# Patient Record
Sex: Female | Born: 1940 | Race: White | Hispanic: No | Smoking: Former smoker
Health system: Southern US, Community
[De-identification: ages and names within clinical notes are randomized; demographics above are authoritative.]

## PROBLEM LIST (undated history)

## (undated) DIAGNOSIS — C801 Malignant (primary) neoplasm, unspecified: Secondary | ICD-10-CM

## (undated) DIAGNOSIS — E119 Type 2 diabetes mellitus without complications: Secondary | ICD-10-CM

## (undated) DIAGNOSIS — Z950 Presence of cardiac pacemaker: Secondary | ICD-10-CM

## (undated) DIAGNOSIS — I495 Sick sinus syndrome: Secondary | ICD-10-CM

## (undated) DIAGNOSIS — F419 Anxiety disorder, unspecified: Secondary | ICD-10-CM

## (undated) DIAGNOSIS — R42 Dizziness and giddiness: Secondary | ICD-10-CM

## (undated) DIAGNOSIS — E079 Disorder of thyroid, unspecified: Secondary | ICD-10-CM

## (undated) DIAGNOSIS — B019 Varicella without complication: Secondary | ICD-10-CM

## (undated) DIAGNOSIS — I480 Paroxysmal atrial fibrillation: Secondary | ICD-10-CM

## (undated) DIAGNOSIS — E039 Hypothyroidism, unspecified: Secondary | ICD-10-CM

## (undated) DIAGNOSIS — E785 Hyperlipidemia, unspecified: Secondary | ICD-10-CM

## (undated) DIAGNOSIS — I10 Essential (primary) hypertension: Secondary | ICD-10-CM

## (undated) DIAGNOSIS — E878 Other disorders of electrolyte and fluid balance, not elsewhere classified: Secondary | ICD-10-CM

## (undated) HISTORY — DX: Essential (primary) hypertension: I10

## (undated) HISTORY — DX: Disorder of thyroid, unspecified: E07.9

## (undated) HISTORY — PX: JOINT REPLACEMENT: SHX530

## (undated) HISTORY — PX: TONSILLECTOMY: SUR1361

## (undated) HISTORY — DX: Paroxysmal atrial fibrillation: I48.0

## (undated) HISTORY — DX: Sick sinus syndrome: I49.5

## (undated) HISTORY — DX: Presence of cardiac pacemaker: Z95.0

---
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
## 2000-12-15 HISTORY — PX: DILATION AND CURETTAGE OF UTERUS: SHX78

## 2006-08-28 DIAGNOSIS — N3946 Mixed incontinence: Secondary | ICD-10-CM | POA: Insufficient documentation

## 2006-08-28 DIAGNOSIS — E1169 Type 2 diabetes mellitus with other specified complication: Secondary | ICD-10-CM | POA: Insufficient documentation

## 2006-08-28 DIAGNOSIS — E7800 Pure hypercholesterolemia, unspecified: Secondary | ICD-10-CM | POA: Insufficient documentation

## 2006-08-28 DIAGNOSIS — I10 Essential (primary) hypertension: Secondary | ICD-10-CM | POA: Insufficient documentation

## 2007-09-15 DIAGNOSIS — L989 Disorder of the skin and subcutaneous tissue, unspecified: Secondary | ICD-10-CM | POA: Insufficient documentation

## 2007-12-10 DIAGNOSIS — G4700 Insomnia, unspecified: Secondary | ICD-10-CM | POA: Insufficient documentation

## 2008-09-18 DIAGNOSIS — C4492 Squamous cell carcinoma of skin, unspecified: Secondary | ICD-10-CM

## 2008-09-18 HISTORY — DX: Squamous cell carcinoma of skin, unspecified: C44.92

## 2008-12-15 HISTORY — PX: THYROIDECTOMY: SHX17

## 2008-12-28 DIAGNOSIS — E041 Nontoxic single thyroid nodule: Secondary | ICD-10-CM | POA: Insufficient documentation

## 2009-01-03 ENCOUNTER — Ambulatory Visit: Payer: Self-pay | Admitting: Unknown Physician Specialty

## 2009-01-18 ENCOUNTER — Ambulatory Visit: Payer: Self-pay | Admitting: Unknown Physician Specialty

## 2009-03-05 ENCOUNTER — Ambulatory Visit: Payer: Self-pay | Admitting: Unknown Physician Specialty

## 2009-03-13 ENCOUNTER — Ambulatory Visit: Payer: Self-pay | Admitting: Unknown Physician Specialty

## 2010-02-20 LAB — HM PAP SMEAR: HM PAP: NORMAL

## 2010-04-10 DIAGNOSIS — R202 Paresthesia of skin: Secondary | ICD-10-CM | POA: Insufficient documentation

## 2010-11-21 ENCOUNTER — Ambulatory Visit: Payer: Self-pay

## 2010-11-27 ENCOUNTER — Ambulatory Visit: Payer: Self-pay | Admitting: Family Medicine

## 2011-10-14 ENCOUNTER — Ambulatory Visit: Payer: Self-pay | Admitting: Unknown Physician Specialty

## 2011-10-14 LAB — HM COLONOSCOPY

## 2011-10-16 LAB — PATHOLOGY REPORT

## 2012-02-03 DIAGNOSIS — D239 Other benign neoplasm of skin, unspecified: Secondary | ICD-10-CM | POA: Diagnosis not present

## 2012-02-03 DIAGNOSIS — L570 Actinic keratosis: Secondary | ICD-10-CM | POA: Diagnosis not present

## 2012-02-03 DIAGNOSIS — Z85828 Personal history of other malignant neoplasm of skin: Secondary | ICD-10-CM | POA: Diagnosis not present

## 2012-02-03 DIAGNOSIS — L821 Other seborrheic keratosis: Secondary | ICD-10-CM | POA: Diagnosis not present

## 2012-03-01 DIAGNOSIS — R209 Unspecified disturbances of skin sensation: Secondary | ICD-10-CM | POA: Diagnosis not present

## 2012-03-01 DIAGNOSIS — E7800 Pure hypercholesterolemia, unspecified: Secondary | ICD-10-CM | POA: Diagnosis not present

## 2012-03-01 DIAGNOSIS — I10 Essential (primary) hypertension: Secondary | ICD-10-CM | POA: Diagnosis not present

## 2012-03-01 DIAGNOSIS — Z0000 Encounter for general adult medical examination without abnormal findings: Secondary | ICD-10-CM | POA: Diagnosis not present

## 2012-03-03 DIAGNOSIS — D126 Benign neoplasm of colon, unspecified: Secondary | ICD-10-CM | POA: Diagnosis not present

## 2012-03-03 DIAGNOSIS — E7800 Pure hypercholesterolemia, unspecified: Secondary | ICD-10-CM | POA: Diagnosis not present

## 2012-03-03 DIAGNOSIS — E041 Nontoxic single thyroid nodule: Secondary | ICD-10-CM | POA: Diagnosis not present

## 2012-03-03 DIAGNOSIS — R209 Unspecified disturbances of skin sensation: Secondary | ICD-10-CM | POA: Diagnosis not present

## 2012-04-08 DIAGNOSIS — Z1231 Encounter for screening mammogram for malignant neoplasm of breast: Secondary | ICD-10-CM | POA: Diagnosis not present

## 2012-04-08 DIAGNOSIS — E2839 Other primary ovarian failure: Secondary | ICD-10-CM | POA: Diagnosis not present

## 2012-04-08 DIAGNOSIS — Z79899 Other long term (current) drug therapy: Secondary | ICD-10-CM | POA: Diagnosis not present

## 2012-04-08 DIAGNOSIS — N951 Menopausal and female climacteric states: Secondary | ICD-10-CM | POA: Diagnosis not present

## 2012-04-08 LAB — HM MAMMOGRAPHY: HM MAMMO: NORMAL

## 2012-04-23 DIAGNOSIS — E041 Nontoxic single thyroid nodule: Secondary | ICD-10-CM | POA: Diagnosis not present

## 2012-05-20 DIAGNOSIS — L089 Local infection of the skin and subcutaneous tissue, unspecified: Secondary | ICD-10-CM | POA: Diagnosis not present

## 2012-05-20 DIAGNOSIS — R21 Rash and other nonspecific skin eruption: Secondary | ICD-10-CM | POA: Diagnosis not present

## 2012-05-20 DIAGNOSIS — L820 Inflamed seborrheic keratosis: Secondary | ICD-10-CM | POA: Diagnosis not present

## 2012-06-03 DIAGNOSIS — H2510 Age-related nuclear cataract, unspecified eye: Secondary | ICD-10-CM | POA: Diagnosis not present

## 2012-08-09 DIAGNOSIS — R209 Unspecified disturbances of skin sensation: Secondary | ICD-10-CM | POA: Diagnosis not present

## 2012-08-09 DIAGNOSIS — Z0000 Encounter for general adult medical examination without abnormal findings: Secondary | ICD-10-CM | POA: Diagnosis not present

## 2012-08-09 DIAGNOSIS — M25569 Pain in unspecified knee: Secondary | ICD-10-CM | POA: Diagnosis not present

## 2012-08-09 DIAGNOSIS — R7309 Other abnormal glucose: Secondary | ICD-10-CM | POA: Diagnosis not present

## 2012-08-18 DIAGNOSIS — M25569 Pain in unspecified knee: Secondary | ICD-10-CM | POA: Diagnosis not present

## 2012-08-18 DIAGNOSIS — IMO0002 Reserved for concepts with insufficient information to code with codable children: Secondary | ICD-10-CM | POA: Diagnosis not present

## 2012-08-18 DIAGNOSIS — M1710 Unilateral primary osteoarthritis, unspecified knee: Secondary | ICD-10-CM | POA: Diagnosis not present

## 2012-08-18 DIAGNOSIS — I10 Essential (primary) hypertension: Secondary | ICD-10-CM | POA: Diagnosis not present

## 2012-08-18 DIAGNOSIS — M238X9 Other internal derangements of unspecified knee: Secondary | ICD-10-CM | POA: Diagnosis not present

## 2012-08-30 DIAGNOSIS — L738 Other specified follicular disorders: Secondary | ICD-10-CM | POA: Diagnosis not present

## 2012-09-01 DIAGNOSIS — R209 Unspecified disturbances of skin sensation: Secondary | ICD-10-CM | POA: Diagnosis not present

## 2012-09-01 DIAGNOSIS — Z23 Encounter for immunization: Secondary | ICD-10-CM | POA: Diagnosis not present

## 2012-09-01 DIAGNOSIS — Z0000 Encounter for general adult medical examination without abnormal findings: Secondary | ICD-10-CM | POA: Diagnosis not present

## 2012-09-14 DIAGNOSIS — I10 Essential (primary) hypertension: Secondary | ICD-10-CM | POA: Diagnosis not present

## 2012-09-14 DIAGNOSIS — M6580 Other synovitis and tenosynovitis, unspecified site: Secondary | ICD-10-CM | POA: Diagnosis not present

## 2012-09-14 DIAGNOSIS — M238X9 Other internal derangements of unspecified knee: Secondary | ICD-10-CM | POA: Diagnosis not present

## 2012-09-14 DIAGNOSIS — M25569 Pain in unspecified knee: Secondary | ICD-10-CM | POA: Diagnosis not present

## 2012-11-01 DIAGNOSIS — M25569 Pain in unspecified knee: Secondary | ICD-10-CM | POA: Diagnosis not present

## 2012-11-01 DIAGNOSIS — IMO0002 Reserved for concepts with insufficient information to code with codable children: Secondary | ICD-10-CM | POA: Diagnosis not present

## 2012-11-01 DIAGNOSIS — I10 Essential (primary) hypertension: Secondary | ICD-10-CM | POA: Diagnosis not present

## 2012-11-01 DIAGNOSIS — M1710 Unilateral primary osteoarthritis, unspecified knee: Secondary | ICD-10-CM | POA: Diagnosis not present

## 2012-11-01 DIAGNOSIS — M238X9 Other internal derangements of unspecified knee: Secondary | ICD-10-CM | POA: Diagnosis not present

## 2012-11-29 DIAGNOSIS — IMO0002 Reserved for concepts with insufficient information to code with codable children: Secondary | ICD-10-CM | POA: Diagnosis not present

## 2012-11-29 DIAGNOSIS — M1710 Unilateral primary osteoarthritis, unspecified knee: Secondary | ICD-10-CM | POA: Diagnosis not present

## 2012-11-29 DIAGNOSIS — I10 Essential (primary) hypertension: Secondary | ICD-10-CM | POA: Diagnosis not present

## 2012-11-29 DIAGNOSIS — M25569 Pain in unspecified knee: Secondary | ICD-10-CM | POA: Diagnosis not present

## 2012-11-29 DIAGNOSIS — M238X9 Other internal derangements of unspecified knee: Secondary | ICD-10-CM | POA: Diagnosis not present

## 2013-02-03 DIAGNOSIS — L821 Other seborrheic keratosis: Secondary | ICD-10-CM | POA: Diagnosis not present

## 2013-02-03 DIAGNOSIS — B009 Herpesviral infection, unspecified: Secondary | ICD-10-CM | POA: Diagnosis not present

## 2013-02-03 DIAGNOSIS — L259 Unspecified contact dermatitis, unspecified cause: Secondary | ICD-10-CM | POA: Diagnosis not present

## 2013-02-03 DIAGNOSIS — L578 Other skin changes due to chronic exposure to nonionizing radiation: Secondary | ICD-10-CM | POA: Diagnosis not present

## 2013-02-03 DIAGNOSIS — L738 Other specified follicular disorders: Secondary | ICD-10-CM | POA: Diagnosis not present

## 2013-02-03 DIAGNOSIS — D485 Neoplasm of uncertain behavior of skin: Secondary | ICD-10-CM | POA: Diagnosis not present

## 2013-02-03 DIAGNOSIS — D239 Other benign neoplasm of skin, unspecified: Secondary | ICD-10-CM | POA: Diagnosis not present

## 2013-02-03 DIAGNOSIS — L570 Actinic keratosis: Secondary | ICD-10-CM | POA: Diagnosis not present

## 2013-02-24 DIAGNOSIS — L570 Actinic keratosis: Secondary | ICD-10-CM | POA: Diagnosis not present

## 2013-02-24 DIAGNOSIS — L578 Other skin changes due to chronic exposure to nonionizing radiation: Secondary | ICD-10-CM | POA: Diagnosis not present

## 2013-03-07 DIAGNOSIS — I10 Essential (primary) hypertension: Secondary | ICD-10-CM | POA: Diagnosis not present

## 2013-03-07 DIAGNOSIS — R7309 Other abnormal glucose: Secondary | ICD-10-CM | POA: Diagnosis not present

## 2013-03-07 DIAGNOSIS — E7800 Pure hypercholesterolemia, unspecified: Secondary | ICD-10-CM | POA: Diagnosis not present

## 2013-03-07 DIAGNOSIS — Z0000 Encounter for general adult medical examination without abnormal findings: Secondary | ICD-10-CM | POA: Diagnosis not present

## 2013-03-15 DIAGNOSIS — E041 Nontoxic single thyroid nodule: Secondary | ICD-10-CM | POA: Diagnosis not present

## 2013-03-15 DIAGNOSIS — R7309 Other abnormal glucose: Secondary | ICD-10-CM | POA: Diagnosis not present

## 2013-03-15 DIAGNOSIS — E7800 Pure hypercholesterolemia, unspecified: Secondary | ICD-10-CM | POA: Diagnosis not present

## 2013-03-15 DIAGNOSIS — I10 Essential (primary) hypertension: Secondary | ICD-10-CM | POA: Diagnosis not present

## 2013-03-31 DIAGNOSIS — M1710 Unilateral primary osteoarthritis, unspecified knee: Secondary | ICD-10-CM | POA: Diagnosis not present

## 2013-04-11 DIAGNOSIS — N6489 Other specified disorders of breast: Secondary | ICD-10-CM | POA: Diagnosis not present

## 2013-04-11 DIAGNOSIS — Z1231 Encounter for screening mammogram for malignant neoplasm of breast: Secondary | ICD-10-CM | POA: Diagnosis not present

## 2013-04-11 DIAGNOSIS — R922 Inconclusive mammogram: Secondary | ICD-10-CM | POA: Diagnosis not present

## 2013-04-18 DIAGNOSIS — R928 Other abnormal and inconclusive findings on diagnostic imaging of breast: Secondary | ICD-10-CM | POA: Diagnosis not present

## 2013-05-31 DIAGNOSIS — L820 Inflamed seborrheic keratosis: Secondary | ICD-10-CM | POA: Diagnosis not present

## 2013-05-31 DIAGNOSIS — L708 Other acne: Secondary | ICD-10-CM | POA: Diagnosis not present

## 2013-05-31 DIAGNOSIS — D239 Other benign neoplasm of skin, unspecified: Secondary | ICD-10-CM | POA: Diagnosis not present

## 2013-05-31 DIAGNOSIS — B009 Herpesviral infection, unspecified: Secondary | ICD-10-CM | POA: Diagnosis not present

## 2013-06-20 DIAGNOSIS — H25019 Cortical age-related cataract, unspecified eye: Secondary | ICD-10-CM | POA: Diagnosis not present

## 2013-07-01 DIAGNOSIS — M1710 Unilateral primary osteoarthritis, unspecified knee: Secondary | ICD-10-CM | POA: Diagnosis not present

## 2013-07-12 ENCOUNTER — Ambulatory Visit: Payer: Self-pay | Admitting: Orthopedic Surgery

## 2013-07-12 DIAGNOSIS — M25469 Effusion, unspecified knee: Secondary | ICD-10-CM | POA: Diagnosis not present

## 2013-07-12 DIAGNOSIS — S83289A Other tear of lateral meniscus, current injury, unspecified knee, initial encounter: Secondary | ICD-10-CM | POA: Diagnosis not present

## 2013-08-19 DIAGNOSIS — M1710 Unilateral primary osteoarthritis, unspecified knee: Secondary | ICD-10-CM | POA: Diagnosis not present

## 2013-08-25 DIAGNOSIS — M2390 Unspecified internal derangement of unspecified knee: Secondary | ICD-10-CM | POA: Diagnosis not present

## 2013-08-29 ENCOUNTER — Ambulatory Visit: Payer: Self-pay | Admitting: Anesthesiology

## 2013-08-29 DIAGNOSIS — I10 Essential (primary) hypertension: Secondary | ICD-10-CM | POA: Diagnosis not present

## 2013-08-29 DIAGNOSIS — Z01810 Encounter for preprocedural cardiovascular examination: Secondary | ICD-10-CM | POA: Diagnosis not present

## 2013-08-29 DIAGNOSIS — S83106A Unspecified dislocation of unspecified knee, initial encounter: Secondary | ICD-10-CM | POA: Diagnosis not present

## 2013-08-29 DIAGNOSIS — Z01812 Encounter for preprocedural laboratory examination: Secondary | ICD-10-CM | POA: Diagnosis not present

## 2013-08-29 LAB — POTASSIUM: Potassium: 3.7 mmol/L (ref 3.5–5.1)

## 2013-08-31 ENCOUNTER — Ambulatory Visit: Payer: Self-pay | Admitting: General Practice

## 2013-08-31 DIAGNOSIS — E669 Obesity, unspecified: Secondary | ICD-10-CM | POA: Diagnosis not present

## 2013-08-31 DIAGNOSIS — M23319 Other meniscus derangements, anterior horn of medial meniscus, unspecified knee: Secondary | ICD-10-CM | POA: Diagnosis not present

## 2013-08-31 DIAGNOSIS — Z79899 Other long term (current) drug therapy: Secondary | ICD-10-CM | POA: Diagnosis not present

## 2013-08-31 DIAGNOSIS — M23349 Other meniscus derangements, anterior horn of lateral meniscus, unspecified knee: Secondary | ICD-10-CM | POA: Diagnosis not present

## 2013-08-31 DIAGNOSIS — M2240 Chondromalacia patellae, unspecified knee: Secondary | ICD-10-CM | POA: Diagnosis not present

## 2013-08-31 DIAGNOSIS — Z87891 Personal history of nicotine dependence: Secondary | ICD-10-CM | POA: Diagnosis not present

## 2013-08-31 DIAGNOSIS — I10 Essential (primary) hypertension: Secondary | ICD-10-CM | POA: Diagnosis not present

## 2013-08-31 DIAGNOSIS — S83289A Other tear of lateral meniscus, current injury, unspecified knee, initial encounter: Secondary | ICD-10-CM | POA: Diagnosis not present

## 2013-08-31 DIAGNOSIS — M9420 Chondromalacia, unspecified site: Secondary | ICD-10-CM | POA: Diagnosis not present

## 2013-08-31 DIAGNOSIS — M23359 Other meniscus derangements, posterior horn of lateral meniscus, unspecified knee: Secondary | ICD-10-CM | POA: Diagnosis not present

## 2013-08-31 DIAGNOSIS — M129 Arthropathy, unspecified: Secondary | ICD-10-CM | POA: Diagnosis not present

## 2013-08-31 DIAGNOSIS — E785 Hyperlipidemia, unspecified: Secondary | ICD-10-CM | POA: Diagnosis not present

## 2013-08-31 DIAGNOSIS — Z6837 Body mass index (BMI) 37.0-37.9, adult: Secondary | ICD-10-CM | POA: Diagnosis not present

## 2013-08-31 DIAGNOSIS — E039 Hypothyroidism, unspecified: Secondary | ICD-10-CM | POA: Diagnosis not present

## 2013-09-24 DIAGNOSIS — Z23 Encounter for immunization: Secondary | ICD-10-CM | POA: Diagnosis not present

## 2013-09-24 DIAGNOSIS — E7800 Pure hypercholesterolemia, unspecified: Secondary | ICD-10-CM | POA: Diagnosis not present

## 2013-09-24 DIAGNOSIS — R7309 Other abnormal glucose: Secondary | ICD-10-CM | POA: Diagnosis not present

## 2013-09-24 DIAGNOSIS — I10 Essential (primary) hypertension: Secondary | ICD-10-CM | POA: Diagnosis not present

## 2013-09-24 DIAGNOSIS — Z0000 Encounter for general adult medical examination without abnormal findings: Secondary | ICD-10-CM | POA: Diagnosis not present

## 2013-10-17 DIAGNOSIS — M6281 Muscle weakness (generalized): Secondary | ICD-10-CM | POA: Diagnosis not present

## 2013-10-17 DIAGNOSIS — M25569 Pain in unspecified knee: Secondary | ICD-10-CM | POA: Diagnosis not present

## 2013-10-17 DIAGNOSIS — M25669 Stiffness of unspecified knee, not elsewhere classified: Secondary | ICD-10-CM | POA: Diagnosis not present

## 2013-10-20 DIAGNOSIS — M1710 Unilateral primary osteoarthritis, unspecified knee: Secondary | ICD-10-CM | POA: Diagnosis not present

## 2013-10-24 DIAGNOSIS — M6281 Muscle weakness (generalized): Secondary | ICD-10-CM | POA: Diagnosis not present

## 2013-10-24 DIAGNOSIS — M25569 Pain in unspecified knee: Secondary | ICD-10-CM | POA: Diagnosis not present

## 2013-10-24 DIAGNOSIS — M25669 Stiffness of unspecified knee, not elsewhere classified: Secondary | ICD-10-CM | POA: Diagnosis not present

## 2013-10-26 DIAGNOSIS — M6281 Muscle weakness (generalized): Secondary | ICD-10-CM | POA: Diagnosis not present

## 2013-10-26 DIAGNOSIS — M25669 Stiffness of unspecified knee, not elsewhere classified: Secondary | ICD-10-CM | POA: Diagnosis not present

## 2013-10-26 DIAGNOSIS — M25569 Pain in unspecified knee: Secondary | ICD-10-CM | POA: Diagnosis not present

## 2013-10-27 DIAGNOSIS — M1710 Unilateral primary osteoarthritis, unspecified knee: Secondary | ICD-10-CM | POA: Diagnosis not present

## 2013-10-31 DIAGNOSIS — M25569 Pain in unspecified knee: Secondary | ICD-10-CM | POA: Diagnosis not present

## 2013-10-31 DIAGNOSIS — M25669 Stiffness of unspecified knee, not elsewhere classified: Secondary | ICD-10-CM | POA: Diagnosis not present

## 2013-10-31 DIAGNOSIS — M6281 Muscle weakness (generalized): Secondary | ICD-10-CM | POA: Diagnosis not present

## 2013-11-01 DIAGNOSIS — L578 Other skin changes due to chronic exposure to nonionizing radiation: Secondary | ICD-10-CM | POA: Diagnosis not present

## 2013-11-01 DIAGNOSIS — L821 Other seborrheic keratosis: Secondary | ICD-10-CM | POA: Diagnosis not present

## 2013-11-01 DIAGNOSIS — D485 Neoplasm of uncertain behavior of skin: Secondary | ICD-10-CM | POA: Diagnosis not present

## 2013-11-01 DIAGNOSIS — B079 Viral wart, unspecified: Secondary | ICD-10-CM | POA: Diagnosis not present

## 2013-11-01 DIAGNOSIS — L820 Inflamed seborrheic keratosis: Secondary | ICD-10-CM | POA: Diagnosis not present

## 2013-11-01 DIAGNOSIS — Z85828 Personal history of other malignant neoplasm of skin: Secondary | ICD-10-CM | POA: Diagnosis not present

## 2013-11-02 DIAGNOSIS — M6281 Muscle weakness (generalized): Secondary | ICD-10-CM | POA: Diagnosis not present

## 2013-11-02 DIAGNOSIS — M25669 Stiffness of unspecified knee, not elsewhere classified: Secondary | ICD-10-CM | POA: Diagnosis not present

## 2013-11-02 DIAGNOSIS — M25569 Pain in unspecified knee: Secondary | ICD-10-CM | POA: Diagnosis not present

## 2013-11-04 DIAGNOSIS — M1710 Unilateral primary osteoarthritis, unspecified knee: Secondary | ICD-10-CM | POA: Diagnosis not present

## 2013-11-08 DIAGNOSIS — M25669 Stiffness of unspecified knee, not elsewhere classified: Secondary | ICD-10-CM | POA: Diagnosis not present

## 2013-11-08 DIAGNOSIS — M6281 Muscle weakness (generalized): Secondary | ICD-10-CM | POA: Diagnosis not present

## 2013-11-08 DIAGNOSIS — M25569 Pain in unspecified knee: Secondary | ICD-10-CM | POA: Diagnosis not present

## 2013-11-15 DIAGNOSIS — M25669 Stiffness of unspecified knee, not elsewhere classified: Secondary | ICD-10-CM | POA: Diagnosis not present

## 2013-11-15 DIAGNOSIS — M25569 Pain in unspecified knee: Secondary | ICD-10-CM | POA: Diagnosis not present

## 2013-11-15 DIAGNOSIS — M6281 Muscle weakness (generalized): Secondary | ICD-10-CM | POA: Diagnosis not present

## 2013-11-18 DIAGNOSIS — M25569 Pain in unspecified knee: Secondary | ICD-10-CM | POA: Diagnosis not present

## 2013-11-18 DIAGNOSIS — M25669 Stiffness of unspecified knee, not elsewhere classified: Secondary | ICD-10-CM | POA: Diagnosis not present

## 2013-11-18 DIAGNOSIS — M6281 Muscle weakness (generalized): Secondary | ICD-10-CM | POA: Diagnosis not present

## 2013-11-22 DIAGNOSIS — M25669 Stiffness of unspecified knee, not elsewhere classified: Secondary | ICD-10-CM | POA: Diagnosis not present

## 2013-11-22 DIAGNOSIS — M25569 Pain in unspecified knee: Secondary | ICD-10-CM | POA: Diagnosis not present

## 2013-11-25 DIAGNOSIS — M6281 Muscle weakness (generalized): Secondary | ICD-10-CM | POA: Diagnosis not present

## 2013-11-25 DIAGNOSIS — M25669 Stiffness of unspecified knee, not elsewhere classified: Secondary | ICD-10-CM | POA: Diagnosis not present

## 2013-11-25 DIAGNOSIS — M25569 Pain in unspecified knee: Secondary | ICD-10-CM | POA: Diagnosis not present

## 2013-12-15 HISTORY — PX: TOTAL KNEE ARTHROPLASTY: SHX125

## 2013-12-23 DIAGNOSIS — IMO0002 Reserved for concepts with insufficient information to code with codable children: Secondary | ICD-10-CM | POA: Diagnosis not present

## 2013-12-23 DIAGNOSIS — M1710 Unilateral primary osteoarthritis, unspecified knee: Secondary | ICD-10-CM | POA: Diagnosis not present

## 2014-01-05 DIAGNOSIS — IMO0002 Reserved for concepts with insufficient information to code with codable children: Secondary | ICD-10-CM | POA: Diagnosis not present

## 2014-01-05 DIAGNOSIS — M1710 Unilateral primary osteoarthritis, unspecified knee: Secondary | ICD-10-CM | POA: Diagnosis not present

## 2014-02-06 DIAGNOSIS — L578 Other skin changes due to chronic exposure to nonionizing radiation: Secondary | ICD-10-CM | POA: Diagnosis not present

## 2014-02-06 DIAGNOSIS — L608 Other nail disorders: Secondary | ICD-10-CM | POA: Diagnosis not present

## 2014-02-06 DIAGNOSIS — L820 Inflamed seborrheic keratosis: Secondary | ICD-10-CM | POA: Diagnosis not present

## 2014-02-06 DIAGNOSIS — D239 Other benign neoplasm of skin, unspecified: Secondary | ICD-10-CM | POA: Diagnosis not present

## 2014-02-06 DIAGNOSIS — B359 Dermatophytosis, unspecified: Secondary | ICD-10-CM | POA: Diagnosis not present

## 2014-02-06 DIAGNOSIS — L821 Other seborrheic keratosis: Secondary | ICD-10-CM | POA: Diagnosis not present

## 2014-02-06 DIAGNOSIS — Z85828 Personal history of other malignant neoplasm of skin: Secondary | ICD-10-CM | POA: Diagnosis not present

## 2014-02-08 ENCOUNTER — Ambulatory Visit: Payer: Self-pay | Admitting: General Practice

## 2014-02-08 DIAGNOSIS — D62 Acute posthemorrhagic anemia: Secondary | ICD-10-CM | POA: Diagnosis not present

## 2014-02-08 DIAGNOSIS — IMO0002 Reserved for concepts with insufficient information to code with codable children: Secondary | ICD-10-CM | POA: Diagnosis not present

## 2014-02-08 DIAGNOSIS — Z79899 Other long term (current) drug therapy: Secondary | ICD-10-CM | POA: Diagnosis not present

## 2014-02-08 DIAGNOSIS — Z01812 Encounter for preprocedural laboratory examination: Secondary | ICD-10-CM | POA: Diagnosis not present

## 2014-02-08 DIAGNOSIS — E785 Hyperlipidemia, unspecified: Secondary | ICD-10-CM | POA: Diagnosis not present

## 2014-02-08 DIAGNOSIS — E039 Hypothyroidism, unspecified: Secondary | ICD-10-CM | POA: Diagnosis not present

## 2014-02-08 DIAGNOSIS — I10 Essential (primary) hypertension: Secondary | ICD-10-CM | POA: Diagnosis not present

## 2014-02-08 LAB — BASIC METABOLIC PANEL
Anion Gap: 1 — ABNORMAL LOW (ref 7–16)
BUN: 14 mg/dL (ref 7–18)
CHLORIDE: 105 mmol/L (ref 98–107)
Calcium, Total: 9 mg/dL (ref 8.5–10.1)
Co2: 32 mmol/L (ref 21–32)
Creatinine: 0.85 mg/dL (ref 0.60–1.30)
EGFR (African American): >60
GLUCOSE: 97 mg/dL (ref 65–99)
Osmolality: 276 (ref 275–301)
Potassium: 3.8 mmol/L (ref 3.5–5.1)
SODIUM: 138 mmol/L (ref 136–145)

## 2014-02-08 LAB — URINALYSIS, COMPLETE
BILIRUBIN, UR: NEGATIVE
Blood: NEGATIVE
GLUCOSE, UR: NEGATIVE mg/dL (ref 0–75)
Ketone: NEGATIVE
Leukocyte Esterase: NEGATIVE
Nitrite: NEGATIVE
PH: 7 (ref 4.5–8.0)
Protein: NEGATIVE
RBC,UR: NONE SEEN /HPF (ref 0–5)
Specific Gravity: 1.005 (ref 1.003–1.030)

## 2014-02-08 LAB — CBC
HCT: 38 % (ref 35.0–47.0)
HGB: 13.1 g/dL (ref 12.0–16.0)
MCH: 30.2 pg (ref 26.0–34.0)
MCHC: 34.5 g/dL (ref 32.0–36.0)
MCV: 88 fL (ref 80–100)
PLATELETS: 215 10*3/uL (ref 150–440)
RBC: 4.33 10*6/uL (ref 3.80–5.20)
RDW: 14.1 % (ref 11.5–14.5)
WBC: 8.7 10*3/uL (ref 3.6–11.0)

## 2014-02-08 LAB — PROTIME-INR
INR: 1
Prothrombin Time: 13.1 secs (ref 11.5–14.7)

## 2014-02-08 LAB — APTT: Activated PTT: 27.5 secs (ref 23.6–35.9)

## 2014-02-08 LAB — SEDIMENTATION RATE: ERYTHROCYTE SED RATE: 41 mm/h — AB (ref 0–30)

## 2014-02-08 LAB — MRSA PCR SCREENING

## 2014-02-09 LAB — URINE CULTURE

## 2014-02-20 ENCOUNTER — Inpatient Hospital Stay: Payer: Self-pay | Admitting: General Practice

## 2014-02-20 DIAGNOSIS — I10 Essential (primary) hypertension: Secondary | ICD-10-CM | POA: Diagnosis present

## 2014-02-20 DIAGNOSIS — Z96659 Presence of unspecified artificial knee joint: Secondary | ICD-10-CM | POA: Diagnosis not present

## 2014-02-20 DIAGNOSIS — M1710 Unilateral primary osteoarthritis, unspecified knee: Secondary | ICD-10-CM | POA: Diagnosis not present

## 2014-02-20 DIAGNOSIS — E039 Hypothyroidism, unspecified: Secondary | ICD-10-CM | POA: Diagnosis present

## 2014-02-20 DIAGNOSIS — Z471 Aftercare following joint replacement surgery: Secondary | ICD-10-CM | POA: Diagnosis not present

## 2014-02-20 DIAGNOSIS — E785 Hyperlipidemia, unspecified: Secondary | ICD-10-CM | POA: Diagnosis present

## 2014-02-20 DIAGNOSIS — D62 Acute posthemorrhagic anemia: Secondary | ICD-10-CM | POA: Diagnosis not present

## 2014-02-20 DIAGNOSIS — IMO0002 Reserved for concepts with insufficient information to code with codable children: Secondary | ICD-10-CM | POA: Diagnosis not present

## 2014-02-20 HISTORY — PX: REPLACEMENT TOTAL KNEE: SUR1224

## 2014-02-21 LAB — PLATELET COUNT: PLATELETS: 205 10*3/uL (ref 150–440)

## 2014-02-21 LAB — BASIC METABOLIC PANEL
Anion Gap: 6 — ABNORMAL LOW (ref 7–16)
BUN: 14 mg/dL (ref 7–18)
Calcium, Total: 7.8 mg/dL — ABNORMAL LOW (ref 8.5–10.1)
Chloride: 105 mmol/L (ref 98–107)
Co2: 27 mmol/L (ref 21–32)
Creatinine: 0.89 mg/dL (ref 0.60–1.30)
EGFR (African American): >60
GLUCOSE: 107 mg/dL — AB (ref 65–99)
Osmolality: 277 (ref 275–301)
POTASSIUM: 4 mmol/L (ref 3.5–5.1)
Sodium: 138 mmol/L (ref 136–145)

## 2014-02-21 LAB — HEMOGLOBIN: HGB: 11.3 g/dL — ABNORMAL LOW (ref 12.0–16.0)

## 2014-02-22 LAB — BASIC METABOLIC PANEL
Anion Gap: 6 — ABNORMAL LOW (ref 7–16)
BUN: 9 mg/dL (ref 7–18)
CO2: 25 mmol/L (ref 21–32)
Calcium, Total: 8.2 mg/dL — ABNORMAL LOW (ref 8.5–10.1)
Chloride: 103 mmol/L (ref 98–107)
Creatinine: 0.58 mg/dL — ABNORMAL LOW (ref 0.60–1.30)
EGFR (Non-African Amer.): >60
GLUCOSE: 103 mg/dL — AB (ref 65–99)
OSMOLALITY: 267 (ref 275–301)
Potassium: 3.9 mmol/L (ref 3.5–5.1)
SODIUM: 134 mmol/L — AB (ref 136–145)

## 2014-02-22 LAB — HEMOGLOBIN: HGB: 10.7 g/dL — AB (ref 12.0–16.0)

## 2014-02-22 LAB — PLATELET COUNT: Platelet: 195 10*3/uL (ref 150–440)

## 2014-02-24 DIAGNOSIS — IMO0001 Reserved for inherently not codable concepts without codable children: Secondary | ICD-10-CM | POA: Diagnosis not present

## 2014-02-24 DIAGNOSIS — Z96659 Presence of unspecified artificial knee joint: Secondary | ICD-10-CM | POA: Diagnosis not present

## 2014-02-24 DIAGNOSIS — Z471 Aftercare following joint replacement surgery: Secondary | ICD-10-CM | POA: Diagnosis not present

## 2014-02-27 DIAGNOSIS — Z471 Aftercare following joint replacement surgery: Secondary | ICD-10-CM | POA: Diagnosis not present

## 2014-02-27 DIAGNOSIS — IMO0001 Reserved for inherently not codable concepts without codable children: Secondary | ICD-10-CM | POA: Diagnosis not present

## 2014-02-27 DIAGNOSIS — Z96659 Presence of unspecified artificial knee joint: Secondary | ICD-10-CM | POA: Diagnosis not present

## 2014-02-28 DIAGNOSIS — Z96659 Presence of unspecified artificial knee joint: Secondary | ICD-10-CM | POA: Diagnosis not present

## 2014-02-28 DIAGNOSIS — IMO0001 Reserved for inherently not codable concepts without codable children: Secondary | ICD-10-CM | POA: Diagnosis not present

## 2014-02-28 DIAGNOSIS — Z471 Aftercare following joint replacement surgery: Secondary | ICD-10-CM | POA: Diagnosis not present

## 2014-03-01 DIAGNOSIS — Z471 Aftercare following joint replacement surgery: Secondary | ICD-10-CM | POA: Diagnosis not present

## 2014-03-01 DIAGNOSIS — Z96659 Presence of unspecified artificial knee joint: Secondary | ICD-10-CM | POA: Diagnosis not present

## 2014-03-01 DIAGNOSIS — IMO0001 Reserved for inherently not codable concepts without codable children: Secondary | ICD-10-CM | POA: Diagnosis not present

## 2014-03-02 DIAGNOSIS — Z96659 Presence of unspecified artificial knee joint: Secondary | ICD-10-CM | POA: Diagnosis not present

## 2014-03-02 DIAGNOSIS — IMO0001 Reserved for inherently not codable concepts without codable children: Secondary | ICD-10-CM | POA: Diagnosis not present

## 2014-03-02 DIAGNOSIS — Z471 Aftercare following joint replacement surgery: Secondary | ICD-10-CM | POA: Diagnosis not present

## 2014-03-03 DIAGNOSIS — IMO0001 Reserved for inherently not codable concepts without codable children: Secondary | ICD-10-CM | POA: Diagnosis not present

## 2014-03-03 DIAGNOSIS — Z471 Aftercare following joint replacement surgery: Secondary | ICD-10-CM | POA: Diagnosis not present

## 2014-03-03 DIAGNOSIS — Z96659 Presence of unspecified artificial knee joint: Secondary | ICD-10-CM | POA: Diagnosis not present

## 2014-03-06 DIAGNOSIS — Z471 Aftercare following joint replacement surgery: Secondary | ICD-10-CM | POA: Diagnosis not present

## 2014-03-06 DIAGNOSIS — Z96659 Presence of unspecified artificial knee joint: Secondary | ICD-10-CM | POA: Diagnosis not present

## 2014-03-06 DIAGNOSIS — IMO0001 Reserved for inherently not codable concepts without codable children: Secondary | ICD-10-CM | POA: Diagnosis not present

## 2014-03-07 DIAGNOSIS — R262 Difficulty in walking, not elsewhere classified: Secondary | ICD-10-CM | POA: Diagnosis not present

## 2014-03-07 DIAGNOSIS — M6281 Muscle weakness (generalized): Secondary | ICD-10-CM | POA: Diagnosis not present

## 2014-03-07 DIAGNOSIS — M25669 Stiffness of unspecified knee, not elsewhere classified: Secondary | ICD-10-CM | POA: Diagnosis not present

## 2014-03-07 DIAGNOSIS — M25569 Pain in unspecified knee: Secondary | ICD-10-CM | POA: Diagnosis not present

## 2014-03-10 DIAGNOSIS — M6281 Muscle weakness (generalized): Secondary | ICD-10-CM | POA: Diagnosis not present

## 2014-03-10 DIAGNOSIS — M25569 Pain in unspecified knee: Secondary | ICD-10-CM | POA: Diagnosis not present

## 2014-03-10 DIAGNOSIS — M25669 Stiffness of unspecified knee, not elsewhere classified: Secondary | ICD-10-CM | POA: Diagnosis not present

## 2014-03-10 DIAGNOSIS — R262 Difficulty in walking, not elsewhere classified: Secondary | ICD-10-CM | POA: Diagnosis not present

## 2014-03-14 DIAGNOSIS — R262 Difficulty in walking, not elsewhere classified: Secondary | ICD-10-CM | POA: Diagnosis not present

## 2014-03-14 DIAGNOSIS — M25669 Stiffness of unspecified knee, not elsewhere classified: Secondary | ICD-10-CM | POA: Diagnosis not present

## 2014-03-14 DIAGNOSIS — M25569 Pain in unspecified knee: Secondary | ICD-10-CM | POA: Diagnosis not present

## 2014-03-14 DIAGNOSIS — M6281 Muscle weakness (generalized): Secondary | ICD-10-CM | POA: Diagnosis not present

## 2014-03-16 DIAGNOSIS — R262 Difficulty in walking, not elsewhere classified: Secondary | ICD-10-CM | POA: Diagnosis not present

## 2014-03-16 DIAGNOSIS — M6281 Muscle weakness (generalized): Secondary | ICD-10-CM | POA: Diagnosis not present

## 2014-03-16 DIAGNOSIS — M25669 Stiffness of unspecified knee, not elsewhere classified: Secondary | ICD-10-CM | POA: Diagnosis not present

## 2014-03-16 DIAGNOSIS — M25569 Pain in unspecified knee: Secondary | ICD-10-CM | POA: Diagnosis not present

## 2014-03-20 DIAGNOSIS — M25569 Pain in unspecified knee: Secondary | ICD-10-CM | POA: Diagnosis not present

## 2014-03-20 DIAGNOSIS — M6281 Muscle weakness (generalized): Secondary | ICD-10-CM | POA: Diagnosis not present

## 2014-03-20 DIAGNOSIS — M25669 Stiffness of unspecified knee, not elsewhere classified: Secondary | ICD-10-CM | POA: Diagnosis not present

## 2014-03-20 DIAGNOSIS — R262 Difficulty in walking, not elsewhere classified: Secondary | ICD-10-CM | POA: Diagnosis not present

## 2014-03-23 DIAGNOSIS — R262 Difficulty in walking, not elsewhere classified: Secondary | ICD-10-CM | POA: Diagnosis not present

## 2014-03-23 DIAGNOSIS — M25569 Pain in unspecified knee: Secondary | ICD-10-CM | POA: Diagnosis not present

## 2014-03-23 DIAGNOSIS — M6281 Muscle weakness (generalized): Secondary | ICD-10-CM | POA: Diagnosis not present

## 2014-03-23 DIAGNOSIS — M25669 Stiffness of unspecified knee, not elsewhere classified: Secondary | ICD-10-CM | POA: Diagnosis not present

## 2014-03-27 DIAGNOSIS — M6281 Muscle weakness (generalized): Secondary | ICD-10-CM | POA: Diagnosis not present

## 2014-03-27 DIAGNOSIS — M25569 Pain in unspecified knee: Secondary | ICD-10-CM | POA: Diagnosis not present

## 2014-03-27 DIAGNOSIS — M25669 Stiffness of unspecified knee, not elsewhere classified: Secondary | ICD-10-CM | POA: Diagnosis not present

## 2014-03-27 DIAGNOSIS — R262 Difficulty in walking, not elsewhere classified: Secondary | ICD-10-CM | POA: Diagnosis not present

## 2014-03-30 DIAGNOSIS — R262 Difficulty in walking, not elsewhere classified: Secondary | ICD-10-CM | POA: Diagnosis not present

## 2014-03-30 DIAGNOSIS — M6281 Muscle weakness (generalized): Secondary | ICD-10-CM | POA: Diagnosis not present

## 2014-03-30 DIAGNOSIS — M25669 Stiffness of unspecified knee, not elsewhere classified: Secondary | ICD-10-CM | POA: Diagnosis not present

## 2014-03-30 DIAGNOSIS — M25569 Pain in unspecified knee: Secondary | ICD-10-CM | POA: Diagnosis not present

## 2014-04-03 DIAGNOSIS — M25669 Stiffness of unspecified knee, not elsewhere classified: Secondary | ICD-10-CM | POA: Diagnosis not present

## 2014-04-03 DIAGNOSIS — M6281 Muscle weakness (generalized): Secondary | ICD-10-CM | POA: Diagnosis not present

## 2014-04-04 DIAGNOSIS — Z96659 Presence of unspecified artificial knee joint: Secondary | ICD-10-CM | POA: Diagnosis not present

## 2014-04-04 DIAGNOSIS — Z471 Aftercare following joint replacement surgery: Secondary | ICD-10-CM | POA: Diagnosis not present

## 2014-04-12 DIAGNOSIS — E7800 Pure hypercholesterolemia, unspecified: Secondary | ICD-10-CM | POA: Diagnosis not present

## 2014-04-12 DIAGNOSIS — Z0000 Encounter for general adult medical examination without abnormal findings: Secondary | ICD-10-CM | POA: Diagnosis not present

## 2014-04-12 DIAGNOSIS — R7309 Other abnormal glucose: Secondary | ICD-10-CM | POA: Diagnosis not present

## 2014-04-14 DIAGNOSIS — R7309 Other abnormal glucose: Secondary | ICD-10-CM | POA: Diagnosis not present

## 2014-04-14 DIAGNOSIS — E7800 Pure hypercholesterolemia, unspecified: Secondary | ICD-10-CM | POA: Diagnosis not present

## 2014-04-14 DIAGNOSIS — I10 Essential (primary) hypertension: Secondary | ICD-10-CM | POA: Diagnosis not present

## 2014-04-24 DIAGNOSIS — N6489 Other specified disorders of breast: Secondary | ICD-10-CM | POA: Diagnosis not present

## 2014-04-24 DIAGNOSIS — Z1231 Encounter for screening mammogram for malignant neoplasm of breast: Secondary | ICD-10-CM | POA: Diagnosis not present

## 2014-05-19 DIAGNOSIS — I10 Essential (primary) hypertension: Secondary | ICD-10-CM | POA: Diagnosis not present

## 2014-05-19 DIAGNOSIS — R7309 Other abnormal glucose: Secondary | ICD-10-CM | POA: Diagnosis not present

## 2014-05-19 DIAGNOSIS — E7800 Pure hypercholesterolemia, unspecified: Secondary | ICD-10-CM | POA: Diagnosis not present

## 2014-06-27 DIAGNOSIS — H25019 Cortical age-related cataract, unspecified eye: Secondary | ICD-10-CM | POA: Diagnosis not present

## 2014-07-04 DIAGNOSIS — Z96659 Presence of unspecified artificial knee joint: Secondary | ICD-10-CM | POA: Diagnosis not present

## 2014-10-05 DIAGNOSIS — Z96651 Presence of right artificial knee joint: Secondary | ICD-10-CM | POA: Diagnosis not present

## 2014-10-30 DIAGNOSIS — Z23 Encounter for immunization: Secondary | ICD-10-CM | POA: Diagnosis not present

## 2015-01-30 DIAGNOSIS — E780 Pure hypercholesterolemia: Secondary | ICD-10-CM | POA: Diagnosis not present

## 2015-02-07 DIAGNOSIS — Z1283 Encounter for screening for malignant neoplasm of skin: Secondary | ICD-10-CM | POA: Diagnosis not present

## 2015-02-07 DIAGNOSIS — D229 Melanocytic nevi, unspecified: Secondary | ICD-10-CM | POA: Diagnosis not present

## 2015-02-07 DIAGNOSIS — L578 Other skin changes due to chronic exposure to nonionizing radiation: Secondary | ICD-10-CM | POA: Diagnosis not present

## 2015-02-07 DIAGNOSIS — I8390 Asymptomatic varicose veins of unspecified lower extremity: Secondary | ICD-10-CM | POA: Diagnosis not present

## 2015-02-07 DIAGNOSIS — I781 Nevus, non-neoplastic: Secondary | ICD-10-CM | POA: Diagnosis not present

## 2015-02-07 DIAGNOSIS — L821 Other seborrheic keratosis: Secondary | ICD-10-CM | POA: Diagnosis not present

## 2015-02-07 DIAGNOSIS — D2339 Other benign neoplasm of skin of other parts of face: Secondary | ICD-10-CM | POA: Diagnosis not present

## 2015-02-07 DIAGNOSIS — Z85828 Personal history of other malignant neoplasm of skin: Secondary | ICD-10-CM | POA: Diagnosis not present

## 2015-02-07 DIAGNOSIS — D2372 Other benign neoplasm of skin of left lower limb, including hip: Secondary | ICD-10-CM | POA: Diagnosis not present

## 2015-02-07 DIAGNOSIS — D485 Neoplasm of uncertain behavior of skin: Secondary | ICD-10-CM | POA: Diagnosis not present

## 2015-02-07 DIAGNOSIS — L814 Other melanin hyperpigmentation: Secondary | ICD-10-CM | POA: Diagnosis not present

## 2015-02-07 DIAGNOSIS — L820 Inflamed seborrheic keratosis: Secondary | ICD-10-CM | POA: Diagnosis not present

## 2015-03-20 DIAGNOSIS — M7062 Trochanteric bursitis, left hip: Secondary | ICD-10-CM | POA: Diagnosis not present

## 2015-03-20 DIAGNOSIS — Z96651 Presence of right artificial knee joint: Secondary | ICD-10-CM | POA: Diagnosis not present

## 2015-03-22 DIAGNOSIS — L821 Other seborrheic keratosis: Secondary | ICD-10-CM | POA: Diagnosis not present

## 2015-03-22 DIAGNOSIS — L820 Inflamed seborrheic keratosis: Secondary | ICD-10-CM | POA: Diagnosis not present

## 2015-03-22 DIAGNOSIS — D239 Other benign neoplasm of skin, unspecified: Secondary | ICD-10-CM | POA: Diagnosis not present

## 2015-04-06 NOTE — Op Note (Signed)
PATIENT NAME:  Wendy Barnes, Wendy Barnes MR#:  [id number] DATE OF BIRTH:  10/27/1941  DATE OF PROCEDURE:  08/31/2013  PREOPERATIVE DIAGNOSIS: Internal derangement of the right knee.   POSTOPERATIVE DIAGNOSES: 1.  Tear of the anterior horn medial meniscus, right knee.  2.  Tear of the anterior and posterior horns of the lateral meniscus, right knee.  3.  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  4.  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   PROCEDURES PERFORMED: Right knee arthroscopy, partial medial and lateral meniscectomies and chondroplasty of medial, lateral and patellofemoral compartments.   SURGEON: Laurice Record. Hooten, MD   ANESTHESIA: General.   ESTIMATED BLOOD LOSS: Minimal.   TOURNIQUET TIME: Not used.   DRAINS: None.   INDICATIONS FOR SURGERY: The patient is a 74 year old female who has been seen for complaints of severe right knee pain. MRI demonstrated findings consistent with meniscal pathology as well as some chondral changes. After discussion of the risks and benefits of surgical intervention, the patient expressed understanding of the risks and benefits and agreed with plans for surgical intervention.   PROCEDURE IN DETAIL: The patient was brought into the operating room and, after adequate general anesthesia was achieved, a tourniquet was placed on the patient's right thigh and leg was placed in a leg holder. All bony prominences were well padded. The patient's right knee and leg were cleaned and prepped with alcohol and DuraPrep and draped in the usual sterile fashion. A "timeout" was performed as per usual protocol. The anticipated portal sites were injected with 0.25% Marcaine with epinephrine. An anterolateral portal was created and a cannula was inserted. A small effusion was evacuated. The scope was inserted and the knee was distended with fluid using the Stryker pump. The scope was advanced down the medial gutter  into the medial compartment of the knee. Under visualization with the scope, an anteromedial portal was created. Inspection of the medial compartment showed the posterior horn of the medial meniscus to be intact. The medial tibial plateau showed the articular surface to be in good condition. There were changes of grade III chondromalacia involving the medial femoral condyle and these areas were debrided using the 50 degree ArthroCare wand. The anterior horn of the medial meniscus demonstrated a degenerative-type tear, and this was debrided using the 4.5 mm shaver with final contouring using the 50 degrees ArthroCare wand. Scope was then advanced into the intracondylar region. The anterior cruciate ligament was visualized and probed and felt to be stable. The scope was removed from the anterolateral portal and reinserted via the anteromedial portal so as to better visualize the lateral compartment. A large complex degenerative tear of the anterior horn of the lateral meniscus was encountered. The anterior horn was extensively debrided using a 4.5 mm incisor shaver and further contoured using the 50 degrees ArthroCare wand, taking special care to allow for smooth transition zone to intact meniscus. The tear did extend to posterior margin and this area was debrided as well. Inspection of the articular cartilage in the lateral compartment demonstrated grade IV changes of chondromalacia involving the medial femoral condyle as well as the medial tibial plateau. These areas were debrided and contoured using the ArthroCare wand. Finally, the scope was positioned so as to visualize the patellofemoral articulation. Reasonably good patellar tracking was noted. There were some grade III changes of chondromalacia involving the trochlear groove. These were debrided and contoured using the ArthroCare wand.   The knee was irrigated with copious  amounts of fluid and then suctioned dry. The anterolateral portal was reapproximated  using 3-0 nylon. A combination of 0.25% Marcaine with epinephrine and 4 mg morphine was injected via the scope. The scope was removed and the anteromedial portal was reapproximated using 3-0 nylon. Sterile dressing was applied followed by application of an ice wrap.   The patient tolerated the procedure well. She was transported to the recovery room in stable condition.   ____________________________ Laurice Record. Holley Bouche., MD jph:cc D: 08/31/2013 18:28:19 ET T: 08/31/2013 23:59:35 ET JOB#: [id number]  cc: Laurice Record. Holley Bouche., MD, <Dictator> JAMES P Holley Bouche MD ELECTRONICALLY SIGNED 09/17/2013 9:17

## 2015-04-07 NOTE — Discharge Summary (Signed)
PATIENT NAME:  Wendy Barnes, Wendy Barnes MR#:  [id number] DATE OF BIRTH:  1941-02-09  DATE OF ADMISSION:  02/20/2014 DATE OF DISCHARGE:  02/23/2014  ADMITTING DIAGNOSIS: Degenerative arthrosis of the right knee.   DISCHARGE DIAGNOSIS: Degenerative arthrosis of the right knee.   HISTORY: The patient is a 74 year old, who has been followed at [facility] for progression of bilateral knee pain with the right being more symptomatic than the left. The patient had not seen any significant improvement in her condition despite Synvisc injections. She states that she actually had increased pain to the right knee after undergoing a trial of physical therapy. The patient localized most of the pain along the lateral aspect of the knee. She had reported some near giving way of the knee. She was having difficulty with ascending and descending stairs. The patient states that the pain had increased to the point that she had gone to using a cane due to the severity of pain and instability. She states that the pain had progressed to the point that it was significantly interfering with her activities of daily living. X-rays taken in [place] showed narrowing of the lateral cartilage space with associated valgus alignment. She was noted to have osteophyte as well as subchondral sclerosis. After discussion of the risks and benefits of surgical intervention, the patient expressed her understanding of the risks and benefits and agreed for plans for surgical intervention.   PROCEDURE: Right total knee arthroplasty using computer-assisted navigation.   ANESTHESIA: Spinal.   SOFT TISSUE RELEASE: Anterior cruciate ligament, posterior cruciate ligament, deep medial collateral ligaments, patellofemoral ligament, as well as the posterior lateral corner.   IMPLANTS UTILIZED: DePuy PFC Sigma size 5 posterior stabilized femoral component (cemented), size 4 MBT tibial component (cemented), 35 mm 3-peg oval dome patella  (cemented) and a 10 mm stabilized rotating platform polyethylene insert.   HOSPITAL COURSE: The patient tolerated the procedure very well. She had no complications. She was then taken to the PACU where she was stabilized and then transferred to the orthopedic floor. The patient began receiving anticoagulation therapy of Lovenox 30 mg subcutaneous every 12 hours per anesthesia and pharmacy protocol. She was fitted with TED stockings bilaterally. These were allowed to be removed 1 hour per 8 hour shift. The right one was applied on day 2 following removal of the Hemovac and dressing change. The patient was also fitted with AVI compression foot pumps bilaterally set at 80 mmHg. Her calves have been nontender. There has been no evidence of any DVTs. Negative Homans sign. Heels were elevated off the bed using rolled towels.   The patient has denied any chest pains or shortness of breath. However, the patient was noted to be extremely nauseated the first 36 hours and this cleared according to the patient just as quick as it cane. Vital signs have been stable. She has been afebrile. Hemodynamically, she was stable. No transfusions were given other than the Autovac transfusion given the first 6 hours.   Physical therapy was initiated on day 1 for gait training and transfers. Upon being discharged, she was ambulating greater than 200 feet. She was able go up and down 4 sets of steps. She was independent with bed to chair transfers. Occupational therapy was also initiated on day 1 for ADLs and assistive devices.   The patient's IV, Foley and Hemovac were discontinued on day 2 along with a dressing change. The wound was free of any drainage or signs of infection. Polar Care was reapplied to  the surgical leg, maintaining a temperature of 40 to 50 degrees Fahrenheit.   DISPOSITION: The patient is being discharged to home in improved stable condition.   DISCHARGE INSTRUCTIONS: She is to continue weight-bearing as  tolerated. Continue using a rolling walker until cleared by physical therapy to go to a quad cane. She has been instructed in elevation of the lower legs. She is to continue with TED stockings. These are allowed to be removed at night, but are to be worn during the day. Recommend that she use a Polar Care around-the-clock for the first 2 weeks maintaining a temperature of 40 to 50 degrees Fahrenheit.  She was instructed on wound care. She was given 3 extra  coverderms to take with her. She should only need to change it 2 or 3 times during the 2 week time frame. She has a followup appointment on March 24 at 9:45. She is to call the clinic sooner if any temperatures of 101.5 or greater or excessive bleeding.   DRUG ALLERGIES: AMOXICILLIN.   MEDICATIONS: She is to resume her regular medications that she was on prior to admission. She was given a prescription for oxycodone 5 to 10 mg every 4 to 6 hours p.r.n. for pain, Ultram 50 to 100 mg every 4 to 6 hours p.r.n. for pain and Lovenox 40 mg subcutaneously daily for 14 days, then discontinue and begin taking one 81 mg enteric-coated aspirin.   PAST MEDICAL HISTORY: Hyperlipidemia, hypertension and hypothyroidism.   ____________________________ Vance Peper, PA jrw:aw D: 02/23/2014 07:53:24 ET T: 02/23/2014 08:03:18 ET JOB#: [id number]  cc: Vance Peper, PA, <Dictator> Vallorie Niccoli PA ELECTRONICALLY SIGNED 03/02/2014 18:33

## 2015-04-07 NOTE — Op Note (Signed)
PATIENT NAME:  Wendy Barnes, PANAMENO MR#:  [id number] DATE OF BIRTH:  Dec 08, 1941  DATE OF PROCEDURE:  02/20/2014  PREOPERATIVE DIAGNOSIS: Degenerative arthrosis of the right knee.   POSTOPERATIVE DIAGNOSIS: Degenerative arthrosis of the right knee.   PROCEDURE PERFORMED: Right total knee arthroplasty using computer-assisted navigation.   SURGEON: Skip Estimable, MD  ASSISTANT: Vance Peper, PA (required to maintain retraction throughout the procedure).   ANESTHESIA: Spinal.   ESTIMATED BLOOD LOSS: 100 mL.   FLUIDS REPLACED: 1800 mL of crystalloid.   TOURNIQUET TIME: 95 minutes.   DRAINS: Two medium drains to reinfusion system.   SOFT TISSUE RELEASES: Anterior cruciate ligament, posterior cruciate ligament, deep medial collateral ligament, patellofemoral ligament, and posterolateral corner.   IMPLANTS UTILIZED: DePuy PFC Sigma size 5 posterior stabilized femoral component (cemented), size 4 MBT tibial component (cemented), 35 mm three-peg oval dome patella (cemented), and a 10 mm stabilized rotating platform polyethylene insert.   INDICATIONS FOR SURGERY: The patient is a 74 year old female who has been seen for complaints of progressive right knee pain. X-rays demonstrated severe degenerative changes in tricompartmental fashion with relative valgus deformity. After discussion of the risks and benefits of surgical intervention, the patient expressed understanding of the risks and benefits and agreed with plans for surgical intervention.   PROCEDURE IN DETAIL: The patient was brought into the operating room and, after adequate spinal anesthesia was achieved, a tourniquet was placed on the patient's upper right thigh. The patient's right knee and leg were cleaned and prepped with alcohol and DuraPrep and draped in the usual sterile fashion. A "timeout" was performed as per usual protocol. The right lower extremity was exsanguinated using an Esmarch, and the tourniquet was inflated to 300 mmHg. An  anterior longitudinal incision was made followed by a standard mid vastus approach. A large effusion was evacuated. The deep fibers of the medial collateral ligament were elevated in subperiosteal fashion off the medial flare of the tibia so as to maintain a continuous soft tissue sleeve. The patella was subluxed laterally and the patellofemoral ligament was incised. Inspection of the knee demonstrated severe degenerative changes in tricompartmental fashion with full-thickness loss of articular cartilage noted. Prominent osteophytes were debrided using a rongeur. Anterior and posterior cruciate ligaments were excised. Two 4.0 mm Schanz pins were inserted into the femur and into the tibia for attachment of the array of trackers used for computer-assisted navigation. Hip center was identified using a circumduction technique. Distal landmarks were mapped using the computer. The distal femur and proximal tibia were mapped using the computer. Distal femoral cutting guide was positioned using computer-assisted navigation so as to achieve a 5 degree distal valgus cut. Cut was performed and verified using the computer. Distal femur was sized and it was felt that a size 5 femoral component was appropriate. A size 5 cutting guide was positioned and anterior cut was performed and verified using the computer. This was followed by completion of the posterior and chamfer cuts. Femoral cutting guide for the central box was then positioned and the central box cut was performed. Attention was then directed to the proximal tibia. Medial and lateral menisci were excised. The extramedullary tibial cutting guide was positioned using computer-assisted navigation so as to achieve a 0 degree varus valgus alignment and 0 degree posterior slope. Cut was performed and verified using the computer. The proximal tibia was sized and it was felt that a size 4 tibial tray was appropriate. Tibial and femoral trials were inserted followed by  insertion of a 10  mm polyethylene insert. The knee was felt to be tight laterally. Trial components were removed and the knee was brought into extension and distracted using Moreland retractors. The posterolateral corner was carefully released using a combination of electrocautery and Metzenbaum scissors. This allowed for improved balance mediolaterally. Trial components were reinserted followed by insertion of a 10 mm polyethylene insert. Excellent mediolateral soft tissue balancing was noted both in full extension and in flexion. Finally, the patella was cut and prepared so as to accommodate a 35 mm three-peg oval dome patella. Patellar trial was placed and the knee was placed through a range of motion with excellent patellar tracking appreciated. The femoral trial was removed. Central post hole for the tibial component was reamed followed by insertion of a keel punch. Tibial trial was then removed. The cut surfaces of bone were irrigated with copious amounts of normal saline with antibiotic solution using pulsatile lavage and then suctioned dry. Polymethyl methacrylate cement was prepared in the usual fashion using a vacuum mixer. Cement was applied to the cut surface of the proximal tibia as well as along the undersurface of a size 4 MBT tibial component. The tibial component was positioned and impacted into place. Excess cement was removed using freer elevators. Cement was then applied to the cut surface of the femur as well as along the posterior flanges of a size 5 posterior stabilized femoral component. Femoral component was positioned and impacted into place. Excess cement was removed using freer elevators. A 10 mm polyethylene trial was inserted and the knee was brought into full extension with steady axial compression applied. Finally, cement was applied to the backside of a 35 mm three-peg oval dome patella and the patellar component was positioned and patellar clamp applied. Excess cement was removed  using freer elevators.   After adequate curing of the cement, tourniquet was deflated after a total tourniquet time of 95 minutes. Hemostasis was achieved using electrocautery. The knee was irrigated with copious amounts of normal saline with antibiotic solution using pulsatile lavage and then suctioned dry. The knee was inspected for any residual cement debris. 20 mL of 1.3% Exparel in 40 mL of normal saline was injected along the posterior capsule, medial and lateral gutters, and along the arthrotomy site. A 10 mm stabilized rotating platform polyethylene insert was inserted and the knee was placed through a range of motion with excellent patellar tracking appreciated and excellent mediolateral soft tissue balancing noted. Two medium drains were placed in the wound bed and brought out through a separate stab incision. The medial parapatellar portion of the incision was reapproximated using interrupted sutures of #1 Vicryl. The subcutaneous tissue was approximated in layers using first #0 Vicryl followed by #2-0 Vicryl. 30 mL of 0.25% Marcaine with epinephrine was injected into the subcutaneous tissue in line with the incision. Skin was then reapproximated using skin staples. A sterile dressing was applied.   The patient tolerated the procedure well. She was transported to the recovery room in stable condition.  ____________________________ Laurice Record. Holley Bouche., MD jph:sb D: 02/21/2014 06:32:46 ET T: 02/21/2014 08:03:51 ET JOB#: [id number]  cc: Laurice Record. Holley Bouche., MD, <Dictator> Laurice Record Holley Bouche MD ELECTRONICALLY SIGNED 02/21/2014 23:54

## 2015-04-16 DIAGNOSIS — Z0000 Encounter for general adult medical examination without abnormal findings: Secondary | ICD-10-CM | POA: Diagnosis not present

## 2015-04-16 DIAGNOSIS — G4700 Insomnia, unspecified: Secondary | ICD-10-CM | POA: Diagnosis not present

## 2015-04-16 DIAGNOSIS — I10 Essential (primary) hypertension: Secondary | ICD-10-CM | POA: Diagnosis not present

## 2015-04-17 DIAGNOSIS — E041 Nontoxic single thyroid nodule: Secondary | ICD-10-CM | POA: Diagnosis not present

## 2015-04-17 DIAGNOSIS — E780 Pure hypercholesterolemia: Secondary | ICD-10-CM | POA: Diagnosis not present

## 2015-04-17 DIAGNOSIS — I10 Essential (primary) hypertension: Secondary | ICD-10-CM | POA: Diagnosis not present

## 2015-04-17 DIAGNOSIS — R739 Hyperglycemia, unspecified: Secondary | ICD-10-CM | POA: Diagnosis not present

## 2015-04-17 LAB — BASIC METABOLIC PANEL
BUN: 17 mg/dL (ref 4–21)
Creatinine: 0.8 mg/dL (ref 0.5–1.1)
Glucose: 124 mg/dL
POTASSIUM: 4.6 mmol/L (ref 3.4–5.3)
SODIUM: 144 mmol/L (ref 137–147)

## 2015-04-17 LAB — CBC AND DIFFERENTIAL
HCT: 38 % (ref 36–46)
Hemoglobin: 13.1 g/dL (ref 12.0–16.0)
NEUTROS ABS: 4 /uL
Platelets: 237 10*3/uL (ref 150–399)
WBC: 7.6 10^3/mL

## 2015-04-17 LAB — HEPATIC FUNCTION PANEL
ALT: 22 U/L (ref 7–35)
AST: 18 U/L (ref 13–35)
Alkaline Phosphatase: 70 U/L (ref 25–125)
Bilirubin, Total: 0.3 mg/dL

## 2015-04-17 LAB — HEMOGLOBIN A1C: Hgb A1c MFr Bld: 6.3 % — AB (ref 4.0–6.0)

## 2015-04-17 LAB — LIPID PANEL
CHOLESTEROL: 222 mg/dL — AB (ref 0–200)
HDL: 51 mg/dL (ref 35–70)
LDL Cholesterol: 141 mg/dL
TRIGLYCERIDES: 148 mg/dL (ref 40–160)

## 2015-04-17 LAB — TSH: TSH: 1.79 u[IU]/mL (ref 0.41–5.90)

## 2015-04-24 DIAGNOSIS — Z0000 Encounter for general adult medical examination without abnormal findings: Secondary | ICD-10-CM | POA: Diagnosis not present

## 2015-04-24 DIAGNOSIS — G4700 Insomnia, unspecified: Secondary | ICD-10-CM | POA: Diagnosis not present

## 2015-04-24 DIAGNOSIS — K5792 Diverticulitis of intestine, part unspecified, without perforation or abscess without bleeding: Secondary | ICD-10-CM | POA: Diagnosis not present

## 2015-05-02 DIAGNOSIS — Z1231 Encounter for screening mammogram for malignant neoplasm of breast: Secondary | ICD-10-CM | POA: Diagnosis not present

## 2015-05-02 DIAGNOSIS — R922 Inconclusive mammogram: Secondary | ICD-10-CM | POA: Diagnosis not present

## 2015-05-08 DIAGNOSIS — R928 Other abnormal and inconclusive findings on diagnostic imaging of breast: Secondary | ICD-10-CM | POA: Diagnosis not present

## 2015-05-16 DIAGNOSIS — M722 Plantar fascial fibromatosis: Secondary | ICD-10-CM | POA: Insufficient documentation

## 2015-05-16 DIAGNOSIS — N898 Other specified noninflammatory disorders of vagina: Secondary | ICD-10-CM | POA: Insufficient documentation

## 2015-05-16 DIAGNOSIS — N3281 Overactive bladder: Secondary | ICD-10-CM | POA: Insufficient documentation

## 2015-05-16 DIAGNOSIS — E1142 Type 2 diabetes mellitus with diabetic polyneuropathy: Secondary | ICD-10-CM | POA: Insufficient documentation

## 2015-05-16 DIAGNOSIS — H6990 Unspecified Eustachian tube disorder, unspecified ear: Secondary | ICD-10-CM | POA: Insufficient documentation

## 2015-05-16 DIAGNOSIS — M25569 Pain in unspecified knee: Secondary | ICD-10-CM | POA: Insufficient documentation

## 2015-05-16 DIAGNOSIS — K635 Polyp of colon: Secondary | ICD-10-CM | POA: Insufficient documentation

## 2015-05-16 DIAGNOSIS — R739 Hyperglycemia, unspecified: Secondary | ICD-10-CM | POA: Insufficient documentation

## 2015-05-16 DIAGNOSIS — Z8719 Personal history of other diseases of the digestive system: Secondary | ICD-10-CM | POA: Insufficient documentation

## 2015-05-16 DIAGNOSIS — H6980 Other specified disorders of Eustachian tube, unspecified ear: Secondary | ICD-10-CM | POA: Insufficient documentation

## 2015-05-16 DIAGNOSIS — K5792 Diverticulitis of intestine, part unspecified, without perforation or abscess without bleeding: Secondary | ICD-10-CM | POA: Insufficient documentation

## 2015-05-16 DIAGNOSIS — E119 Type 2 diabetes mellitus without complications: Secondary | ICD-10-CM | POA: Insufficient documentation

## 2015-05-17 ENCOUNTER — Ambulatory Visit (INDEPENDENT_AMBULATORY_CARE_PROVIDER_SITE_OTHER): Payer: Medicare Other | Admitting: Physician Assistant

## 2015-05-17 ENCOUNTER — Encounter: Payer: Self-pay | Admitting: Physician Assistant

## 2015-05-17 ENCOUNTER — Other Ambulatory Visit: Payer: Self-pay

## 2015-05-17 VITALS — BP 126/68 | HR 74 | Temp 98.7°F | Resp 16 | Ht 66.0 in | Wt 212.4 lb

## 2015-05-17 DIAGNOSIS — H8111 Benign paroxysmal vertigo, right ear: Secondary | ICD-10-CM

## 2015-05-17 MED ORDER — MECLIZINE HCL 32 MG PO TABS: 32.0000 mg | ORAL_TABLET | Freq: Three times a day (TID) | ORAL | Status: DC | PRN

## 2015-05-17 NOTE — Progress Notes (Signed)
   Subjective:    Patient ID: Wendy Barnes, female    DOB: [birth year], 74 y.o.   MRN: [id number]  Dizziness This is a new problem. The current episode started in the past 7 days (Started Monday 05/14/15). The problem occurs 2 to 4 times per day. The problem has been gradually improving. Associated symptoms include anorexia, congestion, nausea and vertigo. Pertinent negatives include no abdominal pain, arthralgias, change in bowel habit, chest pain, chills, coughing, diaphoresis, fatigue, fever, headaches, joint swelling, myalgias, neck pain, numbness, rash, sore throat, swollen glands, urinary symptoms, visual change, vomiting or weakness. The symptoms are aggravated by bending (lying down with positional changes). She has tried nothing for the symptoms.      Review of Systems  Constitutional: Negative for fever, chills, diaphoresis, appetite change and fatigue.  HENT: Positive for congestion, rhinorrhea and sneezing. Negative for ear discharge, ear pain, hearing loss, sinus pressure, sore throat and trouble swallowing.   Eyes: Negative for photophobia, pain, discharge, redness and visual disturbance.  Respiratory: Negative for cough, choking and shortness of breath.   Cardiovascular: Negative for chest pain.  Gastrointestinal: Positive for nausea and anorexia. Negative for vomiting, abdominal pain and change in bowel habit.  Musculoskeletal: Negative for myalgias, joint swelling, arthralgias and neck pain.  Skin: Negative for rash.  Neurological: Positive for dizziness and vertigo. Negative for seizures, syncope, weakness, numbness and headaches.  All other systems reviewed and are negative.      Objective:   Physical Exam  Constitutional: She is oriented to person, place, and time. She appears well-developed and well-nourished.  HENT:  Head: Normocephalic and atraumatic.  Right Ear: Tympanic membrane, external ear and ear canal normal.  Left Ear: Tympanic membrane, external ear and ear  canal normal.  Nose: Nose normal. No rhinorrhea or septal deviation. Right sinus exhibits no maxillary sinus tenderness and no frontal sinus tenderness. Left sinus exhibits no maxillary sinus tenderness and no frontal sinus tenderness.  Mouth/Throat: Uvula is midline, oropharynx is clear and moist and mucous membranes are normal. She has dentures. No oropharyngeal exudate.  Eyes: Conjunctivae and lids are normal. Pupils are equal, round, and reactive to light. Right eye exhibits no discharge. Left eye exhibits no discharge. Right eye exhibits nystagmus (horizontal nystagmus noted with lateral movement of right eye).  Neck: Normal range of motion. Neck supple. No tracheal deviation present. No thyromegaly present.  Lymphadenopathy:    She has no cervical adenopathy.  Neurological: She is alert and oriented to person, place, and time. No cranial nerve deficit. Coordination normal.  Skin: Skin is warm and dry.  Psychiatric: She has a normal mood and affect. Her behavior is normal. Judgment and thought content normal.  Vitals reviewed.         Assessment & Plan:  1. BPPV (benign paroxysmal positional vertigo), right  Advised to stay well hydrated.  May take coricidin hbp for congestion as it may be related to vertigo with her history of ETD and recent flight as well.  Advised to not drive while vertigo is present.  Will treat with meclizine as below.  Instructed on how to perform epley maneuver and brandt-daroff maneuvers.  She is to call if symptoms persist or fail to improve.  - meclizine (ANTIVERT) 32 MG tablet; Take 1 tablet (32 mg total) by mouth 3 (three) times daily as needed for dizziness.  Dispense: 30 tablet; Refill: 0

## 2015-05-17 NOTE — Patient Instructions (Signed)
Epley Maneuver Self-Care WHAT IS THE EPLEY MANEUVER? The Epley maneuver is an exercise you can do to relieve symptoms of benign paroxysmal positional vertigo (BPPV). This condition is often just referred to as vertigo. BPPV is caused by the movement of tiny crystals (canaliths) inside your inner ear. The accumulation and movement of canaliths in your inner ear causes a sudden spinning sensation (vertigo) when you move your head to certain positions. Vertigo usually lasts about 30 seconds. BPPV usually occurs in just one ear. If you get vertigo when you lie on your left side, you probably have BPPV in your left ear. Your health care provider can tell you which ear is involved.  BPPV may be caused by a head injury. Many people older than 50 get BPPV for unknown reasons. If you have been diagnosed with BPPV, your health care provider may teach you how to do this maneuver. BPPV is not life threatening (benign) and usually goes away in time.  WHEN SHOULD I PERFORM THE EPLEY MANEUVER? You can do this maneuver at home whenever you have symptoms of vertigo. You may do the Epley maneuver up to 3 times a day until your symptoms of vertigo go away. HOW SHOULD I DO THE EPLEY MANEUVER? 1. Sit on the edge of a bed or table with your back straight. Your legs should be extended or hanging over the edge of the bed or table.  2. Turn your head halfway toward the affected ear.  3. Lie backward quickly with your head turned until you are lying flat on your back. You may want to position a pillow under your shoulders.  4. Hold this position for 30 seconds. You may experience an attack of vertigo. This is normal. Hold this position until the vertigo stops. 5. Then turn your head to the opposite direction until your unaffected ear is facing the floor.  6. Hold this position for 30 seconds. You may experience an attack of vertigo. This is normal. Hold this position until the vertigo stops. 7. Now turn your whole body to  the same side as your head. Hold for another 30 seconds.  8. You can then sit back up. ARE THERE RISKS TO THIS MANEUVER? In some cases, you may have other symptoms (such as changes in your vision, weakness, or numbness). If you have these symptoms, stop doing the maneuver and call your health care provider. Even if doing these maneuvers relieves your vertigo, you may still have dizziness. Dizziness is the sensation of light-headedness but without the sensation of movement. Even though the Epley maneuver may relieve your vertigo, it is possible that your symptoms will return within 5 years. WHAT SHOULD I DO AFTER THIS MANEUVER? After doing the Epley maneuver, you can return to your normal activities. Ask your doctor if there is anything you should do at home to prevent vertigo. This may include:  Sleeping with two or more pillows to keep your head elevated.  Not sleeping on the side of your affected ear.  Getting up slowly from bed.  Avoiding sudden movements during the day.  Avoiding extreme head movement, like looking up or bending over.  Wearing a cervical collar to prevent sudden head movements. WHAT SHOULD I DO IF MY SYMPTOMS GET WORSE? Call your health care provider if your vertigo gets worse. Call your provider right way if you have other symptoms, including:   Nausea.  Vomiting.  Headache.  Weakness.  Numbness.  Vision changes. Document Released: 12/06/2013 Document Reviewed: 12/06/2013 ExitCare   Patient Information 2015 [place]. This information is not intended to replace advice given to you by your health care provider. Make sure you discuss any questions you have with your health care provider. Benign Positional Vertigo Vertigo means you feel like you or your surroundings are moving when they are not. Benign positional vertigo is the most common form of vertigo. Benign means that the cause of your condition is not serious. Benign positional vertigo is more common  in older adults. CAUSES  Benign positional vertigo is the result of an upset in the labyrinth system. This is an area in the middle ear that helps control your balance. This may be caused by a viral infection, head injury, or repetitive motion. However, often no specific cause is found. SYMPTOMS  Symptoms of benign positional vertigo occur when you move your head or eyes in different directions. Some of the symptoms may include: 9. Loss of balance and falls. 10. Vomiting. 11. Blurred vision. 12. Dizziness. 13. Nausea. 14. Involuntary eye movements (nystagmus). DIAGNOSIS  Benign positional vertigo is usually diagnosed by physical exam. If the specific cause of your benign positional vertigo is unknown, your caregiver may perform imaging tests, such as magnetic resonance imaging (MRI) or computed tomography (CT). TREATMENT  Your caregiver may recommend movements or procedures to correct the benign positional vertigo. Medicines such as meclizine, benzodiazepines, and medicines for nausea may be used to treat your symptoms. In rare cases, if your symptoms are caused by certain conditions that affect the inner ear, you may need surgery. HOME CARE INSTRUCTIONS   Follow your caregiver's instructions.  Move slowly. Do not make sudden body or head movements.  Avoid driving.  Avoid operating heavy machinery.  Avoid performing any tasks that would be dangerous to you or others during a vertigo episode.  Drink enough fluids to keep your urine clear or pale yellow. SEEK IMMEDIATE MEDICAL CARE IF:   You develop problems with walking, weakness, numbness, or using your arms, hands, or legs.  You have difficulty speaking.  You develop severe headaches.  Your nausea or vomiting continues or gets worse.  You develop visual changes.  Your family or friends notice any behavioral changes.  Your condition gets worse.  You have a fever.  You develop a stiff neck or sensitivity to light. MAKE  SURE YOU:   Understand these instructions.  Will watch your condition.  Will get help right away if you are not doing well or get worse. Document Released: 09/08/2006 Document Revised: 02/23/2012 Document Reviewed: 08/21/2011 [facility] Patient Information 2015 [place], [place]. This information is not intended to replace advice given to you by your health care provider. Make sure you discuss any questions you have with your health care provider.

## 2015-05-24 DIAGNOSIS — L821 Other seborrheic keratosis: Secondary | ICD-10-CM | POA: Diagnosis not present

## 2015-05-24 DIAGNOSIS — L578 Other skin changes due to chronic exposure to nonionizing radiation: Secondary | ICD-10-CM | POA: Diagnosis not present

## 2015-05-24 DIAGNOSIS — D229 Melanocytic nevi, unspecified: Secondary | ICD-10-CM | POA: Diagnosis not present

## 2015-05-24 DIAGNOSIS — L820 Inflamed seborrheic keratosis: Secondary | ICD-10-CM | POA: Diagnosis not present

## 2015-07-16 ENCOUNTER — Encounter: Payer: Self-pay | Admitting: Family Medicine

## 2015-07-16 ENCOUNTER — Ambulatory Visit (INDEPENDENT_AMBULATORY_CARE_PROVIDER_SITE_OTHER): Payer: Medicare Other | Admitting: Family Medicine

## 2015-07-16 VITALS — BP 116/64 | HR 68 | Temp 98.4°F | Resp 16 | Ht 65.5 in | Wt 215.0 lb

## 2015-07-16 DIAGNOSIS — E7800 Pure hypercholesterolemia, unspecified: Secondary | ICD-10-CM

## 2015-07-16 DIAGNOSIS — R739 Hyperglycemia, unspecified: Secondary | ICD-10-CM | POA: Diagnosis not present

## 2015-07-16 DIAGNOSIS — I10 Essential (primary) hypertension: Secondary | ICD-10-CM | POA: Diagnosis not present

## 2015-07-16 DIAGNOSIS — E041 Nontoxic single thyroid nodule: Secondary | ICD-10-CM

## 2015-07-16 LAB — POCT GLYCOSYLATED HEMOGLOBIN (HGB A1C)
ESTIMATED AVERAGE GLUCOSE: 126
Hemoglobin A1C: 6

## 2015-07-16 MED ORDER — LOVASTATIN 10 MG PO TABS: 10.0000 mg | ORAL_TABLET | Freq: Every day | ORAL | Status: DC

## 2015-07-16 MED ORDER — OLMESARTAN MEDOXOMIL-HCTZ 20-12.5 MG PO TABS: 1.0000 | ORAL_TABLET | Freq: Every day | ORAL | Status: DC

## 2015-07-16 NOTE — Progress Notes (Signed)
Subjective:    Patient ID: Wendy Barnes, female    DOB: 1941-03-09, 74 y.o.   MRN: [id number]  Hyperglycemia Progression since onset: A1C on 04/17/2015 was 6.3% Associated symptoms include arthralgias, fatigue (due to life circumstances), myalgias (right lower back) and numbness (feet). Pertinent negatives include no abdominal pain, anorexia, change in bowel habit, chest pain, chills, congestion, coughing, diaphoresis, fever, headaches, joint swelling, nausea, neck pain, vertigo, visual change or weakness.  Hyperlipidemia Lipid results: 04/17/2015 Total-222; trig-148; HDL-51; LDL-141. Associated symptoms include myalgias (right lower back). Pertinent negatives include no chest pain, focal sensory loss, focal weakness, leg pain or shortness of breath. Current antihyperlipidemic treatment includes statins (Lovastatin 10 mg, pt could not tolerate higher dose; therfore pt was to work on lifestyle changes).  Hypertension Associated symptoms include anxiety and malaise/fatigue. Pertinent negatives include no blurred vision, chest pain, headaches, neck pain, orthopnea, palpitations, peripheral edema, shortness of breath or sweats. Treatments tried: Benicar.  Hypothyroidism Pt is currently taking Synthroid 100 MCG. Last TSH was checked on 04/17/2015, and was 1.790.  Daughter had a stroke.  Going thru rehab and is now at home. Tries to help but having a lot of anxiety.    Review of Systems  Constitutional: Positive for malaise/fatigue and fatigue (due to life circumstances). Negative for fever, chills and diaphoresis.  HENT: Negative for congestion.   Eyes: Negative for blurred vision.  Respiratory: Negative for cough and shortness of breath.   Cardiovascular: Negative for chest pain, palpitations and orthopnea.  Gastrointestinal: Negative for nausea, abdominal pain, anorexia and change in bowel habit.  Musculoskeletal: Positive for myalgias (right lower back) and arthralgias. Negative for joint  swelling and neck pain.  Neurological: Positive for numbness (feet). Negative for vertigo, focal weakness, weakness and headaches.   BP 116/64 mmHg  Pulse 68  Temp(Src) 98.4 F (36.9 C) (Oral)  Resp 16  Ht 5' 5.5" (1.664 m)  Wt 215 lb (97.523 kg)  BMI 35.22 kg/m2   Patient Active Problem List   Diagnosis Date Noted  . Colon polyp 05/16/2015  . Diverticulitis 05/16/2015  . Dysfunction of eustachian tube 05/16/2015  . Blood glucose elevated 05/16/2015  . Gonalgia 05/16/2015  . Plantar fasciitis 05/16/2015  . 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 Vaginal lesion 05/16/2015  . Burning or prickling sensation 04/10/2010  . Thyroid nodule 12/28/2008  . Cannot sleep 12/10/2007  . Dermatologic disease 09/15/2007  . BP (high blood pressure) 08/28/2006  . Hypercholesteremia 08/28/2006  . Mixed incontinence 08/28/2006   No past medical history on file. Current Outpatient Prescriptions on File Prior to Visit  Medication Sig  . ALPRAZolam (XANAX) 0.5 MG tablet Take 0.5-1 tablets by mouth as needed.  . Calcium Carbonate-Vitamin D 600-200 MG-UNIT CAPS 1 tablet daily.  [name] levothyroxine (SYNTHROID, LEVOTHROID) 100 MCG tablet Take 1 tablet by mouth daily.  [name] lovastatin (MEVACOR) 10 MG tablet Take 1 tablet by mouth daily.  . MULTIPLE VITAMIN PO 1 tablet daily.  [name] olmesartan-hydrochlorothiazide (BENICAR HCT) 20-12.5 MG per tablet Take 1 tablet by mouth daily.  [name] oxybutynin (DITROPAN) 5 MG tablet Take 1 tablet by mouth daily.   No current facility-administered medications on file prior to visit.   Allergies  Allergen Reactions  . Amoxicillin Itching  . Penicillins    Past Surgical History  Procedure Laterality Date  . Tonsillectomy and adenoidectomy  1950  . Dilation and curettage of uterus  2002  . 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 Thyroidectomy  2010   History  Social History  . Marital Status: Widowed    Spouse Name: N/A  . Number of Children: N/A  . Years of  Education: N/A   Occupational History  . Not on file.   Social History Main Topics  . Smoking status: Former Smoker -- 1.00 packs/day for 25 years    Types: Cigarettes    Quit date: 12/15/1993  . Smokeless tobacco: Never Used     Comment: quit in 1990's  . Alcohol Use: 0.0 oz/week    0 Standard drinks or equivalent per week     Comment: occasionally  . Drug Use: No  . Sexual Activity: Not on file   Other Topics Concern  . Not on file   Social History Narrative   No family history on file.     Objective:   Physical Exam  Constitutional: She is oriented to person, place, and time. She appears well-developed and well-nourished.  Neurological: She is alert and oriented to person, place, and time.  Psychiatric: She has a normal mood and affect.   BP 116/64 mmHg  Pulse 68  Temp(Src) 98.4 F (36.9 C) (Oral)  Resp 16  Ht 5' 5.5" (1.664 m)  Wt 215 lb (97.523 kg)  BMI 35.22 kg/m2      Assessment & Plan:  1. Essential hypertension Stable. Will try generic. Monitor and will send in mail order if stable.  - olmesartan-hydrochlorothiazide (BENICAR HCT) 20-12.5 MG per tablet; Take 1 tablet by mouth daily.  Dispense: 30 tablet; Refill: 0  2. Blood glucose elevated Improved. Continue trying to take care of self and recheck in 3 months.  - POCT glycosylated hemoglobin (Hb A1C) Results for orders placed or performed in visit on 07/16/15  POCT glycosylated hemoglobin (Hb A1C)  Result Value Ref Range   Hemoglobin A1C 6.0    Est. average glucose Bld gHb Est-mCnc 126     3. Hypercholesteremia Condition is stable. Please continue current medication and  plan of care as noted.   - lovastatin (MEVACOR) 10 MG tablet; Take 1 tablet (10 mg total) by mouth daily.  Dispense: 90 tablet; Refill: 3  4. Thyroid nodule Stable. Labs stable. Continue medication.   Margarita Rana, MD

## 2015-07-19 ENCOUNTER — Telehealth: Payer: Self-pay | Admitting: Family Medicine

## 2015-07-19 MED ORDER — LOSARTAN POTASSIUM-HCTZ 50-12.5 MG PO TABS: 1.0000 | ORAL_TABLET | Freq: Every day | ORAL | Status: DC

## 2015-07-19 NOTE — Telephone Encounter (Signed)
Pt called needing to talk to you about her blood pressure medication.  She does not want the benecar.?  Thanks, [employer]

## 2015-07-19 NOTE — Telephone Encounter (Signed)
Talked to pt as directed below, she stated that the price of Benicar has gotten up so high, and wants to know if there any other medication similar to it.  Thanks,

## 2015-07-26 DIAGNOSIS — L820 Inflamed seborrheic keratosis: Secondary | ICD-10-CM | POA: Diagnosis not present

## 2015-07-26 DIAGNOSIS — D239 Other benign neoplasm of skin, unspecified: Secondary | ICD-10-CM | POA: Diagnosis not present

## 2015-07-26 DIAGNOSIS — L821 Other seborrheic keratosis: Secondary | ICD-10-CM | POA: Diagnosis not present

## 2015-08-22 ENCOUNTER — Other Ambulatory Visit: Payer: Self-pay | Admitting: Family Medicine

## 2015-08-22 DIAGNOSIS — I10 Essential (primary) hypertension: Secondary | ICD-10-CM

## 2015-08-22 MED ORDER — LOSARTAN POTASSIUM-HCTZ 50-12.5 MG PO TABS: 1.0000 | ORAL_TABLET | Freq: Every day | ORAL | Status: DC

## 2015-08-22 NOTE — Telephone Encounter (Signed)
Pt states she needs a refill on the following medication @ [facility]    losartan-hydrochlorothiazide (HYZAAR) 50-12.5 MG per tablet

## 2015-10-04 DIAGNOSIS — H2513 Age-related nuclear cataract, bilateral: Secondary | ICD-10-CM | POA: Diagnosis not present

## 2015-10-16 ENCOUNTER — Ambulatory Visit (INDEPENDENT_AMBULATORY_CARE_PROVIDER_SITE_OTHER): Payer: Medicare Other | Admitting: Family Medicine

## 2015-10-16 ENCOUNTER — Encounter: Payer: Self-pay | Admitting: Family Medicine

## 2015-10-16 VITALS — BP 120/76 | HR 74 | Temp 98.1°F | Resp 16 | Ht 66.0 in | Wt 218.0 lb

## 2015-10-16 DIAGNOSIS — N3946 Mixed incontinence: Secondary | ICD-10-CM

## 2015-10-16 DIAGNOSIS — G4700 Insomnia, unspecified: Secondary | ICD-10-CM | POA: Diagnosis not present

## 2015-10-16 DIAGNOSIS — I10 Essential (primary) hypertension: Secondary | ICD-10-CM

## 2015-10-16 DIAGNOSIS — Z23 Encounter for immunization: Secondary | ICD-10-CM

## 2015-10-16 DIAGNOSIS — R739 Hyperglycemia, unspecified: Secondary | ICD-10-CM

## 2015-10-16 LAB — POCT GLYCOSYLATED HEMOGLOBIN (HGB A1C): Hemoglobin A1C: 6.2

## 2015-10-16 MED ORDER — OXYBUTYNIN CHLORIDE 5 MG PO TABS: 5.0000 mg | ORAL_TABLET | Freq: Every day | ORAL | Status: DC

## 2015-10-16 MED ORDER — ALPRAZOLAM 0.5 MG PO TABS: 0.2500 mg | ORAL_TABLET | Freq: Every evening | ORAL | Status: DC | PRN

## 2015-10-16 NOTE — Progress Notes (Signed)
Patient ID: Wendy Barnes, female   DOB: 1941-04-11, 74 y.o.   MRN: [id number]        Patient: Wendy Barnes Female    DOB: 24-Oct-1941   75 y.o.   MRN: [id number] Visit Date: 10/16/2015  Today's Provider: Margarita Rana, MD   Chief Complaint  Patient presents with  . Diabetes  . Hypertension  . Depression   Subjective:    HPI  Prediabetes, Follow-up:   Lab Results  Component Value Date   HGBA1C 6.2 10/16/2015   HGBA1C 6.0 07/16/2015   HGBA1C 6.3* 04/17/2015   GLUCOSE 103* 02/22/2014   GLUCOSE 107* 02/21/2014   GLUCOSE 97 02/08/2014    Last seen for for this3 months ago.  Management since that visit includes A1c. Current symptoms include none and have been stable.  Weight trend: stable Prior visit with dietician: no Current diet: in general, a "healthy" diet   Current exercise: none  Pertinent Labs:    Component Value Date/Time   CHOL 222* 04/17/2015   TRIG 148 04/17/2015   CREATININE 0.8 04/17/2015   CREATININE 0.58* 02/22/2014 0632    Wt Readings from Last 3 Encounters:  10/16/15 218 lb (98.884 kg)  07/16/15 215 lb (97.523 kg)  05/17/15 212 lb 6.4 oz (96.344 kg)        Hypertension, follow-up:  BP Readings from Last 3 Encounters:  10/16/15 120/76  07/16/15 116/64  05/17/15 126/68    She was last seen for hypertension 3 months ago.  BP at that visit was 116/64. Management changes since that visit include starting generic Benicar HCT. She reports excellent compliance with treatment. She is not having side effects. . She is adherent to low salt diet.   Outside blood pressures are not being checked. She is experiencing none.  Patient denies chest pain.   Cardiovascular risk factors include obesity (BMI >= 30 kg/m2).  Use of agents associated with hypertension: none.   ------------------------------------------------------------------------   Depression, Follow-up  She  was last seen for this several months ago. Changes made at last visit  include none.   She reports excellent compliance with treatment. She is not having side effects.   She reports good tolerance of treatment. Current symptoms include: depressed mood, difficulty concentrating and insomnia She feels she is Worse since last visit. Patient reports that her daughter passed away on 2015/10/27, daughter was 38 years old. She was married, no children. Her other children are in town right now.   Remer Macho is 04/04/23.   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    Take alprazolam for sleep.     Also would like  Oxybutynin for 3 months.       Allergies  Allergen Reactions  . Amoxicillin Itching  . Penicillins    Previous Medications   ALPRAZOLAM (XANAX) 0.5 MG TABLET    Take 0.5-1 tablets by mouth as needed.   CALCIUM CARBONATE-VITAMIN D 600-200 MG-UNIT CAPS    1 tablet daily.   LEVOTHYROXINE (SYNTHROID, LEVOTHROID) 100 MCG TABLET    Take 1 tablet by mouth daily.   LOSARTAN-HYDROCHLOROTHIAZIDE (HYZAAR) 50-12.5 MG PER TABLET    Take 1 tablet by mouth daily.   LOVASTATIN (MEVACOR) 10 MG TABLET    Take 1 tablet (10 mg total) by mouth daily.   MULTIPLE VITAMIN PO    1 tablet daily.   OMEGA-3 FATTY ACIDS (FISH OIL) 1200 MG CAPS    Take by mouth.   OXYBUTYNIN (DITROPAN) 5 MG TABLET    Take 1  tablet by mouth daily.    Review of Systems  Constitutional: Negative.   Eyes: Negative.   Cardiovascular: Negative.   Gastrointestinal: Negative.   Endocrine: Negative.   Psychiatric/Behavioral: Positive for sleep disturbance.    Social History  Substance Use Topics  . Smoking status: Former Smoker -- 1.00 packs/day for 25 years    Types: Cigarettes    Quit date: 12/15/1993  . Smokeless tobacco: Never Used     Comment: quit in 1990's  . Alcohol Use: 0.0 oz/week    0 Standard drinks or equivalent per week     Comment: occasionally   Objective:   BP 120/76 mmHg  Pulse 74  Temp(Src) 98.1 F (36.7 C) (Oral)  Resp 16  Ht 5\' 6"  (1.676 m)  Wt 218 lb  (98.884 kg)  BMI 35.20 kg/m2  SpO2 98%  Physical Exam  Constitutional: She is oriented to person, place, and time. She appears well-developed and well-nourished.  Cardiovascular: Normal rate and regular rhythm.   Pulmonary/Chest: Effort normal and breath sounds normal.  Neurological: She is alert and oriented to person, place, and time.  Psychiatric: She has a normal mood and affect. Her behavior is normal. Judgment and thought content normal.      Assessment & Plan:      1. Blood glucose elevated Stable. Continue current medication and plan of care.  - POCT glycosylated hemoglobin (Hb A1C) Results for orders placed or performed in visit on 10/16/15  POCT glycosylated hemoglobin (Hb A1C)  Result Value Ref Range   Hemoglobin A1C 6.2     2. Essential hypertension Stable on new medication.   3. Cannot sleep Will refill medication and monitor.  Patient just lost her daughter unexpectedly after follow up surgery. Spent most of visit in grief counseling.   Will call if worsens. Take Xanax as needed. Afraid she is still in shock at this point.  - ALPRAZolam (XANAX) 0.5 MG tablet; Take 0.5-1 tablets (0.25-0.5 mg total) by mouth at bedtime as needed.  Dispense: 30 tablet; Refill: 5  4. Mixed incontinence Stable.  Will refill medication for 90 days.   - oxybutynin (DITROPAN) 5 MG tablet; Take 1 tablet (5 mg total) by mouth daily.  Dispense: 90 tablet; Refill: [place], MD  [facility]

## 2016-01-01 ENCOUNTER — Other Ambulatory Visit: Payer: Self-pay | Admitting: Family Medicine

## 2016-01-01 DIAGNOSIS — E7800 Pure hypercholesterolemia, unspecified: Secondary | ICD-10-CM

## 2016-01-01 DIAGNOSIS — E041 Nontoxic single thyroid nodule: Secondary | ICD-10-CM

## 2016-01-01 MED ORDER — LOVASTATIN 10 MG PO TABS: 10.0000 mg | ORAL_TABLET | Freq: Every day | ORAL | Status: DC

## 2016-01-01 MED ORDER — LEVOTHYROXINE SODIUM 100 MCG PO TABS: 100.0000 ug | ORAL_TABLET | Freq: Every day | ORAL | Status: DC

## 2016-01-01 NOTE — Telephone Encounter (Signed)
Pt contacted office for refill request on the following medications:  Wendy Barnes.  CB#[phone]/MW  levothyroxine (SYNTHROID, LEVOTHROID) 100 MCG tablet  lovastatin (MEVACOR) 10 MG tablet

## 2016-01-16 ENCOUNTER — Encounter: Payer: Self-pay | Admitting: Family Medicine

## 2016-01-16 ENCOUNTER — Ambulatory Visit (INDEPENDENT_AMBULATORY_CARE_PROVIDER_SITE_OTHER): Payer: Medicare Other | Admitting: Family Medicine

## 2016-01-16 VITALS — BP 124/62 | HR 76 | Temp 98.0°F | Resp 16 | Wt 221.0 lb

## 2016-01-16 DIAGNOSIS — E7800 Pure hypercholesterolemia, unspecified: Secondary | ICD-10-CM | POA: Diagnosis not present

## 2016-01-16 DIAGNOSIS — I10 Essential (primary) hypertension: Secondary | ICD-10-CM | POA: Diagnosis not present

## 2016-01-16 DIAGNOSIS — R739 Hyperglycemia, unspecified: Secondary | ICD-10-CM | POA: Diagnosis not present

## 2016-01-16 LAB — POCT GLYCOSYLATED HEMOGLOBIN (HGB A1C)
ESTIMATED AVERAGE GLUCOSE: 137
HEMOGLOBIN A1C: 6.4

## 2016-01-16 NOTE — Progress Notes (Signed)
Subjective:    Patient ID: Wendy Barnes, female    DOB: 17-Nov-1941, 75 y.o.   MRN: [id number]  Hyperglycemia This is a chronic problem. Episode onset: FU from 10/16/2015. A1C was 6.2% Associated symptoms include myalgias and numbness (fingers; improved after going to chiropractor). Pertinent negatives include no abdominal pain, anorexia, arthralgias, change in bowel habit, chest pain, fatigue, headaches, nausea, neck pain, urinary symptoms, vertigo, visual change or weakness. She has tried nothing for the symptoms.  Hypertension This is a chronic problem. The problem is controlled. Pertinent negatives include no anxiety, blurred vision, chest pain, headaches, malaise/fatigue, neck pain, orthopnea, palpitations, peripheral edema, shortness of breath or sweats. Risk factors for coronary artery disease include dyslipidemia, obesity, post-menopausal state and family history. Treatments tried: Losaratan-HCTZ 50-12.5 mg. The current treatment provides moderate improvement. There are no compliance problems.   Joint/Muscle Pain: Patient complains of myalgias for which has been present for 1 month. Pain is located in both legs, is described as aching, and is intermittent .  Associated symptoms include: none.  The patient has tried nothing for pain relief.  Related to injury:   No. Pt believes this may be secondary Lovastatin 10 mg. Pt had the same problem with 20 mg Lovastatin in the past.   Review of Systems  Constitutional: Negative for malaise/fatigue and fatigue.  Eyes: Negative for blurred vision.  Respiratory: Negative for shortness of breath.   Cardiovascular: Negative for chest pain, palpitations and orthopnea.  Gastrointestinal: Negative for nausea, abdominal pain, anorexia and change in bowel habit.  Musculoskeletal: Positive for myalgias. Negative for arthralgias and neck pain.  Neurological: Positive for numbness (fingers; improved after going to chiropractor). Negative for vertigo, weakness  and headaches.   BP 124/62 mmHg  Pulse 76  Temp(Src) 98 F (36.7 C) (Oral)  Resp 16  Wt 221 lb (100.245 kg)   Patient Active Problem List   Diagnosis Date Noted  . Colon polyp 05/16/2015  . Diverticulitis 05/16/2015  . Dysfunction of eustachian tube 05/16/2015  . Blood glucose elevated 05/16/2015  . Gonalgia 05/16/2015  . Plantar fasciitis 05/16/2015  . 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 Vaginal lesion 05/16/2015  . Burning or prickling sensation 04/10/2010  . Thyroid nodule 12/28/2008  . Cannot sleep 12/10/2007  . Dermatologic disease 09/15/2007  . BP (high blood pressure) 08/28/2006  . Hypercholesteremia 08/28/2006  . Mixed incontinence 08/28/2006   Past Medical History  Diagnosis Date  . Diabetes mellitus without complication ([place])   . Hypertension   . Thyroid disease    Current Outpatient Prescriptions on File Prior to Visit  Medication Sig  . ALPRAZolam (XANAX) 0.5 MG tablet Take 0.5-1 tablets (0.25-0.5 mg total) by mouth at bedtime as needed.  . Calcium Carbonate-Vitamin D 600-200 MG-UNIT CAPS 1 tablet daily.  [name] levothyroxine (SYNTHROID, LEVOTHROID) 100 MCG tablet Take 1 tablet (100 mcg total) by mouth daily.  [name] losartan-hydrochlorothiazide (HYZAAR) 50-12.5 MG per tablet Take 1 tablet by mouth daily.  [name] lovastatin (MEVACOR) 10 MG tablet Take 1 tablet (10 mg total) by mouth daily.  . MULTIPLE VITAMIN PO 1 tablet daily.  . Omega-3 Fatty Acids (FISH OIL) 1200 MG CAPS Take by mouth.  . oxybutynin (DITROPAN) 5 MG tablet Take 1 tablet (5 mg total) by mouth daily.   No current facility-administered medications on file prior to visit.   Allergies  Allergen Reactions  . Amoxicillin Itching  . Penicillins    Past Surgical History  Procedure Laterality Date  . Tonsillectomy and adenoidectomy  1950  . Dilation and curettage of uterus  2002  . 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 Thyroidectomy  2010   Social History   Social History  . Marital Status:  Widowed    Spouse Name: N/A  . Number of Children: N/A  . Years of Education: N/A   Occupational History  . Not on file.   Social History Main Topics  . Smoking status: Former Smoker -- 1.00 packs/day for 25 years    Types: Cigarettes    Quit date: 12/15/1995  . Smokeless tobacco: Never Used     Comment: quit in 1990's  . Alcohol Use: 0.0 oz/week    0 Standard drinks or equivalent per week     Comment: occasionally  . Drug Use: No  . Sexual Activity: Not on file   Other Topics Concern  . Not on file   Social History Narrative   Family History  Problem Relation Age of Onset  . Stroke Father       Objective:   Physical Exam  Constitutional: She is oriented to person, place, and time. She appears well-developed and well-nourished.  Neurological: She is alert and oriented to person, place, and time.  Psychiatric: She has a normal mood and affect. Her behavior is normal. Judgment and thought content normal.   BP 124/62 mmHg  Pulse 76  Temp(Src) 98 F (36.7 C) (Oral)  Resp 16  Wt 221 lb (100.245 kg)     Assessment & Plan:  1. Blood glucose elevated Elevated today, but  Not much. Still dealing with the death of her daughter. 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 Did well with initial surgery.  Did go to a grief counseling last night.    - POCT glycosylated hemoglobin (Hb A1C) Results for orders placed or performed in visit on 01/16/16  POCT glycosylated hemoglobin (Hb A1C)  Result Value Ref Range   Hemoglobin A1C 6.4    Est. average glucose Bld gHb Est-mCnc 137     2. Essential hypertension Condition is stable. Please continue current medication and  plan of care as noted.    3. Hypercholesteremia Is having muscle cramps on Lovastatin. Will call to to see if cramps resolve. Follow up for Wellness in 3 months.   Patient was seen and examined by Jerrell Belfast, MD, and note scribed by Renaldo Fiddler, CMA. I have reviewed the document for accuracy and  completeness and I agree with above. Jerrell Belfast, MD   Margarita Rana, MD   Margarita Rana, MD

## 2016-02-19 ENCOUNTER — Other Ambulatory Visit: Payer: Self-pay | Admitting: Orthopedic Surgery

## 2016-02-19 DIAGNOSIS — M5440 Lumbago with sciatica, unspecified side: Secondary | ICD-10-CM

## 2016-02-19 DIAGNOSIS — G8929 Other chronic pain: Secondary | ICD-10-CM | POA: Diagnosis not present

## 2016-02-19 DIAGNOSIS — M5442 Lumbago with sciatica, left side: Secondary | ICD-10-CM | POA: Diagnosis not present

## 2016-02-19 DIAGNOSIS — Z96651 Presence of right artificial knee joint: Secondary | ICD-10-CM | POA: Diagnosis not present

## 2016-02-19 DIAGNOSIS — M545 Low back pain: Secondary | ICD-10-CM | POA: Diagnosis not present

## 2016-02-24 DIAGNOSIS — Z96651 Presence of right artificial knee joint: Secondary | ICD-10-CM | POA: Insufficient documentation

## 2016-03-04 DIAGNOSIS — Z86010 Personal history of colonic polyps: Secondary | ICD-10-CM | POA: Diagnosis not present

## 2016-03-11 ENCOUNTER — Ambulatory Visit
Admission: RE | Admit: 2016-03-11 | Discharge: 2016-03-11 | Disposition: A | Discharge status: Home / Self Care | Payer: Medicare Other | Source: Ambulatory Visit | Attending: Orthopedic Surgery | Admitting: Orthopedic Surgery

## 2016-03-11 DIAGNOSIS — M545 Low back pain: Secondary | ICD-10-CM | POA: Diagnosis not present

## 2016-03-11 DIAGNOSIS — K8020 Calculus of gallbladder without cholecystitis without obstruction: Secondary | ICD-10-CM | POA: Insufficient documentation

## 2016-03-11 DIAGNOSIS — M5440 Lumbago with sciatica, unspecified side: Secondary | ICD-10-CM

## 2016-03-11 DIAGNOSIS — M47816 Spondylosis without myelopathy or radiculopathy, lumbar region: Secondary | ICD-10-CM | POA: Insufficient documentation

## 2016-03-11 DIAGNOSIS — M5126 Other intervertebral disc displacement, lumbar region: Secondary | ICD-10-CM | POA: Diagnosis not present

## 2016-03-13 DIAGNOSIS — M4726 Other spondylosis with radiculopathy, lumbar region: Secondary | ICD-10-CM | POA: Diagnosis not present

## 2016-03-29 ENCOUNTER — Other Ambulatory Visit: Payer: Self-pay | Admitting: Family Medicine

## 2016-03-29 DIAGNOSIS — E039 Hypothyroidism, unspecified: Secondary | ICD-10-CM

## 2016-04-02 DIAGNOSIS — L578 Other skin changes due to chronic exposure to nonionizing radiation: Secondary | ICD-10-CM | POA: Diagnosis not present

## 2016-04-02 DIAGNOSIS — D229 Melanocytic nevi, unspecified: Secondary | ICD-10-CM | POA: Diagnosis not present

## 2016-04-02 DIAGNOSIS — D485 Neoplasm of uncertain behavior of skin: Secondary | ICD-10-CM | POA: Diagnosis not present

## 2016-04-02 DIAGNOSIS — L281 Prurigo nodularis: Secondary | ICD-10-CM | POA: Diagnosis not present

## 2016-04-02 DIAGNOSIS — L812 Freckles: Secondary | ICD-10-CM | POA: Diagnosis not present

## 2016-04-02 DIAGNOSIS — Z85828 Personal history of other malignant neoplasm of skin: Secondary | ICD-10-CM | POA: Diagnosis not present

## 2016-04-02 DIAGNOSIS — L821 Other seborrheic keratosis: Secondary | ICD-10-CM | POA: Diagnosis not present

## 2016-04-02 DIAGNOSIS — L820 Inflamed seborrheic keratosis: Secondary | ICD-10-CM | POA: Diagnosis not present

## 2016-04-02 DIAGNOSIS — Z1283 Encounter for screening for malignant neoplasm of skin: Secondary | ICD-10-CM | POA: Diagnosis not present

## 2016-04-02 DIAGNOSIS — L739 Follicular disorder, unspecified: Secondary | ICD-10-CM | POA: Diagnosis not present

## 2016-04-16 ENCOUNTER — Encounter: Payer: Self-pay | Admitting: Family Medicine

## 2016-04-16 ENCOUNTER — Ambulatory Visit (INDEPENDENT_AMBULATORY_CARE_PROVIDER_SITE_OTHER): Payer: Medicare Other | Admitting: Family Medicine

## 2016-04-16 ENCOUNTER — Telehealth: Payer: Self-pay | Admitting: Family Medicine

## 2016-04-16 VITALS — BP 130/72 | HR 76 | Temp 98.3°F | Resp 16 | Ht 65.0 in | Wt 219.0 lb

## 2016-04-16 DIAGNOSIS — E041 Nontoxic single thyroid nodule: Secondary | ICD-10-CM

## 2016-04-16 DIAGNOSIS — Z1231 Encounter for screening mammogram for malignant neoplasm of breast: Secondary | ICD-10-CM | POA: Diagnosis not present

## 2016-04-16 DIAGNOSIS — G4700 Insomnia, unspecified: Secondary | ICD-10-CM | POA: Diagnosis not present

## 2016-04-16 DIAGNOSIS — E7800 Pure hypercholesterolemia, unspecified: Secondary | ICD-10-CM | POA: Diagnosis not present

## 2016-04-16 DIAGNOSIS — R739 Hyperglycemia, unspecified: Secondary | ICD-10-CM

## 2016-04-16 DIAGNOSIS — I10 Essential (primary) hypertension: Secondary | ICD-10-CM | POA: Diagnosis not present

## 2016-04-16 DIAGNOSIS — E039 Hypothyroidism, unspecified: Secondary | ICD-10-CM

## 2016-04-16 DIAGNOSIS — Z780 Asymptomatic menopausal state: Secondary | ICD-10-CM

## 2016-04-16 DIAGNOSIS — N3946 Mixed incontinence: Secondary | ICD-10-CM | POA: Diagnosis not present

## 2016-04-16 DIAGNOSIS — Z0000 Encounter for general adult medical examination without abnormal findings: Secondary | ICD-10-CM

## 2016-04-16 DIAGNOSIS — M4726 Other spondylosis with radiculopathy, lumbar region: Secondary | ICD-10-CM | POA: Diagnosis not present

## 2016-04-16 MED ORDER — OXYBUTYNIN CHLORIDE 5 MG PO TABS: 5.0000 mg | ORAL_TABLET | Freq: Every day | ORAL | Status: DC

## 2016-04-16 MED ORDER — ALPRAZOLAM 0.5 MG PO TABS: 0.2500 mg | ORAL_TABLET | Freq: Every evening | ORAL | Status: DC | PRN

## 2016-04-16 MED ORDER — LOSARTAN POTASSIUM-HCTZ 50-12.5 MG PO TABS: 1.0000 | ORAL_TABLET | Freq: Every day | ORAL | Status: DC

## 2016-04-16 MED ORDER — LEVOTHYROXINE SODIUM 100 MCG PO TABS: 100.0000 ug | ORAL_TABLET | Freq: Every day | ORAL | Status: DC

## 2016-04-16 NOTE — Progress Notes (Signed)
Patient ID: Wendy Barnes, female   DOB: 1941/09/29, 75 y.o.   MRN: [id number]       Patient: Wendy Barnes, Female    DOB: [birth year], 75 y.o.   MRN: [id number] Visit Date: 04/16/2016  Today's Provider: Margarita Rana, MD   Chief Complaint  Patient presents with  . Medicare Wellness   Subjective:    Annual wellness visit Wendy Barnes is a 75 y.o. female. She feels well. She reports exercising none. She reports she is sleeping fairly well.  05/04/15 AWE 02/20/10 Pap-neg 05/02/15 Mammogram-BI-RADS 0 04/09/15 BMD-Normal 10/14/11 Colonoscopy-polyp, diverticulosis, internal hemorrhoids -----------------------------------------------------------   Review of Systems  Constitutional: Negative.   HENT: Negative.   Eyes: Negative.   Respiratory: Negative.   Cardiovascular: Negative.   Gastrointestinal: Negative.   Endocrine: Negative.   Genitourinary: Negative.   Musculoskeletal: Negative.   Skin: Negative.   Allergic/Immunologic: Negative.   Neurological: Negative.   Hematological: Negative.   Psychiatric/Behavioral: Negative.     Social History   Social History  . Marital Status: Widowed    Spouse Name: N/A  . Number of Children: N/A  . Years of Education: N/A   Occupational History  . Not on file.   Social History Main Topics  . Smoking status: Former Smoker -- 1.00 packs/day for 25 years    Types: Cigarettes    Quit date: 12/15/1995  . Smokeless tobacco: Never Used     Comment: quit in 1990's  . Alcohol Use: 0.0 oz/week    0 Standard drinks or equivalent per week     Comment: occasionally  . Drug Use: No  . Sexual Activity: Not on file   Other Topics Concern  . Not on file   Social History Narrative    Past Medical History  Diagnosis Date  . Diabetes mellitus without complication ([place])   . Hypertension   . Thyroid disease      Patient Active Problem List   Diagnosis Date Noted  . Colon polyp 05/16/2015  . Diverticulitis 05/16/2015  .  Dysfunction of eustachian tube 05/16/2015  . Blood glucose elevated 05/16/2015  . Gonalgia 05/16/2015  . Plantar fasciitis 05/16/2015  . 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 Vaginal lesion 05/16/2015  . Burning or prickling sensation 04/10/2010  . Thyroid nodule 12/28/2008  . Cannot sleep 12/10/2007  . Dermatologic disease 09/15/2007  . BP (high blood pressure) 08/28/2006  . Hypercholesteremia 08/28/2006  . Mixed incontinence 08/28/2006    Past Surgical History  Procedure Laterality Date  . Tonsillectomy and adenoidectomy  1950  . Dilation and curettage of uterus  2002  . 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 Thyroidectomy  2010    Her family history includes Stroke in her father.  Death of her daughter.     Previous Medications   ALPRAZOLAM (XANAX) 0.5 MG TABLET    Take 0.5-1 tablets (0.25-0.5 mg total) by mouth at bedtime as needed.   BIOTIN PO    Take 1 tablet by mouth daily.    CALCIUM CARBONATE-VITAMIN D 600-200 MG-UNIT CAPS    1 tablet daily.   LEVOTHYROXINE (SYNTHROID, LEVOTHROID) 100 MCG TABLET    TAKE 1 TABLET (100 MCG TOTAL) BY MOUTH DAILY.   LOSARTAN-HYDROCHLOROTHIAZIDE (HYZAAR) 50-12.5 MG PER TABLET    Take 1 tablet by mouth daily.   MULTIPLE VITAMIN PO    1 tablet daily.   OMEGA-3 FATTY ACIDS (FISH OIL) 1200 MG CAPS    Take by mouth.   OXYBUTYNIN (DITROPAN) 5 MG TABLET  Take 1 tablet (5 mg total) by mouth daily.    Patient Care Team: Margarita Rana, MD as PCP - General (Family Medicine)     Objective:   Vitals: BP 130/72 mmHg  Pulse 76  Temp(Src) 98.3 F (36.8 C) (Oral)  Resp 16  Ht 5\' 5"  (1.651 m)  Wt 219 lb (99.338 kg)  BMI 36.44 kg/m2  Physical Exam  Constitutional: She is oriented to person, place, and time. She appears well-developed and well-nourished.  HENT:  Head: Normocephalic and atraumatic.  Right Ear: Tympanic membrane, external ear and ear canal normal.  Left Ear: Tympanic membrane, external ear and ear canal normal.  Nose:  Nose normal.  Mouth/Throat: Uvula is midline, oropharynx is clear and moist and mucous membranes are normal.  Eyes: Conjunctivae, EOM and lids are normal. Pupils are equal, round, and reactive to light.  Neck: Trachea normal and normal range of motion. Neck supple. Carotid bruit is not present. No thyroid mass and no thyromegaly present.  Cardiovascular: Normal rate, regular rhythm and normal heart sounds.   Pulmonary/Chest: Effort normal and breath sounds normal.  Abdominal: Soft. Normal appearance and bowel sounds are normal. There is no hepatosplenomegaly. There is no tenderness.  Genitourinary: No breast swelling, tenderness or discharge.  Musculoskeletal: Normal range of motion.  Lymphadenopathy:    She has no cervical adenopathy.    She has no axillary adenopathy.  Neurological: She is alert and oriented to person, place, and time. She has normal strength. No cranial nerve deficit.  Skin: Skin is warm, dry and intact.  Psychiatric: She has a normal mood and affect. Her speech is normal and behavior is normal. Judgment and thought content normal. Cognition and memory are normal.    Activities of Daily Living In your present state of health, do you have any difficulty performing the following activities: 04/16/2016  Hearing? N  Vision? N  Difficulty concentrating or making decisions? N  Walking or climbing stairs? N  Dressing or bathing? N  Doing errands, shopping? N    Fall Risk Assessment Fall Risk  04/16/2016 07/16/2015  Falls in the past year? No No     Depression Screen PHQ 2/9 Scores 04/16/2016 07/16/2015  PHQ - 2 Score 0 0    Cognitive Testing - 6-CIT  Correct? Score   What year is it? yes 0 0 or 4  What month is it? yes 0 0 or 3  Memorize:    Wendy Barnes,  44,  [address]  [place],      What time is it? (within 1 hour) yes 0 0 or 3  Count backwards from 20 yes 0 0, 2, or 4  Name the months of the year yes 0 0, 2, or 4  Repeat name & address above yes 0 0, 2, 4, 6, 8,  or 10       TOTAL SCORE  0/28   Interpretation:  Normal  Normal (0-7) Abnormal (8-28)       Assessment & Plan:     Annual Wellness Visit  Reviewed patient's Family Medical History Reviewed and updated list of patient's medical providers Assessment of cognitive impairment was done Assessed patient's functional ability Established a written schedule for health screening [place] Completed and Reviewed  Exercise Activities and Dietary recommendations Goals    None      Immunization History  Administered Date(s) Administered  . Influenza, High Dose Seasonal PF 10/16/2015  . Pneumococcal Conjugate-13 10/30/2014  . Pneumococcal Polysaccharide-23 01/02/2010  .  Zoster 02/08/2009      1. Medicare annual wellness visit, subsequent As above.  2. Essential hypertension - CBC with Differential/Platelet - Comprehensive metabolic panel  3. Blood glucose elevated - Hemoglobin A1c  4. Hypercholesteremia - Lipid Panel With LDL/HDL Ratio  5. Thyroid nodule - TSH  6. Encounter for screening mammogram for breast cancer - MM DIGITAL SCREENING BILATERAL; Future  7. Postmenopausal - DG Bone Density; Future  8. Osteoarthritis of spine with radiculopathy, lumbar region Followed by Dr. Marry Guan.    Patient seen and examined by Dr. Jerrell Belfast, and note scribed by Philbert Riser. Dimas, CMA.  I have reviewed the document for accuracy and completeness and I agree with above. Jerrell Belfast, MD   Margarita Rana, MD    ------------------------------------------------------------------------------------------------------------

## 2016-04-16 NOTE — Telephone Encounter (Signed)
Wendy Barnes with [facility] calling stating she has a question about the order that was put in today for the pt. Please return her call @ [phone]  Thanks, CC

## 2016-04-17 DIAGNOSIS — I10 Essential (primary) hypertension: Secondary | ICD-10-CM | POA: Diagnosis not present

## 2016-04-17 DIAGNOSIS — E041 Nontoxic single thyroid nodule: Secondary | ICD-10-CM | POA: Diagnosis not present

## 2016-04-17 DIAGNOSIS — R739 Hyperglycemia, unspecified: Secondary | ICD-10-CM | POA: Diagnosis not present

## 2016-04-17 DIAGNOSIS — E7800 Pure hypercholesterolemia, unspecified: Secondary | ICD-10-CM | POA: Diagnosis not present

## 2016-04-17 NOTE — Telephone Encounter (Signed)
Number as below disconnected. Renaldo Fiddler, CMA

## 2016-04-18 LAB — CBC WITH DIFFERENTIAL/PLATELET
Basophils Absolute: 0 10*3/uL (ref 0.0–0.2)
Basos: 1 %
EOS (ABSOLUTE): 0.1 10*3/uL (ref 0.0–0.4)
EOS: 1 %
HEMATOCRIT: 40.6 % (ref 34.0–46.6)
HEMOGLOBIN: 13.4 g/dL (ref 11.1–15.9)
IMMATURE GRANS (ABS): 0 10*3/uL (ref 0.0–0.1)
IMMATURE GRANULOCYTES: 0 %
LYMPHS: 38 %
Lymphocytes Absolute: 3.1 10*3/uL (ref 0.7–3.1)
MCH: 27.9 pg (ref 26.6–33.0)
MCHC: 33 g/dL (ref 31.5–35.7)
MCV: 84 fL (ref 79–97)
MONOCYTES: 6 %
Monocytes Absolute: 0.5 10*3/uL (ref 0.1–0.9)
NEUTROS PCT: 54 %
Neutrophils Absolute: 4.3 10*3/uL (ref 1.4–7.0)
Platelets: 266 10*3/uL (ref 150–379)
RBC: 4.81 x10E6/uL (ref 3.77–5.28)
RDW: 14.5 % (ref 12.3–15.4)
WBC: 8 10*3/uL (ref 3.4–10.8)

## 2016-04-18 LAB — COMPREHENSIVE METABOLIC PANEL
ALBUMIN: 4.3 g/dL (ref 3.5–4.8)
ALT: 20 IU/L (ref 0–32)
AST: 20 IU/L (ref 0–40)
Albumin/Globulin Ratio: 1.7 (ref 1.2–2.2)
Alkaline Phosphatase: 76 IU/L (ref 39–117)
BUN/Creatinine Ratio: 18 (ref 12–28)
BUN: 16 mg/dL (ref 8–27)
Bilirubin Total: 0.4 mg/dL (ref 0.0–1.2)
CALCIUM: 9.7 mg/dL (ref 8.7–10.3)
CO2: 26 mmol/L (ref 18–29)
CREATININE: 0.88 mg/dL (ref 0.57–1.00)
Chloride: 100 mmol/L (ref 96–106)
GFR calc Af Amer: 76 mL/min/{1.73_m2} (ref >59)
GFR, EST NON AFRICAN AMERICAN: 66 mL/min/{1.73_m2} (ref >59)
GLOBULIN, TOTAL: 2.5 g/dL (ref 1.5–4.5)
Glucose: 112 mg/dL — ABNORMAL HIGH (ref 65–99)
Potassium: 4.3 mmol/L (ref 3.5–5.2)
SODIUM: 141 mmol/L (ref 134–144)
TOTAL PROTEIN: 6.8 g/dL (ref 6.0–8.5)

## 2016-04-18 LAB — HEMOGLOBIN A1C
ESTIMATED AVERAGE GLUCOSE: 134 mg/dL
Hgb A1c MFr Bld: 6.3 % — ABNORMAL HIGH (ref 4.8–5.6)

## 2016-04-18 LAB — LIPID PANEL WITH LDL/HDL RATIO
Cholesterol, Total: 245 mg/dL — ABNORMAL HIGH (ref 100–199)
HDL: 54 mg/dL (ref >39)
LDL Calculated: 164 mg/dL — ABNORMAL HIGH (ref 0–99)
LDl/HDL Ratio: 3 ratio units (ref 0.0–3.2)
Triglycerides: 137 mg/dL (ref 0–149)
VLDL CHOLESTEROL CAL: 27 mg/dL (ref 5–40)

## 2016-04-18 LAB — TSH: TSH: 2.8 u[IU]/mL (ref 0.450–4.500)

## 2016-04-22 ENCOUNTER — Telehealth: Payer: Self-pay

## 2016-04-22 DIAGNOSIS — E7800 Pure hypercholesterolemia, unspecified: Secondary | ICD-10-CM

## 2016-04-22 MED ORDER — PRAVASTATIN SODIUM 10 MG PO TABS: 10.0000 mg | ORAL_TABLET | Freq: Every day | ORAL | Status: DC

## 2016-04-22 NOTE — Telephone Encounter (Signed)
Advised patient as below. Patient reports that she will try another cholesterol medication. Patient reports that she has taken Lovastatin before in the past. She reports that she had to discontinue due to muscle pain. Patient also reports that she does not take a baby aspirin. Should she start? [facility]. Thanks!

## 2016-04-22 NOTE — Telephone Encounter (Signed)
Please send in rx for Pravastatin 10 mg daily and recheck labs in 4 weeks.  Thanks.

## 2016-04-22 NOTE — Telephone Encounter (Signed)
Medication sent in as below.

## 2016-04-22 NOTE — Telephone Encounter (Signed)
-----   Message from Margarita Rana, MD sent at 04/19/2016  4:36 PM EDT ----- Labs stable. Blood sugar in pre-diabetic range.   Cholesterol is elevated at 245. 10 year risk of heart disease is 14 percent.  Cholesterol medication is recommended. Please see if patient would like to try a medication. Also,  See if patient taking baby aspirin daily .  Thanks.

## 2016-04-23 ENCOUNTER — Encounter: Payer: Self-pay | Admitting: *Deleted

## 2016-04-24 ENCOUNTER — Ambulatory Visit: Payer: Medicare Other | Admitting: Anesthesiology

## 2016-04-24 ENCOUNTER — Encounter
Admission: RE | Disposition: A | Discharge status: Home / Self Care | Payer: Self-pay | Source: Ambulatory Visit | Attending: Unknown Physician Specialty

## 2016-04-24 ENCOUNTER — Ambulatory Visit
Admission: RE | Admit: 2016-04-24 | Discharge: 2016-04-24 | Disposition: A | Discharge status: Home / Self Care | Payer: Medicare Other | Source: Ambulatory Visit | Attending: Unknown Physician Specialty | Admitting: Unknown Physician Specialty

## 2016-04-24 ENCOUNTER — Encounter: Payer: Self-pay | Admitting: *Deleted

## 2016-04-24 DIAGNOSIS — K640 First degree hemorrhoids: Secondary | ICD-10-CM | POA: Diagnosis not present

## 2016-04-24 DIAGNOSIS — D128 Benign neoplasm of rectum: Secondary | ICD-10-CM | POA: Diagnosis not present

## 2016-04-24 DIAGNOSIS — K5790 Diverticulosis of intestine, part unspecified, without perforation or abscess without bleeding: Secondary | ICD-10-CM | POA: Insufficient documentation

## 2016-04-24 DIAGNOSIS — Z87891 Personal history of nicotine dependence: Secondary | ICD-10-CM | POA: Insufficient documentation

## 2016-04-24 DIAGNOSIS — K621 Rectal polyp: Secondary | ICD-10-CM | POA: Diagnosis not present

## 2016-04-24 DIAGNOSIS — K648 Other hemorrhoids: Secondary | ICD-10-CM | POA: Diagnosis not present

## 2016-04-24 DIAGNOSIS — Z79899 Other long term (current) drug therapy: Secondary | ICD-10-CM | POA: Insufficient documentation

## 2016-04-24 DIAGNOSIS — D123 Benign neoplasm of transverse colon: Secondary | ICD-10-CM | POA: Diagnosis not present

## 2016-04-24 DIAGNOSIS — Z1211 Encounter for screening for malignant neoplasm of colon: Secondary | ICD-10-CM | POA: Insufficient documentation

## 2016-04-24 DIAGNOSIS — D122 Benign neoplasm of ascending colon: Secondary | ICD-10-CM | POA: Diagnosis not present

## 2016-04-24 DIAGNOSIS — K635 Polyp of colon: Secondary | ICD-10-CM | POA: Diagnosis not present

## 2016-04-24 DIAGNOSIS — E039 Hypothyroidism, unspecified: Secondary | ICD-10-CM | POA: Insufficient documentation

## 2016-04-24 DIAGNOSIS — Z881 Allergy status to other antibiotic agents status: Secondary | ICD-10-CM | POA: Diagnosis not present

## 2016-04-24 DIAGNOSIS — I10 Essential (primary) hypertension: Secondary | ICD-10-CM | POA: Diagnosis not present

## 2016-04-24 DIAGNOSIS — Z86010 Personal history of colonic polyps: Secondary | ICD-10-CM | POA: Diagnosis not present

## 2016-04-24 DIAGNOSIS — Z880 Allergy status to penicillin: Secondary | ICD-10-CM | POA: Diagnosis not present

## 2016-04-24 DIAGNOSIS — K5730 Diverticulosis of large intestine without perforation or abscess without bleeding: Secondary | ICD-10-CM | POA: Diagnosis not present

## 2016-04-24 HISTORY — PX: COLONOSCOPY WITH PROPOFOL: SHX5780

## 2016-04-24 HISTORY — DX: Hypothyroidism, unspecified: E03.9

## 2016-04-24 HISTORY — DX: Varicella without complication: B01.9

## 2016-04-24 LAB — SURGICAL PATHOLOGY

## 2016-04-24 SURGERY — COLONOSCOPY WITH PROPOFOL
Anesthesia: General

## 2016-04-24 MED ORDER — LIDOCAINE HCL (CARDIAC) 20 MG/ML IV SOLN
INTRAVENOUS | Status: DC | PRN
  Administered 2016-04-24: 30 mg via INTRAVENOUS

## 2016-04-24 MED ORDER — GENTAMICIN IN SALINE 1-0.9 MG/ML-% IV SOLN
100.0000 mg | Freq: Once | INTRAVENOUS | Status: DC
  Administered 2016-04-24: 100 mg via INTRAVENOUS

## 2016-04-24 MED ORDER — GENTAMICIN IN SALINE 1-0.9 MG/ML-% IV SOLN
100.0000 mg | Freq: Once | INTRAVENOUS | Status: DC
  Filled 2016-04-24: qty 100

## 2016-04-24 MED ORDER — SODIUM CHLORIDE 0.9 % IV SOLN
INTRAVENOUS | Status: DC
Start: 2016-04-24 — End: 2016-04-24

## 2016-04-24 MED ORDER — PROPOFOL 10 MG/ML IV BOLUS
INTRAVENOUS | Status: DC | PRN
  Administered 2016-04-24: 80 mg via INTRAVENOUS

## 2016-04-24 MED ORDER — SODIUM CHLORIDE 0.9 % IV SOLN
INTRAVENOUS | Status: DC
Start: 2016-04-24 — End: 2016-04-24
  Administered 2016-04-24: 08:00:00 via INTRAVENOUS

## 2016-04-24 MED ORDER — MIDAZOLAM HCL 5 MG/5ML IJ SOLN
INTRAMUSCULAR | Status: DC | PRN
  Administered 2016-04-24: 1 mg via INTRAVENOUS

## 2016-04-24 MED ORDER — FENTANYL CITRATE (PF) 100 MCG/2ML IJ SOLN
INTRAMUSCULAR | Status: DC | PRN
  Administered 2016-04-24: 50 ug via INTRAVENOUS

## 2016-04-24 MED ORDER — GENTAMICIN SULFATE 40 MG/ML IJ SOLN
100.0000 mg | Freq: Once | INTRAVENOUS | Status: DC
  Filled 2016-04-24: qty 2.5

## 2016-04-24 MED ORDER — VANCOMYCIN HCL IN DEXTROSE 1-5 GM/200ML-% IV SOLN
1000.0000 mg | Freq: Once | INTRAVENOUS | Status: AC
  Administered 2016-04-24: 1000 mg via INTRAVENOUS
  Filled 2016-04-24: qty 200

## 2016-04-24 MED ORDER — VANCOMYCIN HCL 1000 MG IV SOLR: 1000.0000 mg | INTRAVENOUS | Status: DC

## 2016-04-24 MED ORDER — PROPOFOL 500 MG/50ML IV EMUL
INTRAVENOUS | Status: DC | PRN
  Administered 2016-04-24: 180 ug/kg/min via INTRAVENOUS

## 2016-04-24 NOTE — H&P (Signed)
Primary Care Physician:  Margarita Rana, MD Primary Gastroenterologist:  Dr. Vira Agar  Pre-Procedure History & Physical: HPI:  Wendy Barnes is a 75 y.o. female is here for an colonoscopy.   Past Medical History  Diagnosis Date  . Hypertension   . Thyroid disease   . Hypothyroidism   . Varicella     Past Surgical History  Procedure Laterality Date  . Tonsillectomy and adenoidectomy  1950  . Dilation and curettage of uterus  2002  . 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 Thyroidectomy  2010  . Tonsillectomy      Prior to Admission medications   Medication Sig Start Date End Date Taking? Authorizing Provider  Calcium Carbonate-Vitamin D 600-200 MG-UNIT CAPS 1 tablet daily. 08/28/06  Yes Historical Provider, MD  levothyroxine (SYNTHROID, LEVOTHROID) 100 MCG tablet Take 1 tablet (100 mcg total) by mouth daily before breakfast. 04/16/16  Yes Margarita Rana, MD  losartan-hydrochlorothiazide (HYZAAR) 50-12.5 MG tablet Take 1 tablet by mouth daily. 04/16/16  Yes Margarita Rana, MD  Omega-3 Fatty Acids (FISH OIL) 1200 MG CAPS Take by mouth.   Yes Historical Provider, MD  ALPRAZolam Duanne Moron) 0.5 MG tablet Take 0.5-1 tablets (0.25-0.5 mg total) by mouth at bedtime as needed. 04/16/16   Margarita Rana, MD  BIOTIN PO Take 1 tablet by mouth daily.     Historical Provider, MD  MULTIPLE VITAMIN PO 1 tablet daily. 08/28/06   Historical Provider, MD  oxybutynin (DITROPAN) 5 MG tablet Take 1 tablet (5 mg total) by mouth daily. 04/16/16   Margarita Rana, MD  pravastatin (PRAVACHOL) 10 MG tablet Take 1 tablet (10 mg total) by mouth daily. 04/22/16   Margarita Rana, MD    Allergies as of 03/24/2016 - Review Complete 01/16/2016  Allergen Reaction Noted  . Amoxicillin Itching 05/17/2015  . Penicillins  05/16/2015    Family History  Problem Relation Age of Onset  . Stroke Father     Social History   Social History  . Marital Status: Widowed    Spouse Name: N/A  . Number of Children: N/A  . Years of  Education: N/A   Occupational History  . Not on file.   Social History Main Topics  . Smoking status: Former Smoker -- 1.00 packs/day for 25 years    Types: Cigarettes    Quit date: 12/15/1995  . Smokeless tobacco: Never Used     Comment: quit in 1990's  . Alcohol Use: 0.0 oz/week    0 Standard drinks or equivalent per week     Comment: occasionally  . Drug Use: No  . Sexual Activity: Not on file   Other Topics Concern  . Not on file   Social History Narrative    Review of Systems: See HPI, otherwise negative ROS  Physical Exam: BP 137/84 mmHg  Pulse 63  Temp(Src) 97.1 F (36.2 C) (Tympanic)  Resp 18  Ht 5\' 5"  (1.651 m)  Wt 99.338 kg (219 lb)  BMI 36.44 kg/m2  SpO2 99% General:   Alert,  pleasant and cooperative in NAD Head:  Normocephalic and atraumatic. Neck:  Supple; no masses or thyromegaly. Lungs:  Clear throughout to auscultation.    Heart:  Regular rate and rhythm. Abdomen:  Soft, nontender and nondistended. Normal bowel sounds, without guarding, and without rebound.   Neurologic:  Alert and  oriented x4;  grossly normal neurologically.  Impression/Plan: Wendy Barnes is here for an colonoscopy to be performed for [facility] colon polyps  Risks, benefits, limitations, and alternatives regarding  colonoscopy have been reviewed with the patient.  Questions have been answered.  All parties agreeable.   Gaylyn Cheers, MD  04/24/2016, 9:28 AM

## 2016-04-24 NOTE — Anesthesia Preprocedure Evaluation (Signed)
Anesthesia Evaluation  Patient identified by MRN, date of birth, ID band Patient awake    Reviewed: Allergy & Precautions, H&P , NPO status , Patient's Chart, lab work & pertinent test results, reviewed documented beta blocker date and time   History of Anesthesia Complications Negative for: history of anesthetic complications  Airway Mallampati: I  TM Distance: >3 FB Neck ROM: full    Dental no notable dental hx. (+) Edentulous Upper, Upper Dentures   Pulmonary neg pulmonary ROS, former smoker,    Pulmonary exam normal breath sounds clear to auscultation       Cardiovascular Exercise Tolerance: Good hypertension, On Medications (-) angina(-) CAD, (-) Past MI, (-) Cardiac Stents and (-) CABG Normal cardiovascular exam(-) dysrhythmias (-) Valvular Problems/Murmurs Rhythm:regular Rate:Normal     Neuro/Psych negative neurological ROS  negative psych ROS   GI/Hepatic negative GI ROS, Neg liver ROS,   Endo/Other  diabetes (borderline)Hypothyroidism   Renal/GU negative Renal ROS  negative genitourinary   Musculoskeletal   Abdominal   Peds  Hematology negative hematology ROS (+)   Anesthesia Other Findings Past Medical History:   Hypertension                                                 Thyroid disease                                              Hypothyroidism                                               Varicella                                                    Reproductive/Obstetrics negative OB ROS                             Anesthesia Physical Anesthesia Plan  ASA: II  Anesthesia Plan: General   Post-op Pain Management:    Induction:   Airway Management Planned:   Additional Equipment:   Intra-op Plan:   Post-operative Plan:   Informed Consent: I have reviewed the patients History and Physical, chart, labs and discussed the procedure including the risks, benefits and  alternatives for the proposed anesthesia with the patient or authorized representative who has indicated his/her understanding and acceptance.   Dental Advisory Given  Plan Discussed with: Anesthesiologist, CRNA and Surgeon  Anesthesia Plan Comments:         Anesthesia Quick Evaluation

## 2016-04-24 NOTE — Anesthesia Postprocedure Evaluation (Signed)
Anesthesia Post Note  Patient: Equities trader  Procedure(s) Performed: Procedure(s) (LRB): COLONOSCOPY WITH PROPOFOL (N/A)  Patient location during evaluation: Endoscopy Anesthesia Type: General Level of consciousness: awake and alert Pain management: pain level controlled Vital Signs Assessment: post-procedure vital signs reviewed and stable Respiratory status: spontaneous breathing, nonlabored ventilation, respiratory function stable and patient connected to nasal cannula oxygen Cardiovascular status: blood pressure returned to baseline and stable Postop Assessment: no signs of nausea or vomiting Anesthetic complications: no    Last Vitals:  Filed Vitals:   04/24/16 1020 04/24/16 1030  BP: 128/73 140/67  Pulse: 80 71  Temp:    Resp: 15 16    Last Pain: There were no vitals filed for this visit.               Martha Clan

## 2016-04-24 NOTE — Op Note (Signed)
[facility] Gastroenterology Patient Name: Wendy Barnes Procedure Date: 04/24/2016 9:34 AM MRN: [id number] Account #: [id number] Date of Birth: Nov 01, 1941 Admit Type: Outpatient Age: 75 Room: [facility] ENDO ROOM 1 Gender: Female Note Status: Finalized Procedure:            Colonoscopy Indications:          High risk colon cancer surveillance: Personal history                        of colonic polyps Providers:            Manya Silvas, MD Referring MD:         Jerrell Belfast, MD (Referring MD) Medicines:            Propofol per Anesthesia Complications:        No immediate complications. Procedure:            Pre-Anesthesia Assessment:                       - After reviewing the risks and benefits, the patient                        was deemed in satisfactory condition to undergo the                        procedure.                       After obtaining informed consent, the colonoscope was                        passed under direct vision. Throughout the procedure,                        the patient's blood pressure, pulse, and oxygen                        saturations were monitored continuously. The                        Colonoscope was introduced through the anus and                        advanced to the the cecum, identified by appendiceal                        orifice and ileocecal valve. The colonoscopy was                        performed without difficulty. The patient tolerated the                        procedure well. The quality of the bowel preparation                        was good. Findings:      Three sessile polyps were found in the rectum, transverse colon and       ascending colon. The polyps were diminutive in size. These polyps were       removed with a jumbo cold forceps. Resection and retrieval were complete.  The exam was otherwise without abnormality.      Multiple small-medium-mouthed diverticula were found in the sigmoid       colon and descending colon.      Internal hemorrhoids were found during endoscopy. The hemorrhoids were       small and Grade I (internal hemorrhoids that do not prolapse). Impression:           - Three diminutive polyps in the rectum, in the                        transverse colon and in the ascending colon, removed                        with a jumbo cold forceps. Resected and retrieved.                       - The examination was otherwise normal. Recommendation:       - Await pathology results. Manya Silvas, MD 04/24/2016 10:05:11 AM This report has been signed electronically. Number of Addenda: 0 Note Initiated On: 04/24/2016 9:34 AM Scope Withdrawal Time: 0 hours 13 minutes 50 seconds  Total Procedure Duration: 0 hours 20 minutes 20 seconds       [facility]

## 2016-04-24 NOTE — Transfer of Care (Signed)
Immediate Anesthesia Transfer of Care Note  Patient: Wendy Barnes  Procedure(s) Performed: Procedure(s): COLONOSCOPY WITH PROPOFOL (N/A)  Patient Location: PACU and Endoscopy Unit  Anesthesia Type:General  Level of Consciousness: alert  and patient cooperative  Airway & Oxygen Therapy: Patient Spontanous Breathing and Patient connected to nasal cannula oxygen  Post-op Assessment: Report given to RN and Post -op Vital signs reviewed and stable  Post vital signs: Reviewed and stable  Last Vitals:  Filed Vitals:   04/24/16 1000 04/24/16 1006  BP: 104/56 104/56  Pulse: 75 75  Temp: 36.2 C 36.2 C  Resp: 18 10    Last Pain: There were no vitals filed for this visit.       Complications: No apparent anesthesia complications

## 2016-04-26 ENCOUNTER — Encounter: Payer: Self-pay | Admitting: Unknown Physician Specialty

## 2016-05-05 ENCOUNTER — Ambulatory Visit: Payer: Medicare Other | Attending: Family Medicine

## 2016-05-09 DIAGNOSIS — E2839 Other primary ovarian failure: Secondary | ICD-10-CM | POA: Diagnosis not present

## 2016-05-09 DIAGNOSIS — R928 Other abnormal and inconclusive findings on diagnostic imaging of breast: Secondary | ICD-10-CM | POA: Diagnosis not present

## 2016-05-09 DIAGNOSIS — Z780 Asymptomatic menopausal state: Secondary | ICD-10-CM | POA: Diagnosis not present

## 2016-05-13 ENCOUNTER — Other Ambulatory Visit: Payer: Self-pay | Admitting: Family Medicine

## 2016-05-13 DIAGNOSIS — E7800 Pure hypercholesterolemia, unspecified: Secondary | ICD-10-CM

## 2016-05-13 MED ORDER — PRAVASTATIN SODIUM 10 MG PO TABS: 10.0000 mg | ORAL_TABLET | Freq: Every day | ORAL | Status: DC

## 2016-05-13 NOTE — Telephone Encounter (Signed)
Pt needs refill on her pravastatin (PRAVACHOL) 10 MG tablet  She ask for a 90- day RX  She uses [facility], [place]

## 2016-05-14 ENCOUNTER — Encounter: Payer: Self-pay | Admitting: Family Medicine

## 2016-05-31 ENCOUNTER — Other Ambulatory Visit: Payer: Self-pay | Admitting: Family Medicine

## 2016-05-31 DIAGNOSIS — I10 Essential (primary) hypertension: Secondary | ICD-10-CM

## 2016-06-30 ENCOUNTER — Ambulatory Visit (INDEPENDENT_AMBULATORY_CARE_PROVIDER_SITE_OTHER): Payer: Medicare Other | Admitting: Family Medicine

## 2016-06-30 ENCOUNTER — Encounter: Payer: Self-pay | Admitting: Family Medicine

## 2016-06-30 VITALS — BP 146/90 | HR 68 | Temp 98.3°F | Resp 16 | Wt 219.0 lb

## 2016-06-30 DIAGNOSIS — J069 Acute upper respiratory infection, unspecified: Secondary | ICD-10-CM | POA: Diagnosis not present

## 2016-06-30 MED ORDER — HYDROCODONE-HOMATROPINE 5-1.5 MG/5ML PO SYRP: ORAL_SOLUTION | ORAL | Status: DC

## 2016-06-30 NOTE — Progress Notes (Signed)
Subjective:     Patient ID: Wendy Barnes, female   DOB: 1941-02-10, 75 y.o.   MRN: [id number]  HPI  Chief Complaint  Patient presents with  . Cough    and sore throat since Friday, 7/14.   Denies fever, chills or sinus congestion. Has been taking Coricidin and Zinc lozenges for her sx. Leaving for a week long trip to [place] tomorrow.   Review of Systems     Objective:   Physical Exam  Constitutional: She appears well-developed and well-nourished. No distress.  Ears: T.M's intact without inflammation Throat: tonsils absent. Neck: no cervical adenopathy Lungs: clear     Assessment:    1. Upper respiratory infection - HYDROcodone-homatropine (HYCODAN) 5-1.5 MG/5ML syrup; 5 ml 4-6 hours as needed for cough  Dispense: 240 mL; Refill: 0    Plan:    Discussed use of otc medication.

## 2016-06-30 NOTE — Patient Instructions (Signed)
Discussed use of Mucinex, Coricidin, and Delsym for cough.

## 2016-07-08 ENCOUNTER — Ambulatory Visit (INDEPENDENT_AMBULATORY_CARE_PROVIDER_SITE_OTHER): Payer: Medicare Other | Admitting: Family Medicine

## 2016-07-08 ENCOUNTER — Encounter: Payer: Self-pay | Admitting: Family Medicine

## 2016-07-08 VITALS — BP 124/64 | HR 80 | Temp 97.9°F | Wt 219.0 lb

## 2016-07-08 DIAGNOSIS — J069 Acute upper respiratory infection, unspecified: Secondary | ICD-10-CM | POA: Diagnosis not present

## 2016-07-08 MED ORDER — BENZONATATE 100 MG PO CAPS: ORAL_CAPSULE | ORAL | 0 refills | Status: DC

## 2016-07-08 NOTE — Progress Notes (Signed)
Subjective:     Patient ID: [name], female   DOB: Jul 08, 1941, 75 y.o.   MRN: [id number]  HPI  Chief Complaint  Patient presents with  . Cough    Started around July 12th; still not improved. Pt was not able to use Hycodan due to side effects.   Here in f/u of o.v. Of 7/17 for URI. Reports continued cough esp.at night productive of white sputum. Has developed hoarseness but no significant sinus pressure or drainage. Has been taking a multi-symptom otc medication with minimal improvement. States hydrocodone cough syrup kept her up at night.   Review of Systems  Constitutional: Negative for chills and fever.       Objective:   Physical Exam  Constitutional: She appears well-developed and well-nourished. No distress.  Ears: T.M's intact without inflammation Throat: no tonsillar enlargement or exudate Neck: no cervical adenopathy Lungs: clear     Assessment:    1. Upper respiratory infection - benzonatate (TESSALON) 100 MG capsule; One or two pills 3 x day as needed for cough  Dispense: 30 capsule; Refill: 0    Plan:    Continue otc medication. Give herself a few more days to improve.

## 2016-07-08 NOTE — Patient Instructions (Signed)
Continue the multi-symptom over the counter medication. Give yourself a few more days to improve with the cough.

## 2016-08-20 ENCOUNTER — Encounter: Payer: Self-pay | Admitting: Physician Assistant

## 2016-08-20 ENCOUNTER — Ambulatory Visit (INDEPENDENT_AMBULATORY_CARE_PROVIDER_SITE_OTHER): Payer: Medicare Other | Admitting: Physician Assistant

## 2016-08-20 VITALS — BP 122/80 | HR 71 | Temp 97.9°F | Wt 220.2 lb

## 2016-08-20 DIAGNOSIS — Z23 Encounter for immunization: Secondary | ICD-10-CM | POA: Diagnosis not present

## 2016-08-20 DIAGNOSIS — E7800 Pure hypercholesterolemia, unspecified: Secondary | ICD-10-CM

## 2016-08-20 DIAGNOSIS — R739 Hyperglycemia, unspecified: Secondary | ICD-10-CM

## 2016-08-20 DIAGNOSIS — I10 Essential (primary) hypertension: Secondary | ICD-10-CM

## 2016-08-20 NOTE — Progress Notes (Signed)
Patient: Wendy Barnes Female    DOB: Aug 03, 1941   75 y.o.   MRN: [id number] Visit Date: 08/20/2016  Today's Provider: Mar Daring, PA-C   Chief Complaint  Patient presents with  . Hyperglycemia  . Hyperlipidemia  . Follow-up   Subjective:    HPI  Hyperglycemia, Follow-up:   Lab Results  Component Value Date   HGBA1C 6.3 (H) 04/17/2016   HGBA1C 6.4 01/16/2016   HGBA1C 6.2 10/16/2015   GLUCOSE 112 (H) 04/17/2016   GLUCOSE 103 (H) 02/22/2014   GLUCOSE 107 (H) 02/21/2014    Last seen for for this 4 months ago.  Management since then includes none. Current symptoms include none and have been stable.  Weight trend: stable Prior visit with dietician: no Current diet: in general, a "healthy" diet  , well balanced Current exercise: none  Pertinent Labs:    Component Value Date/Time   CHOL 245 (H) 04/17/2016 0803   TRIG 137 04/17/2016 0803   CREATININE 0.88 04/17/2016 0803   CREATININE 0.58 (L) 02/22/2014 0632    Wt Readings from Last 3 Encounters:  08/20/16 220 lb 3.2 oz (99.9 kg)  07/08/16 219 lb (99.3 kg)  06/30/16 219 lb (99.3 kg)      Lipid/Cholesterol, Follow-up:   Last seen for this 4 months ago.  Management since that visit includes started Pravastatin.  Last Lipid Panel:    Component Value Date/Time   CHOL 245 (H) 04/17/2016 0803   TRIG 137 04/17/2016 0803   HDL 54 04/17/2016 0803   LDLCALC 164 (H) 04/17/2016 0803    She reports excellent compliance with treatment. She is not having side effects.   Wt Readings from Last 3 Encounters:  08/20/16 220 lb 3.2 oz (99.9 kg)  07/08/16 219 lb (99.3 kg)  06/30/16 219 lb (99.3 kg)    ------------------------------------------------------------------------    Previous Medications   ALPRAZOLAM (XANAX) 0.5 MG TABLET    Take 0.5-1 tablets (0.25-0.5 mg total) by mouth at bedtime as needed.   ASPIRIN 81 MG TABLET    Take 81 mg by mouth daily.   BIOTIN PO    Take 1 tablet by mouth daily.    CALCIUM CARBONATE-VITAMIN D 600-200 MG-UNIT CAPS    1 tablet daily.   LEVOTHYROXINE (SYNTHROID, LEVOTHROID) 100 MCG TABLET    Take 1 tablet (100 mcg total) by mouth daily before breakfast.   LOSARTAN-HYDROCHLOROTHIAZIDE (HYZAAR) 50-12.5 MG TABLET    Take 1 tablet by mouth daily.   MULTIPLE VITAMIN PO    1 tablet daily.   OMEGA-3 FATTY ACIDS (FISH OIL) 1200 MG CAPS    Take by mouth.   OXYBUTYNIN (DITROPAN) 5 MG TABLET    Take 1 tablet (5 mg total) by mouth daily.   PRAVASTATIN (PRAVACHOL) 10 MG TABLET    Take 1 tablet (10 mg total) by mouth daily.    Review of Systems  Constitutional: Negative.   Respiratory: Negative.   Cardiovascular: Negative.   Gastrointestinal: Negative.   Neurological: Negative.     Social History  Substance Use Topics  . Smoking status: Former Smoker    Packs/day: 1.00    Years: 25.00    Types: Cigarettes    Quit date: 12/15/1995  . Smokeless tobacco: Never Used     Comment: quit in 1990's  . Alcohol use 0.0 oz/week     Comment: occasionally   Objective:   BP 122/80 (BP Location: Right Arm, Patient Position: Sitting, Cuff Size: Normal)   Pulse 71  Temp 97.9 F (36.6 C) (Oral)   Wt 220 lb 3.2 oz (99.9 kg)   BMI 36.64 kg/m   Physical Exam  Constitutional: She appears well-developed and well-nourished. No distress.  Neck: Normal range of motion. Neck supple. No JVD present. No tracheal deviation present. No thyromegaly present.  Cardiovascular: Normal rate, regular rhythm and normal heart sounds.  Exam reveals no gallop and no friction rub.   No murmur heard. Pulmonary/Chest: Effort normal and breath sounds normal. No respiratory distress. She has no wheezes. She has no rales.  Musculoskeletal: She exhibits no edema.  Lymphadenopathy:    She has no cervical adenopathy.  Skin: She is not diaphoretic.  Vitals reviewed.     Assessment & Plan:     1. Essential hypertension Stable. Continue current medical treatment plan. Will check labs as below  and f/u pending results. If labs are stable I will see her back in 8 months for her CPE. - CBC with Differential - Comprehensive Metabolic Panel (CMET)  2. Hypercholesteremia Stable. Continue current medical treatment plan. Will check labs as below and f/u pending results. - Comprehensive Metabolic Panel (CMET) - Lipid Profile  3. Blood glucose elevated Stable. Will check labs as below and f/u pending results. - HgB A1c  4. Need for influenza vaccination Flu vaccine given today without complication. Patient sat upright for 15 minutes to check for adverse reaction before being released. - Flu vaccine HIGH DOSE PF   Follow up: Return in about 8 months (around 04/19/2017) for CPE.

## 2016-08-20 NOTE — Patient Instructions (Signed)

## 2016-08-21 ENCOUNTER — Telehealth: Payer: Self-pay

## 2016-08-21 LAB — CBC WITH DIFFERENTIAL/PLATELET
BASOS: 0 %
Basophils Absolute: 0 10*3/uL (ref 0.0–0.2)
EOS (ABSOLUTE): 0.1 10*3/uL (ref 0.0–0.4)
EOS: 1 %
HEMATOCRIT: 38.1 % (ref 34.0–46.6)
Hemoglobin: 12.8 g/dL (ref 11.1–15.9)
Immature Grans (Abs): 0 10*3/uL (ref 0.0–0.1)
Immature Granulocytes: 0 %
LYMPHS ABS: 2.7 10*3/uL (ref 0.7–3.1)
Lymphs: 32 %
MCH: 28.4 pg (ref 26.6–33.0)
MCHC: 33.6 g/dL (ref 31.5–35.7)
MCV: 85 fL (ref 79–97)
MONOS ABS: 0.5 10*3/uL (ref 0.1–0.9)
Monocytes: 6 %
Neutrophils Absolute: 4.9 10*3/uL (ref 1.4–7.0)
Neutrophils: 61 %
Platelets: 253 10*3/uL (ref 150–379)
RBC: 4.5 x10E6/uL (ref 3.77–5.28)
RDW: 13.8 % (ref 12.3–15.4)
WBC: 8.2 10*3/uL (ref 3.4–10.8)

## 2016-08-21 LAB — COMPREHENSIVE METABOLIC PANEL
A/G RATIO: 1.7 (ref 1.2–2.2)
ALK PHOS: 74 IU/L (ref 39–117)
ALT: 20 IU/L (ref 0–32)
AST: 21 IU/L (ref 0–40)
Albumin: 4.5 g/dL (ref 3.5–4.8)
BUN/Creatinine Ratio: 19 (ref 12–28)
BUN: 15 mg/dL (ref 8–27)
Bilirubin Total: 0.5 mg/dL (ref 0.0–1.2)
CO2: 25 mmol/L (ref 18–29)
Calcium: 9.7 mg/dL (ref 8.7–10.3)
Chloride: 101 mmol/L (ref 96–106)
Creatinine, Ser: 0.78 mg/dL (ref 0.57–1.00)
GFR calc Af Amer: 88 mL/min/{1.73_m2} (ref >59)
GFR, EST NON AFRICAN AMERICAN: 76 mL/min/{1.73_m2} (ref >59)
GLOBULIN, TOTAL: 2.6 g/dL (ref 1.5–4.5)
Glucose: 125 mg/dL — ABNORMAL HIGH (ref 65–99)
POTASSIUM: 4.1 mmol/L (ref 3.5–5.2)
SODIUM: 142 mmol/L (ref 134–144)
Total Protein: 7.1 g/dL (ref 6.0–8.5)

## 2016-08-21 LAB — LIPID PANEL
CHOL/HDL RATIO: 3.9 ratio (ref 0.0–4.4)
CHOLESTEROL TOTAL: 195 mg/dL (ref 100–199)
HDL: 50 mg/dL (ref >39)
LDL Calculated: 119 mg/dL — ABNORMAL HIGH (ref 0–99)
TRIGLYCERIDES: 132 mg/dL (ref 0–149)
VLDL Cholesterol Cal: 26 mg/dL (ref 5–40)

## 2016-08-21 LAB — HEMOGLOBIN A1C
Est. average glucose Bld gHb Est-mCnc: 126 mg/dL
Hgb A1c MFr Bld: 6 % — ABNORMAL HIGH (ref 4.8–5.6)

## 2016-08-21 NOTE — Telephone Encounter (Signed)
Patient advised.

## 2016-08-21 NOTE — Telephone Encounter (Signed)
-----   Message from Mar Daring, PA-C sent at 08/21/2016 12:47 PM EDT ----- HgBA1c is improved to 6.0, cholesterol is drastically improved. Continue good work. All other labs are WNL and stable.

## 2016-10-06 DIAGNOSIS — H2513 Age-related nuclear cataract, bilateral: Secondary | ICD-10-CM | POA: Diagnosis not present

## 2016-10-15 DIAGNOSIS — D239 Other benign neoplasm of skin, unspecified: Secondary | ICD-10-CM | POA: Diagnosis not present

## 2016-10-15 DIAGNOSIS — L820 Inflamed seborrheic keratosis: Secondary | ICD-10-CM | POA: Diagnosis not present

## 2016-10-31 ENCOUNTER — Other Ambulatory Visit: Payer: Self-pay | Admitting: Family Medicine

## 2016-10-31 DIAGNOSIS — E7800 Pure hypercholesterolemia, unspecified: Secondary | ICD-10-CM

## 2016-10-31 NOTE — Telephone Encounter (Signed)
Please review-aa 

## 2016-11-17 ENCOUNTER — Other Ambulatory Visit: Payer: Self-pay | Admitting: Physician Assistant

## 2016-11-17 DIAGNOSIS — F5101 Primary insomnia: Secondary | ICD-10-CM

## 2016-11-17 MED ORDER — ALPRAZOLAM 0.5 MG PO TABS: 0.2500 mg | ORAL_TABLET | Freq: Every evening | ORAL | 1 refills | Status: DC | PRN

## 2016-11-17 NOTE — Progress Notes (Signed)
Refill alprazolam sent to [facility] via fax

## 2017-02-23 DIAGNOSIS — L821 Other seborrheic keratosis: Secondary | ICD-10-CM | POA: Diagnosis not present

## 2017-02-23 DIAGNOSIS — D229 Melanocytic nevi, unspecified: Secondary | ICD-10-CM | POA: Diagnosis not present

## 2017-02-23 DIAGNOSIS — L820 Inflamed seborrheic keratosis: Secondary | ICD-10-CM | POA: Diagnosis not present

## 2017-03-12 ENCOUNTER — Other Ambulatory Visit: Payer: Self-pay | Admitting: Orthopedic Surgery

## 2017-03-12 DIAGNOSIS — Z96651 Presence of right artificial knee joint: Secondary | ICD-10-CM | POA: Diagnosis not present

## 2017-03-12 DIAGNOSIS — M5432 Sciatica, left side: Secondary | ICD-10-CM

## 2017-03-24 ENCOUNTER — Ambulatory Visit
Admission: RE | Admit: 2017-03-24 | Discharge: 2017-03-24 | Disposition: A | Discharge status: Home / Self Care | Payer: Medicare Other | Source: Ambulatory Visit | Attending: Orthopedic Surgery | Admitting: Orthopedic Surgery

## 2017-03-24 DIAGNOSIS — M5432 Sciatica, left side: Secondary | ICD-10-CM | POA: Insufficient documentation

## 2017-03-24 DIAGNOSIS — M5136 Other intervertebral disc degeneration, lumbar region: Secondary | ICD-10-CM | POA: Insufficient documentation

## 2017-03-24 DIAGNOSIS — M545 Low back pain: Secondary | ICD-10-CM | POA: Diagnosis not present

## 2017-04-02 ENCOUNTER — Other Ambulatory Visit: Payer: Self-pay | Admitting: Physician Assistant

## 2017-04-02 MED ORDER — LEVOTHYROXINE SODIUM 100 MCG PO TABS
100.0000 ug | ORAL_TABLET | Freq: Every day | ORAL | 3 refills | Status: DC
Start: 2017-04-02 — End: 2017-09-29

## 2017-04-02 NOTE — Telephone Encounter (Signed)
[facility] faxed a request on the following medication.[facility]  levothyroxine (SYNTHROID, LEVOTHROID) 100 MCG tablet  Take 1 tablet ( 100 MCG Total ) by mouth daily.

## 2017-04-06 DIAGNOSIS — H268 Other specified cataract: Secondary | ICD-10-CM | POA: Diagnosis not present

## 2017-04-06 LAB — HM DIABETES EYE EXAM

## 2017-04-07 DIAGNOSIS — M5442 Lumbago with sciatica, left side: Secondary | ICD-10-CM | POA: Diagnosis not present

## 2017-04-07 DIAGNOSIS — G8929 Other chronic pain: Secondary | ICD-10-CM | POA: Diagnosis not present

## 2017-04-08 DIAGNOSIS — L812 Freckles: Secondary | ICD-10-CM | POA: Diagnosis not present

## 2017-04-08 DIAGNOSIS — I781 Nevus, non-neoplastic: Secondary | ICD-10-CM | POA: Diagnosis not present

## 2017-04-08 DIAGNOSIS — L578 Other skin changes due to chronic exposure to nonionizing radiation: Secondary | ICD-10-CM | POA: Diagnosis not present

## 2017-04-08 DIAGNOSIS — L820 Inflamed seborrheic keratosis: Secondary | ICD-10-CM | POA: Diagnosis not present

## 2017-04-08 DIAGNOSIS — D229 Melanocytic nevi, unspecified: Secondary | ICD-10-CM | POA: Diagnosis not present

## 2017-04-08 DIAGNOSIS — D1800 Hemangioma unspecified site: Secondary | ICD-10-CM | POA: Diagnosis not present

## 2017-04-08 DIAGNOSIS — L821 Other seborrheic keratosis: Secondary | ICD-10-CM | POA: Diagnosis not present

## 2017-04-08 DIAGNOSIS — Z1283 Encounter for screening for malignant neoplasm of skin: Secondary | ICD-10-CM | POA: Diagnosis not present

## 2017-04-08 DIAGNOSIS — Z85828 Personal history of other malignant neoplasm of skin: Secondary | ICD-10-CM | POA: Diagnosis not present

## 2017-04-08 DIAGNOSIS — I8393 Asymptomatic varicose veins of bilateral lower extremities: Secondary | ICD-10-CM | POA: Diagnosis not present

## 2017-04-17 ENCOUNTER — Ambulatory Visit (INDEPENDENT_AMBULATORY_CARE_PROVIDER_SITE_OTHER): Payer: Medicare Other | Admitting: Physician Assistant

## 2017-04-17 ENCOUNTER — Ambulatory Visit (INDEPENDENT_AMBULATORY_CARE_PROVIDER_SITE_OTHER): Payer: Medicare Other

## 2017-04-17 VITALS — BP 128/64 | HR 80 | Temp 98.6°F | Ht 65.0 in | Wt 219.6 lb

## 2017-04-17 VITALS — BP 132/68 | HR 60 | Temp 98.6°F | Resp 16 | Ht 65.0 in | Wt 219.0 lb

## 2017-04-17 DIAGNOSIS — Z1231 Encounter for screening mammogram for malignant neoplasm of breast: Secondary | ICD-10-CM | POA: Diagnosis not present

## 2017-04-17 DIAGNOSIS — Z0000 Encounter for general adult medical examination without abnormal findings: Secondary | ICD-10-CM

## 2017-04-17 DIAGNOSIS — I10 Essential (primary) hypertension: Secondary | ICD-10-CM

## 2017-04-17 DIAGNOSIS — N3946 Mixed incontinence: Secondary | ICD-10-CM

## 2017-04-17 DIAGNOSIS — E7800 Pure hypercholesterolemia, unspecified: Secondary | ICD-10-CM

## 2017-04-17 DIAGNOSIS — E890 Postprocedural hypothyroidism: Secondary | ICD-10-CM

## 2017-04-17 DIAGNOSIS — Z1239 Encounter for other screening for malignant neoplasm of breast: Secondary | ICD-10-CM

## 2017-04-17 DIAGNOSIS — R739 Hyperglycemia, unspecified: Secondary | ICD-10-CM | POA: Diagnosis not present

## 2017-04-17 MED ORDER — LOSARTAN POTASSIUM-HCTZ 50-12.5 MG PO TABS: 1.0000 | ORAL_TABLET | Freq: Every day | ORAL | 3 refills | Status: DC

## 2017-04-17 MED ORDER — OXYBUTYNIN CHLORIDE 5 MG PO TABS
5.0000 mg | ORAL_TABLET | Freq: Every day | ORAL | 3 refills | Status: DC
Start: 2017-04-17 — End: 2018-06-05

## 2017-04-17 MED ORDER — PRAVASTATIN SODIUM 10 MG PO TABS
10.0000 mg | ORAL_TABLET | Freq: Every day | ORAL | 3 refills | Status: DC
Start: 2017-04-17 — End: 2018-05-11

## 2017-04-17 NOTE — Patient Instructions (Signed)
DASH Eating Plan DASH stands for "Dietary Approaches to Stop Hypertension." The DASH eating plan is a healthy eating plan that has been shown to reduce high blood pressure (hypertension). It may also reduce your risk for type 2 diabetes, heart disease, and stroke. The DASH eating plan may also help with weight loss. What are tips for following this plan? General guidelines  Avoid eating more than 2,300 mg (milligrams) of salt (sodium) a day. If you have hypertension, you may need to reduce your sodium intake to 1,500 mg a day.  Limit alcohol intake to no more than 1 drink a day for nonpregnant women and 2 drinks a day for men. One drink equals 12 oz of beer, 5 oz of wine, or 1 oz of hard liquor.  Work with your health care provider to maintain a healthy body weight or to lose weight. Ask what an ideal weight is for you.  Get at least 30 minutes of exercise that causes your heart to beat faster (aerobic exercise) most days of the week. Activities may include walking, swimming, or biking.  Work with your health care provider or diet and nutrition specialist (dietitian) to adjust your eating plan to your individual calorie needs. Reading food labels  Check food labels for the amount of sodium per serving. Choose foods with less than 5 percent of the Daily Value of sodium. Generally, foods with less than 300 mg of sodium per serving fit into this eating plan.  To find whole grains, look for the word "whole" as the first word in the ingredient list. Shopping  Buy products labeled as "low-sodium" or "no salt added."  Buy fresh foods. Avoid canned foods and premade or frozen meals. Cooking  Avoid adding salt when cooking. Use salt-free seasonings or herbs instead of table salt or sea salt. Check with your health care provider or pharmacist before using salt substitutes.  Do not fry foods. Cook foods using healthy methods such as baking, boiling, grilling, and broiling instead.  Cook with  heart-healthy oils, such as olive, canola, soybean, or sunflower oil. Meal planning   Eat a balanced diet that includes: ? 5 or more servings of fruits and vegetables each day. At each meal, try to fill half of your plate with fruits and vegetables. ? Up to 6-8 servings of whole grains each day. ? Less than 6 oz of lean meat, poultry, or fish each day. A 3-oz serving of meat is about the same size as a deck of cards. One egg equals 1 oz. ? 2 servings of low-fat dairy each day. ? A serving of nuts, seeds, or beans 5 times each week. ? Heart-healthy fats. Healthy fats called Omega-3 fatty acids are found in foods such as flaxseeds and coldwater fish, like sardines, salmon, and mackerel.  Limit how much you eat of the following: ? Canned or prepackaged foods. ? Food that is high in trans fat, such as fried foods. ? Food that is high in saturated fat, such as fatty meat. ? Sweets, desserts, sugary drinks, and other foods with added sugar. ? Full-fat dairy products.  Do not salt foods before eating.  Try to eat at least 2 vegetarian meals each week.  Eat more home-cooked food and less restaurant, buffet, and fast food.  When eating at a restaurant, ask that your food be prepared with less salt or no salt, if possible. What foods are recommended? The items listed may not be a complete list. Talk with your dietitian about what   dietary choices are best for you. Grains Whole-grain or whole-wheat bread. Whole-grain or whole-wheat pasta. Brown rice. Oatmeal. Quinoa. Bulgur. Whole-grain and low-sodium cereals. Pita bread. Low-fat, low-sodium crackers. Whole-wheat flour tortillas. Vegetables Fresh or frozen vegetables (raw, steamed, roasted, or grilled). Low-sodium or reduced-sodium tomato and vegetable juice. Low-sodium or reduced-sodium tomato sauce and tomato paste. Low-sodium or reduced-sodium canned vegetables. Fruits All fresh, dried, or frozen fruit. Canned fruit in natural juice (without  added sugar). Meat and other protein foods Skinless chicken or turkey. Ground chicken or turkey. Pork with fat trimmed off. Fish and seafood. Egg whites. Dried beans, peas, or lentils. Unsalted nuts, nut butters, and seeds. Unsalted canned beans. Lean cuts of beef with fat trimmed off. Low-sodium, lean deli meat. Dairy Low-fat (1%) or fat-free (skim) milk. Fat-free, low-fat, or reduced-fat cheeses. Nonfat, low-sodium ricotta or cottage cheese. Low-fat or nonfat yogurt. Low-fat, low-sodium cheese. Fats and oils Soft margarine without trans fats. Vegetable oil. Low-fat, reduced-fat, or light mayonnaise and salad dressings (reduced-sodium). Canola, safflower, olive, soybean, and sunflower oils. Avocado. Seasoning and other foods Herbs. Spices. Seasoning mixes without salt. Unsalted popcorn and pretzels. Fat-free sweets. What foods are not recommended? The items listed may not be a complete list. Talk with your dietitian about what dietary choices are best for you. Grains Baked goods made with fat, such as croissants, muffins, or some breads. Dry pasta or rice meal packs. Vegetables Creamed or fried vegetables. Vegetables in a cheese sauce. Regular canned vegetables (not low-sodium or reduced-sodium). Regular canned tomato sauce and paste (not low-sodium or reduced-sodium). Regular tomato and vegetable juice (not low-sodium or reduced-sodium). Pickles. Olives. Fruits Canned fruit in a light or heavy syrup. Fried fruit. Fruit in cream or butter sauce. Meat and other protein foods Fatty cuts of meat. Ribs. Fried meat. Bacon. Sausage. Bologna and other processed lunch meats. Salami. Fatback. Hotdogs. Bratwurst. Salted nuts and seeds. Canned beans with added salt. Canned or smoked fish. Whole eggs or egg yolks. Chicken or turkey with skin. Dairy Whole or 2% milk, cream, and half-and-half. Whole or full-fat cream cheese. Whole-fat or sweetened yogurt. Full-fat cheese. Nondairy creamers. Whipped toppings.  Processed cheese and cheese spreads. Fats and oils Butter. Stick margarine. Lard. Shortening. Ghee. Bacon fat. Tropical oils, such as coconut, palm kernel, or palm oil. Seasoning and other foods Salted popcorn and pretzels. Onion salt, garlic salt, seasoned salt, table salt, and sea salt. Worcestershire sauce. Tartar sauce. Barbecue sauce. Teriyaki sauce. Soy sauce, including reduced-sodium. Steak sauce. Canned and packaged gravies. Fish sauce. Oyster sauce. Cocktail sauce. Horseradish that you find on the shelf. Ketchup. Mustard. Meat flavorings and tenderizers. Bouillon cubes. Hot sauce and Tabasco sauce. Premade or packaged marinades. Premade or packaged taco seasonings. Relishes. Regular salad dressings. Where to find more information:  National Heart, Lung, and Blood Institute: www.nhlbi.nih.gov  American Heart Association: www.heart.org Summary  The DASH eating plan is a healthy eating plan that has been shown to reduce high blood pressure (hypertension). It may also reduce your risk for type 2 diabetes, heart disease, and stroke.  With the DASH eating plan, you should limit salt (sodium) intake to 2,300 mg a day. If you have hypertension, you may need to reduce your sodium intake to 1,500 mg a day.  When on the DASH eating plan, aim to eat more fresh fruits and vegetables, whole grains, lean proteins, low-fat dairy, and heart-healthy fats.  Work with your health care provider or diet and nutrition specialist (dietitian) to adjust your eating plan to your individual   calorie needs. This information is not intended to replace advice given to you by your health care provider. Make sure you discuss any questions you have with your health care provider. Document Released: 11/20/2011 Document Revised: 11/24/2016 Document Reviewed: 11/24/2016 Elsevier Interactive Patient Education  2017 Elsevier Inc.  

## 2017-04-17 NOTE — Progress Notes (Signed)
Patient: Wendy Barnes Female    DOB: 08-26-1941   76 y.o.   MRN: [id number] Visit Date: 04/17/2017  Today's Provider: Mar Daring, PA-C   Chief Complaint  Patient presents with  . Hypertension  . Hypothyroidism  . Hyperglycemia   Subjective:    HPI  Pt is here to FU on her AWV with the nurse health advisor. Pt is requesting an order for a diagnostic mammogram. Pt receives diagnostic mammos yearly at [facility] on [facility] for dense breasts. Pt denies any breast changes. She reports that she does not have any family history of breast cancer or personal history. She would always get called back with regular screenings and last year they did a diagnostic instead. She was told to just always have a diagnostic ordered.      Hypertension, follow-up:  BP Readings from Last 3 Encounters:  04/17/17 132/68  04/17/17 128/64  08/20/16 122/80    She was last seen for hypertension 8 months ago.  BP at that visit was 122/80. Management since that visit includes check labs. She reports excellent compliance with treatment. She is not having side effects.  She is not exercising due to leg pain. F/B Dr. Marry Guan. She is adherent to low salt diet.   Outside blood pressures are not being checked. She is experiencing none.  Patient denies chest pain, chest pressure/discomfort, claudication, dyspnea, exertional chest pressure/discomfort, fatigue, irregular heart beat, lower extremity edema, near-syncope, orthopnea, palpitations and syncope.   Cardiovascular risk factors include advanced age (older than 78 for men, 21 for women), dyslipidemia, family history of premature cardiovascular disease, hypertension and obesity (BMI >= 30 kg/m2).    Weight trend: stable Wt Readings from Last 3 Encounters:  04/17/17 219 lb (99.3 kg)  04/17/17 219 lb 9.6 oz (99.6 kg)  08/20/16 220 lb 3.2 oz (99.9 kg)    Current diet: in general, a "healthy" diet     ------------------------------------------------------------------------  Hypothyroid, follow-up:  TSH  Date Value Ref Range Status  04/17/2016 2.800 0.450 - 4.500 uIU/mL Final  04/17/2015 1.79 0.41 - 5.90 uIU/mL Final   Wt Readings from Last 3 Encounters:  04/17/17 219 lb (99.3 kg)  04/17/17 219 lb 9.6 oz (99.6 kg)  08/20/16 220 lb 3.2 oz (99.9 kg)    She was last seen for hypothyroid 1 year ago.  Management since that visit includes continuing Levothyroxine 100 mcg. She reports excellent compliance with treatment. She is not having side effects.  She is not exercising. She is experiencing none She denies change in energy level, diarrhea, heat / cold intolerance, nervousness, palpitations and weight changes Weight trend: stable  ------------------------------------------------------------------------  Hyperglycemia, Follow-up:   Lab Results  Component Value Date   HGBA1C 6.0 (H) 08/20/2016   HGBA1C 6.3 (H) 04/17/2016   HGBA1C 6.4 01/16/2016   GLUCOSE 125 (H) 08/20/2016   GLUCOSE 112 (H) 04/17/2016   GLUCOSE 103 (H) 02/22/2014    Last seen for for this 8 months ago.  Management since then includes checking A1C (was improved). Current symptoms include none and have been stable. Pt is on Prednisone for left hip bursitis/tendonitis, prescribed by Dr. Marry Guan.  Pertinent Labs:    Component Value Date/Time   CHOL 195 08/20/2016 1040   TRIG 132 08/20/2016 1040   CHOLHDL 3.9 08/20/2016 1040   CREATININE 0.78 08/20/2016 1040   CREATININE 0.58 (L) 02/22/2014 0632    Wt Readings from Last 3 Encounters:  04/17/17 219 lb (99.3 kg)  04/17/17 219 lb 9.6 oz (99.6 kg)  08/20/16 220 lb 3.2 oz (99.9 kg)     Allergies  Allergen Reactions  . Amoxicillin Itching  . Penicillins      Current Outpatient Prescriptions:  .  ALPRAZolam (XANAX) 0.5 MG tablet, Take 0.5-1 tablets (0.25-0.5 mg total) by mouth at bedtime as needed., Disp: 90 tablet, Rfl: 1 .  aspirin 81 MG  tablet, Take 81 mg by mouth daily., Disp: , Rfl:  .  BIOTIN PO, Take 1 tablet by mouth daily. , Disp: , Rfl:  .  Calcium Carbonate-Vitamin D 600-200 MG-UNIT CAPS, 1 tablet daily., Disp: , Rfl:  .  levothyroxine (SYNTHROID, LEVOTHROID) 100 MCG tablet, Take 1 tablet (100 mcg total) by mouth daily before breakfast., Disp: 90 tablet, Rfl: 3 .  losartan-hydrochlorothiazide (HYZAAR) 50-12.5 MG tablet, Take 1 tablet by mouth daily., Disp: 90 tablet, Rfl: 3 .  MULTIPLE VITAMIN PO, 1 tablet daily., Disp: , Rfl:  .  Omega-3 Fatty Acids (FISH OIL) 1200 MG CAPS, Take by mouth., Disp: , Rfl:  .  oxybutynin (DITROPAN) 5 MG tablet, Take 1 tablet (5 mg total) by mouth daily., Disp: 90 tablet, Rfl: 3 .  pravastatin (PRAVACHOL) 10 MG tablet, TAKE 1 TABLET (10 MG TOTAL) BY MOUTH DAILY., Disp: 90 tablet, Rfl: 1 .  predniSONE (DELTASONE) 10 MG tablet, Take 10 mg by mouth. , Disp: , Rfl:   Review of Systems  Constitutional: Positive for activity change (Prednisone). Negative for appetite change, chills, diaphoresis, fatigue, fever and unexpected weight change.  Respiratory: Negative for cough, shortness of breath and wheezing.   Cardiovascular: Negative for chest pain, palpitations and leg swelling.  Endocrine: Negative for cold intolerance, heat intolerance, polydipsia, polyphagia and polyuria.  Musculoskeletal: Positive for arthralgias (followed by Dr. Marry Guan).    Social History  Substance Use Topics  . Smoking status: Former Smoker    Packs/day: 1.00    Years: 25.00    Types: Cigarettes    Quit date: 12/15/1995  . Smokeless tobacco: Never Used     Comment: quit in 1990's  . Alcohol use 0.0 oz/week     Comment: occasionally   Objective:   BP 132/68 (BP Location: Left Arm, Patient Position: Sitting, Cuff Size: Large)   Pulse 60   Temp 98.6 F (37 C)   Resp 16   Ht 5\' 5"  (1.651 m)   Wt 219 lb (99.3 kg)   BMI 36.44 kg/m  Vitals:   04/17/17 0931  BP: 132/68  Pulse: 60  Resp: 16  Temp: 98.6 F  (37 C)  Weight: 219 lb (99.3 kg)  Height: 5\' 5"  (1.651 m)     Physical Exam  Constitutional: She is oriented to person, place, and time. She appears well-developed and well-nourished. No distress.  HENT:  Head: Normocephalic and atraumatic.  Right Ear: Hearing, tympanic membrane, external ear and ear canal normal.  Left Ear: Hearing, tympanic membrane, external ear and ear canal normal.  Nose: Nose normal.  Mouth/Throat: Uvula is midline, oropharynx is clear and moist and mucous membranes are normal. No oropharyngeal exudate.  Eyes: Conjunctivae and EOM are normal. Pupils are equal, round, and reactive to light. Right eye exhibits no discharge. Left eye exhibits no discharge. No scleral icterus.  Neck: Normal range of motion. Neck supple. No JVD present. Carotid bruit is not present. No tracheal deviation present. No thyromegaly present.  Cardiovascular: Normal rate, regular rhythm, normal heart sounds and intact distal pulses.  Exam reveals no gallop and no friction  rub.   No murmur heard. Pulmonary/Chest: Effort normal and breath sounds normal. No respiratory distress. She has no wheezes. She has no rales. She exhibits no tenderness. Right breast exhibits no inverted nipple, no mass, no nipple discharge, no skin change and no tenderness. Left breast exhibits no inverted nipple, no mass, no nipple discharge, no skin change and no tenderness. Breasts are symmetrical.  Abdominal: Soft. Bowel sounds are normal. She exhibits no distension and no mass. There is no tenderness. There is no rebound and no guarding.  Musculoskeletal: Normal range of motion. She exhibits no edema or tenderness.  Lymphadenopathy:    She has no cervical adenopathy.  Neurological: She is alert and oriented to person, place, and time.  Skin: Skin is warm and dry. No rash noted. She is not diaphoretic.  Psychiatric: She has a normal mood and affect. Her behavior is normal. Judgment and thought content normal.  Vitals  reviewed.     Assessment & Plan:     1. Essential hypertension Stable. Diagnosis pulled for medication refill. Continue current medical treatment plan. Will check labs as below and f/u pending results. - losartan-hydrochlorothiazide (HYZAAR) 50-12.5 MG tablet; Take 1 tablet by mouth daily.  Dispense: 90 tablet; Refill: 3 - CBC w/Diff/Platelet - Comprehensive Metabolic Panel (CMET)  2. Hypercholesteremia Stable. Diagnosis pulled for medication refill. Continue current medical treatment plan. Will check labs as below and f/u pending results. - pravastatin (PRAVACHOL) 10 MG tablet; Take 1 tablet (10 mg total) by mouth daily.  Dispense: 90 tablet; Refill: 3 - CBC w/Diff/Platelet - Comprehensive Metabolic Panel (CMET) - Lipid Profile  3. Blood glucose elevated Will check labs as below and f/u pending results. Advised patient to not get labs until week of 5/14-5/18/18 due to currently being on prednisone. She agrees.  - Comprehensive Metabolic Panel (CMET) - HgB A1c  4. Mixed incontinence Stable. Diagnosis pulled for medication refill. Continue current medical treatment plan. - oxybutynin (DITROPAN) 5 MG tablet; Take 1 tablet (5 mg total) by mouth daily.  Dispense: 90 tablet; Refill: 3  5. Postoperative hypothyroidism Will check labs as below and f/u pending results. - TSH  6. Breast cancer screening Will order diagnostic mammogram from [place].       Mar Daring, PA-C  [facility]

## 2017-04-17 NOTE — Progress Notes (Signed)
Subjective:   Wendy Barnes is a 76 y.o. female who presents for Medicare Annual (Subsequent) preventive examination.  Review of Systems:  N/A Cardiac Risk Factors include: obesity (BMI >30kg/m2);hypertension;dyslipidemia;advanced age (>54men, >60 women)     Objective:     Vitals: BP 128/64 (BP Location: Right Arm, Patient Position: Sitting)   Pulse 80   Temp 98.6 F (37 C)   Ht 5\' 5"  (1.651 m)   Wt 219 lb 9.6 oz (99.6 kg)   BMI 36.54 kg/m   Body mass index is 36.54 kg/m.   Tobacco History  Smoking Status  . Former Smoker  . Packs/day: 1.00  . Years: 25.00  . Types: Cigarettes  . Quit date: 12/15/1995  Smokeless Tobacco  . Never Used    Comment: quit in 1990's     Counseling given: Not Answered   Past Medical History:  Diagnosis Date  . Hypertension   . Hypothyroidism   . Thyroid disease   . Varicella    Past Surgical History:  Procedure Laterality Date  . COLONOSCOPY WITH PROPOFOL N/A 04/24/2016   Procedure: COLONOSCOPY WITH PROPOFOL;  Surgeon: Manya Silvas, MD;  Location: [facility] ENDOSCOPY;  Service: Endoscopy;  Laterality: N/A;  . DILATION AND CURETTAGE OF UTERUS  2002  . 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 THYROIDECTOMY  2010  . TONSILLECTOMY    . TONSILLECTOMY AND ADENOIDECTOMY  1950  . TOTAL KNEE ARTHROPLASTY  2015   right knee   Family History  Problem Relation Age of Onset  . Stroke Father    History  Sexual Activity  . Sexual activity: Not on file    Outpatient Encounter Prescriptions as of 04/17/2017  Medication Sig  . ALPRAZolam (XANAX) 0.5 MG tablet Take 0.5-1 tablets (0.25-0.5 mg total) by mouth at bedtime as needed.  [name] aspirin 81 MG tablet Take 81 mg by mouth daily.  [name] BIOTIN PO Take 1 tablet by mouth daily.   . Calcium Carbonate-Vitamin D 600-200 MG-UNIT CAPS 1 tablet daily.  [name] levothyroxine (SYNTHROID, LEVOTHROID) 100 MCG tablet Take 1 tablet (100 mcg total) by mouth daily before breakfast.  .  losartan-hydrochlorothiazide (HYZAAR) 50-12.5 MG tablet Take 1 tablet by mouth daily.  . MULTIPLE VITAMIN PO 1 tablet daily.  . Omega-3 Fatty Acids (FISH OIL) 1200 MG CAPS Take by mouth.  . oxybutynin (DITROPAN) 5 MG tablet Take 1 tablet (5 mg total) by mouth daily.  . pravastatin (PRAVACHOL) 10 MG tablet TAKE 1 TABLET (10 MG TOTAL) BY MOUTH DAILY.  [name] predniSONE (DELTASONE) 10 MG tablet Take 10 mg by mouth.    No facility-administered encounter medications on file as of 04/17/2017.     Activities of Daily Living In your present state of health, do you have any difficulty performing the following activities: 04/17/2017  Hearing? N  Vision? N  Difficulty concentrating or making decisions? N  Walking or climbing stairs? Y  Dressing or bathing? N  Doing errands, shopping? N  Preparing Food and eating ? N  Using the Toilet? N  In the past six months, have you accidently leaked urine? Y  Do you have problems with loss of bowel control? N  Managing your Medications? N  Managing your Finances? N  Housekeeping or managing your Housekeeping? N  Some recent data might be hidden    Patient Care Team: Mar Daring, PA-C as PCP - General (Family Medicine)    Assessment:     Exercise Activities and Dietary recommendations Current Exercise Habits: The  patient does not participate in regular exercise at present;Home exercise routine (discomfort in leg ), Type of exercise: walking, Time (Minutes): 15 (to 20 minutes), Frequency (Times/Week): 3, Weekly Exercise (Minutes/Week): 45, Intensity: Mild, Exercise limited by: orthopedic condition(s)  Goals    . Reduce portion size          Recommend to work on portion control for daily meals.       Fall Risk Fall Risk  04/17/2017 04/16/2016 07/16/2015  Falls in the past year? No No No   Depression Screen PHQ 2/9 Scores 04/17/2017 04/17/2017 04/16/2016 07/16/2015  PHQ - 2 Score 1 1 0 0  PHQ- 9 Score 3 - - -     Cognitive Function     6CIT Screen  04/17/2017  What Year? 0 points  What month? 0 points  What time? 0 points  Count back from 20 0 points  Months in reverse 0 points  Repeat phrase 4 points  Total Score 4    Immunization History  Administered Date(s) Administered  . Influenza, High Dose Seasonal PF 10/16/2015, 08/20/2016  . Pneumococcal Conjugate-13 10/30/2014  . Pneumococcal Polysaccharide-23 01/02/2010  . Zoster 02/08/2009   Screening Tests Health Maintenance  Topic Date Due  . TETANUS/TDAP  09/03/1962  . INFLUENZA VACCINE  07/15/2017  . MAMMOGRAM  04/16/2018  . COLONOSCOPY  04/24/2026  . DEXA SCAN  Completed  . PNA vac Low Risk Adult  Completed      Plan:      I have personally reviewed and noted the following in the patient's chart:   . Medical and social history . Use of alcohol, tobacco or illicit drugs  . Current medications and supplements . Functional ability and status . Nutritional status . Physical activity . Advanced directives . List of other physicians . Hospitalizations, surgeries, and ER visits in previous 12 months . Vitals . Screenings to include cognitive, depression, and falls . Referrals and appointments  In addition, I have reviewed and discussed with patient certain preventive protocols, quality metrics, and best practice recommendations. A written personalized care plan for preventive services as well as general preventive health recommendations were provided to patient.     [address], Tiffany A, LPN  03/22/2499    MD Recommendations: Patient needs mammogram done at end of this month, possibly needs diagnostic mammogram order. Patient declines tetanus vaccine today.   I have reviewed the documentation and information obtained by Tyler Aas, LPN in the above chart and agree as above. I was available for consultation if any questions or issues arose.  Fenton Malling, PA-C

## 2017-04-17 NOTE — Patient Instructions (Signed)
Wendy Barnes , Thank you for taking time to come for your Medicare Wellness Visit. I appreciate your ongoing commitment to your health goals. Please review the following plan we discussed and let me know if I can assist you in the future.   Screening recommendations/referrals: Colonoscopy: Completed 04/24/2016,no longer required Mammogram: Completed 05/09/2016, due 04/2017 will call to schedule  Bone Density: Completd 05/09/2016, no longer required Recommended yearly ophthalmology/optometry visit for glaucoma screening and checkup Recommended yearly dental visit for hygiene and checkup  Vaccinations: Influenza vaccine: up to date, due 08/2017 Pneumococcal vaccine: completed series Tdap vaccine: due, declined today  Shingles vaccine: completed 02/08/2009  Advanced directives: Advance directive discussed with you today. I have provided a copy for you to complete at home and have notarized. Once this is complete please bring a copy in to our office so we can scan it into your chart.  Conditions/risks identified: Recommend working on portion control for daily meals.   Next appointment: none, follow up for annual wellness visit in one year.   Preventive Care 74 Years and Older, Female Preventive care refers to lifestyle choices and visits with your health care provider that can promote health and wellness. What does preventive care include?  A yearly physical exam. This is also called an annual well check.  Dental exams once or twice a year.  Routine eye exams. Ask your health care provider how often you should have your eyes checked.  Personal lifestyle choices, including:  Daily care of your teeth and gums.  Regular physical activity.  Eating a healthy diet.  Avoiding tobacco and drug use.  Limiting alcohol use.  Practicing safe sex.  Taking low-dose aspirin every day.  Taking vitamin and mineral supplements as recommended by your health care provider. What happens during an  annual well check? The services and screenings done by your health care provider during your annual well check will depend on your age, overall health, lifestyle risk factors, and family history of disease. Counseling  Your health care provider may ask you questions about your:  Alcohol use.  Tobacco use.  Drug use.  Emotional well-being.  Home and relationship well-being.  Sexual activity.  Eating habits.  History of falls.  Memory and ability to understand (cognition).  Work and work Statistician.  Reproductive health. Screening  You may have the following tests or measurements:  Height, weight, and BMI.  Blood pressure.  Lipid and cholesterol levels. These may be checked every 5 years, or more frequently if you are over 74 years old.  Skin check.  Lung cancer screening. You may have this screening every year starting at age 74 if you have a 30-pack-year history of smoking and currently smoke or have quit within the past 15 years.  Fecal occult blood test (FOBT) of the stool. You may have this test every year starting at age 74.  Flexible sigmoidoscopy or colonoscopy. You may have a sigmoidoscopy every 5 years or a colonoscopy every 10 years starting at age 74.  Hepatitis C blood test.  Hepatitis B blood test.  Sexually transmitted disease (STD) testing.  Diabetes screening. This is done by checking your blood sugar (glucose) after you have not eaten for a while (fasting). You may have this done every 1-3 years.  Bone density scan. This is done to screen for osteoporosis. You may have this done starting at age 74.  Mammogram. This may be done every 1-2 years. Talk to your health care provider about how often you should  have regular mammograms. Talk with your health care provider about your test results, treatment options, and if necessary, the need for more tests. Vaccines  Your health care provider may recommend certain vaccines, such as:  Influenza  vaccine. This is recommended every year.  Tetanus, diphtheria, and acellular pertussis (Tdap, Td) vaccine. You may need a Td booster every 10 years.  Zoster vaccine. You may need this after age 62.  Pneumococcal 13-valent conjugate (PCV13) vaccine. One dose is recommended after age 74.  Pneumococcal polysaccharide (PPSV23) vaccine. One dose is recommended after age 74. Talk to your health care provider about which screenings and vaccines you need and how often you need them. This information is not intended to replace advice given to you by your health care provider. Make sure you discuss any questions you have with your health care provider. Document Released: 12/28/2015 Document Revised: 08/20/2016 Document Reviewed: 10/02/2015 Elsevier Interactive Patient Education  2017 [place] Prevention in the Home Falls can cause injuries. They can happen to people of all ages. There are many things you can do to make your home safe and to help prevent falls. What can I do on the outside of my home?  Regularly fix the edges of walkways and driveways and fix any cracks.  Remove anything that might make you trip as you walk through a door, such as a raised step or threshold.  Trim any bushes or trees on the path to your home.  Use bright outdoor lighting.  Clear any walking paths of anything that might make someone trip, such as rocks or tools.  Regularly check to see if handrails are loose or broken. Make sure that both sides of any steps have handrails.  Any raised decks and porches should have guardrails on the edges.  Have any leaves, snow, or ice cleared regularly.  Use sand or salt on walking paths during winter.  Clean up any spills in your garage right away. This includes oil or grease spills. What can I do in the bathroom?  Use night lights.  Install grab bars by the toilet and in the tub and shower. Do not use towel bars as grab bars.  Use non-skid mats or decals  in the tub or shower.  If you need to sit down in the shower, use a plastic, non-slip stool.  Keep the floor dry. Clean up any water that spills on the floor as soon as it happens.  Remove soap buildup in the tub or shower regularly.  Attach bath mats securely with double-sided non-slip rug tape.  Do not have throw rugs and other things on the floor that can make you trip. What can I do in the bedroom?  Use night lights.  Make sure that you have a light by your bed that is easy to reach.  Do not use any sheets or blankets that are too big for your bed. They should not hang down onto the floor.  Have a firm chair that has side arms. You can use this for support while you get dressed.  Do not have throw rugs and other things on the floor that can make you trip. What can I do in the kitchen?  Clean up any spills right away.  Avoid walking on wet floors.  Keep items that you use a lot in easy-to-reach places.  If you need to reach something above you, use a strong step stool that has a grab bar.  Keep electrical cords out of  the way.  Do not use floor polish or wax that makes floors slippery. If you must use wax, use non-skid floor wax.  Do not have throw rugs and other things on the floor that can make you trip. What can I do with my stairs?  Do not leave any items on the stairs.  Make sure that there are handrails on both sides of the stairs and use them. Fix handrails that are broken or loose. Make sure that handrails are as long as the stairways.  Check any carpeting to make sure that it is firmly attached to the stairs. Fix any carpet that is loose or worn.  Avoid having throw rugs at the top or bottom of the stairs. If you do have throw rugs, attach them to the floor with carpet tape.  Make sure that you have a light switch at the top of the stairs and the bottom of the stairs. If you do not have them, ask someone to add them for you. What else can I do to help  prevent falls?  Wear shoes that:  Do not have high heels.  Have rubber bottoms.  Are comfortable and fit you well.  Are closed at the toe. Do not wear sandals.  If you use a stepladder:  Make sure that it is fully opened. Do not climb a closed stepladder.  Make sure that both sides of the stepladder are locked into place.  Ask someone to hold it for you, if possible.  Clearly mark and make sure that you can see:  Any grab bars or handrails.  First and last steps.  Where the edge of each step is.  Use tools that help you move around (mobility aids) if they are needed. These include:  Canes.  Walkers.  Scooters.  Crutches.  Turn on the lights when you go into a dark area. Replace any light bulbs as soon as they burn out.  Set up your furniture so you have a clear path. Avoid moving your furniture around.  If any of your floors are uneven, fix them.  If there are any pets around you, be aware of where they are.  Review your medicines with your doctor. Some medicines can make you feel dizzy. This can increase your chance of falling. Ask your doctor what other things that you can do to help prevent falls. This information is not intended to replace advice given to you by your health care provider. Make sure you discuss any questions you have with your health care provider. Document Released: 09/27/2009 Document Revised: 05/08/2016 Document Reviewed: 01/05/2015 Elsevier Interactive Patient Education  2017 [employer].

## 2017-04-29 ENCOUNTER — Other Ambulatory Visit: Payer: Self-pay | Admitting: *Deleted

## 2017-04-29 ENCOUNTER — Inpatient Hospital Stay
Admission: RE | Admit: 2017-04-29 | Discharge: 2017-04-29 | Disposition: A | Discharge status: Home / Self Care | Payer: Self-pay | Source: Ambulatory Visit | Attending: *Deleted | Admitting: *Deleted

## 2017-04-29 DIAGNOSIS — Z9289 Personal history of other medical treatment: Secondary | ICD-10-CM

## 2017-04-30 DIAGNOSIS — E890 Postprocedural hypothyroidism: Secondary | ICD-10-CM | POA: Diagnosis not present

## 2017-04-30 DIAGNOSIS — I10 Essential (primary) hypertension: Secondary | ICD-10-CM | POA: Diagnosis not present

## 2017-04-30 DIAGNOSIS — E7800 Pure hypercholesterolemia, unspecified: Secondary | ICD-10-CM | POA: Diagnosis not present

## 2017-04-30 DIAGNOSIS — R739 Hyperglycemia, unspecified: Secondary | ICD-10-CM | POA: Diagnosis not present

## 2017-04-30 NOTE — Addendum Note (Signed)
Addended by: Mar Daring on: 04/30/2017 04:26 PM   Modules accepted: Orders

## 2017-05-01 ENCOUNTER — Telehealth: Payer: Self-pay

## 2017-05-01 DIAGNOSIS — E119 Type 2 diabetes mellitus without complications: Secondary | ICD-10-CM

## 2017-05-01 LAB — CBC WITH DIFFERENTIAL/PLATELET
BASOS: 0 %
Basophils Absolute: 0 10*3/uL (ref 0.0–0.2)
EOS (ABSOLUTE): 0.1 10*3/uL (ref 0.0–0.4)
EOS: 1 %
HEMATOCRIT: 40.3 % (ref 34.0–46.6)
Hemoglobin: 13.6 g/dL (ref 11.1–15.9)
IMMATURE GRANULOCYTES: 0 %
Immature Grans (Abs): 0 10*3/uL (ref 0.0–0.1)
LYMPHS: 35 %
Lymphocytes Absolute: 2.9 10*3/uL (ref 0.7–3.1)
MCH: 28.7 pg (ref 26.6–33.0)
MCHC: 33.7 g/dL (ref 31.5–35.7)
MCV: 85 fL (ref 79–97)
Monocytes Absolute: 0.6 10*3/uL (ref 0.1–0.9)
Monocytes: 7 %
NEUTROS ABS: 4.6 10*3/uL (ref 1.4–7.0)
NEUTROS PCT: 57 %
PLATELETS: 236 10*3/uL (ref 150–379)
RBC: 4.74 x10E6/uL (ref 3.77–5.28)
RDW: 14.6 % (ref 12.3–15.4)
WBC: 8.2 10*3/uL (ref 3.4–10.8)

## 2017-05-01 LAB — COMPREHENSIVE METABOLIC PANEL
A/G RATIO: 1.5 (ref 1.2–2.2)
ALT: 28 IU/L (ref 0–32)
AST: 23 IU/L (ref 0–40)
Albumin: 4.2 g/dL (ref 3.5–4.8)
Alkaline Phosphatase: 82 IU/L (ref 39–117)
BILIRUBIN TOTAL: 0.4 mg/dL (ref 0.0–1.2)
BUN/Creatinine Ratio: 21 (ref 12–28)
BUN: 16 mg/dL (ref 8–27)
CO2: 22 mmol/L (ref 18–29)
Calcium: 9.6 mg/dL (ref 8.7–10.3)
Chloride: 107 mmol/L — ABNORMAL HIGH (ref 96–106)
Creatinine, Ser: 0.75 mg/dL (ref 0.57–1.00)
GFR calc Af Amer: 91 mL/min/{1.73_m2} (ref >59)
GFR, EST NON AFRICAN AMERICAN: 79 mL/min/{1.73_m2} (ref >59)
GLOBULIN, TOTAL: 2.8 g/dL (ref 1.5–4.5)
Glucose: 132 mg/dL — ABNORMAL HIGH (ref 65–99)
POTASSIUM: 4.4 mmol/L (ref 3.5–5.2)
SODIUM: 144 mmol/L (ref 134–144)
Total Protein: 7 g/dL (ref 6.0–8.5)

## 2017-05-01 LAB — HEMOGLOBIN A1C
ESTIMATED AVERAGE GLUCOSE: 143 mg/dL
Hgb A1c MFr Bld: 6.6 % — ABNORMAL HIGH (ref 4.8–5.6)

## 2017-05-01 LAB — LIPID PANEL
CHOL/HDL RATIO: 4.3 ratio (ref 0.0–4.4)
Cholesterol, Total: 213 mg/dL — ABNORMAL HIGH (ref 100–199)
HDL: 49 mg/dL (ref >39)
LDL Calculated: 135 mg/dL — ABNORMAL HIGH (ref 0–99)
TRIGLYCERIDES: 147 mg/dL (ref 0–149)
VLDL Cholesterol Cal: 29 mg/dL (ref 5–40)

## 2017-05-01 LAB — TSH: TSH: 4.82 u[IU]/mL — AB (ref 0.450–4.500)

## 2017-05-01 MED ORDER — METFORMIN HCL 500 MG PO TABS: 500.0000 mg | ORAL_TABLET | Freq: Every day | ORAL | 1 refills | Status: DC

## 2017-05-01 NOTE — Telephone Encounter (Signed)
Pt advised.  She agreed to start Metformin.  Please send to [facility].   Thanks,   -Mickel Baas

## 2017-05-01 NOTE — Telephone Encounter (Signed)
Sent in

## 2017-05-01 NOTE — Telephone Encounter (Signed)
-----   Message from Mar Daring, PA-C sent at 05/01/2017 11:00 AM EDT ----- Cholesterol is up from last year. Make sure to be taking pravastatin 10mg  daily. A1c also up to 6.6 which is now diabetic reading. Would recommend starting low dose metformin to keep sugar low. If not wanting to start would recommend being very careful and starting to limit red meats, fatty foods, carbohydrates and sugars. Try to increase physical activity to get in 150 min per week as tolerated. We should recheck A1c and cholesterol in 6 months.

## 2017-05-05 DIAGNOSIS — G8929 Other chronic pain: Secondary | ICD-10-CM | POA: Diagnosis not present

## 2017-05-05 DIAGNOSIS — M5442 Lumbago with sciatica, left side: Secondary | ICD-10-CM | POA: Diagnosis not present

## 2017-05-20 ENCOUNTER — Ambulatory Visit
Admission: RE | Admit: 2017-05-20 | Discharge: 2017-05-20 | Disposition: A | Discharge status: Home / Self Care | Payer: Medicare Other | Source: Ambulatory Visit | Attending: Physician Assistant | Admitting: Physician Assistant

## 2017-05-20 DIAGNOSIS — Z1231 Encounter for screening mammogram for malignant neoplasm of breast: Secondary | ICD-10-CM | POA: Insufficient documentation

## 2017-05-20 DIAGNOSIS — Z1239 Encounter for other screening for malignant neoplasm of breast: Secondary | ICD-10-CM

## 2017-05-20 HISTORY — DX: Type 2 diabetes mellitus without complications: E11.9

## 2017-05-21 ENCOUNTER — Telehealth: Payer: Self-pay

## 2017-05-21 NOTE — Telephone Encounter (Signed)
Patient advised as below.  

## 2017-05-21 NOTE — Telephone Encounter (Signed)
-----   Message from Mar Daring, [place] sent at 05/20/2017  4:44 PM EDT ----- Normal mammogram. Repeat screening in one year.

## 2017-09-28 ENCOUNTER — Ambulatory Visit: Payer: Medicare Other | Admitting: Physician Assistant

## 2017-09-28 ENCOUNTER — Encounter: Payer: Self-pay | Admitting: Physician Assistant

## 2017-09-28 ENCOUNTER — Ambulatory Visit (INDEPENDENT_AMBULATORY_CARE_PROVIDER_SITE_OTHER): Payer: Medicare Other | Admitting: Physician Assistant

## 2017-09-28 VITALS — BP 140/70 | HR 72 | Temp 98.5°F | Resp 16 | Ht 65.0 in | Wt 205.6 lb

## 2017-09-28 DIAGNOSIS — Z23 Encounter for immunization: Secondary | ICD-10-CM | POA: Diagnosis not present

## 2017-09-28 DIAGNOSIS — E039 Hypothyroidism, unspecified: Secondary | ICD-10-CM | POA: Diagnosis not present

## 2017-09-28 DIAGNOSIS — E7800 Pure hypercholesterolemia, unspecified: Secondary | ICD-10-CM

## 2017-09-28 DIAGNOSIS — I10 Essential (primary) hypertension: Secondary | ICD-10-CM

## 2017-09-28 DIAGNOSIS — E119 Type 2 diabetes mellitus without complications: Secondary | ICD-10-CM

## 2017-09-28 DIAGNOSIS — F5101 Primary insomnia: Secondary | ICD-10-CM | POA: Diagnosis not present

## 2017-09-28 LAB — POCT UA - MICROALBUMIN: MICROALBUMIN (UR) POC: 20 mg/L

## 2017-09-28 LAB — TSH: TSH: 1.03 mIU/L (ref 0.40–4.50)

## 2017-09-28 LAB — POCT GLYCOSYLATED HEMOGLOBIN (HGB A1C)
Est. average glucose Bld gHb Est-mCnc: 128
Hemoglobin A1C: 5.8

## 2017-09-28 MED ORDER — ALPRAZOLAM 0.5 MG PO TABS: 0.2500 mg | ORAL_TABLET | Freq: Every evening | ORAL | 1 refills | Status: DC | PRN

## 2017-09-28 MED ORDER — METFORMIN HCL 500 MG PO TABS: 500.0000 mg | ORAL_TABLET | Freq: Every day | ORAL | 1 refills | Status: DC

## 2017-09-28 NOTE — Patient Instructions (Signed)

## 2017-09-28 NOTE — Progress Notes (Signed)
Patient: Wendy Barnes Female    DOB: July 28, 1941   76 y.o.   MRN: [id number] Visit Date: 09/28/2017  Today's Provider: Mar Daring, PA-C   Chief Complaint  Patient presents with  . Diabetes  . Hypertension  . Hyperlipidemia  . Hypothyroidism   Subjective:    HPI  Diabetes Mellitus Type II, Follow-up:   Lab Results  Component Value Date   HGBA1C 5.8 09/28/2017   HGBA1C 6.6 (H) 04/30/2017   HGBA1C 6.0 (H) 08/20/2016   Last seen for diabetes 6 months ago.  Management since then includes starting Metformin. She reports excellent compliance with treatment. She is not having side effects. none Current symptoms include paresthesia of the feet and have been stable. Home blood sugar records: fasting range: not being checked  Episodes of hypoglycemia? no   Current Insulin Regimen: none Most Recent Eye Exam: UTD AEC Weight trend: stable Prior visit with dietician: no Current diet: in general, a "healthy" diet   Current exercise: none  ------------------------------------------------------------------------   Hypertension, follow-up:  BP Readings from Last 3 Encounters:  09/28/17 140/70  04/17/17 132/68  04/17/17 128/64    She was last seen for hypertension 6 months ago.  BP at that visit was 132/68. Management since that visit includes no changes.She reports excellent compliance with treatment. She is not having side effects.  She is not exercising. She is adherent to low salt diet.   Outside blood pressures are not being checked. She is experiencing none.  Patient denies chest pain.   Cardiovascular risk factors include advanced age (older than 34 for men, 33 for women), diabetes mellitus and hypertension.  Use of agents associated with hypertension: none.   ------------------------------------------------------------------------    Lipid/Cholesterol, Follow-up:   Last seen for this 6 months ago.  Management since that visit includes  checking labs.  Last Lipid Panel:    Component Value Date/Time   CHOL 213 (H) 04/30/2017 0808   TRIG 147 04/30/2017 0808   HDL 49 04/30/2017 0808   CHOLHDL 4.3 04/30/2017 0808   LDLCALC 135 (H) 04/30/2017 0808    She reports excellent compliance with treatment. She is not having side effects.   Wt Readings from Last 3 Encounters:  09/28/17 205 lb 9.6 oz (93.3 kg)  04/17/17 219 lb (99.3 kg)  04/17/17 219 lb 9.6 oz (99.6 kg)    ------------------------------------------------------------------------   Hypothyroid, follow-up:  TSH  Date Value Ref Range Status  04/30/2017 4.820 (H) 0.450 - 4.500 uIU/mL Final  04/17/2016 2.800 0.450 - 4.500 uIU/mL Final  04/17/2015 1.79 0.41 - 5.90 uIU/mL Final   Wt Readings from Last 3 Encounters:  09/28/17 205 lb 9.6 oz (93.3 kg)  04/17/17 219 lb (99.3 kg)  04/17/17 219 lb 9.6 oz (99.6 kg)    She was last seen for hypothyroid 6 months ago.  Management since that visit includes check labs. She reports excellent compliance with treatment. She is not having side effects.  She is not exercising. She is experiencing none She denies change in energy level, diarrhea, heat / cold intolerance, nervousness, palpitations and weight changes Weight trend: decreasing steadily  ------------------------------------------------------------------------  Follow up for insomnia  The patient was last seen for this 6 months ago. Changes made at last visit include no changes.  She reports excellent compliance with treatment. She feels that condition is Improved. Patient reports she is still having to take Xanax for sleep most nights.  She is not having side effects.   ------------------------------------------------------------------------------------  Allergies  Allergen Reactions  . Amoxicillin Itching  . Penicillins      Current Outpatient Prescriptions:  .  ALPRAZolam (XANAX) 0.5 MG tablet, Take 0.5-1 tablets (0.25-0.5 mg total) by  mouth at bedtime as needed., Disp: 90 tablet, Rfl: 1 .  aspirin 81 MG tablet, Take 81 mg by mouth daily., Disp: , Rfl:  .  BIOTIN PO, Take 1 tablet by mouth daily. , Disp: , Rfl:  .  Calcium Carbonate-Vitamin D 600-200 MG-UNIT CAPS, 1 tablet daily., Disp: , Rfl:  .  levothyroxine (SYNTHROID, LEVOTHROID) 100 MCG tablet, Take 1 tablet (100 mcg total) by mouth daily before breakfast., Disp: 90 tablet, Rfl: 3 .  losartan-hydrochlorothiazide (HYZAAR) 50-12.5 MG tablet, Take 1 tablet by mouth daily., Disp: 90 tablet, Rfl: 3 .  metFORMIN (GLUCOPHAGE) 500 MG tablet, Take 1 tablet (500 mg total) by mouth daily with breakfast., Disp: 90 tablet, Rfl: 1 .  MULTIPLE VITAMIN PO, 1 tablet daily., Disp: , Rfl:  .  Omega-3 Fatty Acids (FISH OIL) 1200 MG CAPS, Take by mouth., Disp: , Rfl:  .  oxybutynin (DITROPAN) 5 MG tablet, Take 1 tablet (5 mg total) by mouth daily., Disp: 90 tablet, Rfl: 3 .  pravastatin (PRAVACHOL) 10 MG tablet, Take 1 tablet (10 mg total) by mouth daily., Disp: 90 tablet, Rfl: 3  Review of Systems  Constitutional: Negative.   Cardiovascular: Negative.   Neurological: Positive for numbness.    Social History  Substance Use Topics  . Smoking status: Former Smoker    Packs/day: 1.00    Years: 25.00    Types: Cigarettes    Quit date: 12/15/1995  . Smokeless tobacco: Never Used     Comment: quit in 1990's  . Alcohol use 0.0 oz/week     Comment: occasionally   Objective:   BP 140/70 (BP Location: Left Arm, Patient Position: Sitting, Cuff Size: Large)   Pulse 72   Temp 98.5 F (36.9 C) (Oral)   Resp 16   Ht 5\' 5"  (1.651 m)   Wt 205 lb 9.6 oz (93.3 kg)   BMI 34.21 kg/m  Vitals:   09/28/17 1107  BP: 140/70  Pulse: 72  Resp: 16  Temp: 98.5 F (36.9 C)  TempSrc: Oral  Weight: 205 lb 9.6 oz (93.3 kg)  Height: 5\' 5"  (1.651 m)     Physical Exam  Constitutional: She appears well-developed and well-nourished. No distress.  Neck: Normal range of motion. Neck supple. No JVD  present. No tracheal deviation present. No thyromegaly present.  Cardiovascular: Normal rate, regular rhythm and normal heart sounds.  Exam reveals no gallop and no friction rub.   No murmur heard. Pulmonary/Chest: Effort normal and breath sounds normal. No respiratory distress. She has no wheezes. She has no rales.  Musculoskeletal: Normal range of motion. She exhibits no edema.  Lymphadenopathy:    She has no cervical adenopathy.  Skin: She is not diaphoretic.  Vitals reviewed.  Diabetic Foot Exam - Simple   Simple Foot Form Diabetic Foot exam was performed with the following findings:  Yes 09/28/2017 11:48 AM  Visual Inspection No deformities, no ulcerations, no other skin breakdown bilaterally:  Yes Sensation Testing Intact to touch and monofilament testing bilaterally:  Yes Pulse Check Posterior Tibialis and Dorsalis pulse intact bilaterally:  Yes Comments       Assessment & Plan:     1. Type 2 diabetes mellitus without complication, without long-term current use of insulin (HCC) A1c improved to 5.8. Microalbumin 20. Will continue  metformin 500mg  once daily as below. I will see her back in 6 months for AWV.  - POCT UA - Microalbumin - POCT glycosylated hemoglobin (Hb A1C) - metFORMIN (GLUCOPHAGE) 500 MG tablet; Take 1 tablet (500 mg total) by mouth daily with breakfast.  Dispense: 90 tablet; Refill: 1  2. Essential hypertension Stable. Continue Losartan-HCTZ 50-12.5mg .   3. Hypercholesteremia Stable. Continue pravastatin 10mg .   4. Primary insomnia Stable. Diagnosis pulled for medication refill. Continue current medical treatment plan. - ALPRAZolam (XANAX) 0.5 MG tablet; Take 0.5-1 tablets (0.25-0.5 mg total) by mouth at bedtime as needed.  Dispense: 90 tablet; Refill: 1  5. Hypothyroidism, unspecified type Stable. Continue Levothyroxine 15mcg daily.  - TSH  6. Need for influenza vaccination Flu vaccine given today without complication. Patient sat upright for 15  minutes to check for adverse reaction before being released. - Flu vaccine HIGH DOSE PF       Mar Daring, PA-C  [facility]

## 2017-09-29 ENCOUNTER — Telehealth: Payer: Self-pay

## 2017-09-29 MED ORDER — LEVOTHYROXINE SODIUM 100 MCG PO TABS: 100.0000 ug | ORAL_TABLET | Freq: Every day | ORAL | 3 refills | Status: DC

## 2017-09-29 NOTE — Addendum Note (Signed)
Addended by: Mar Daring on: 09/29/2017 08:49 AM   Modules accepted: Orders

## 2017-09-29 NOTE — Telephone Encounter (Signed)
-----   Message from Mar Daring, PA-C sent at 09/29/2017  8:49 AM EDT ----- TSH now WNL. Will send in refill for 120mcg.

## 2017-09-29 NOTE — Telephone Encounter (Signed)
LM calling regardin labs results. Will try to call patient later.  Thanks,  -Joseline

## 2017-10-02 NOTE — Telephone Encounter (Signed)
Patient advised as directed below.  Thanks,  -Oluwafemi Villella 

## 2017-10-05 DIAGNOSIS — H2511 Age-related nuclear cataract, right eye: Secondary | ICD-10-CM | POA: Diagnosis not present

## 2017-11-02 ENCOUNTER — Ambulatory Visit: Payer: Self-pay | Admitting: Physician Assistant

## 2017-11-02 DIAGNOSIS — H2512 Age-related nuclear cataract, left eye: Secondary | ICD-10-CM | POA: Diagnosis not present

## 2017-11-10 ENCOUNTER — Encounter: Payer: Self-pay | Admitting: *Deleted

## 2017-11-17 ENCOUNTER — Ambulatory Visit: Payer: Medicare Other | Admitting: Anesthesiology

## 2017-11-17 ENCOUNTER — Encounter: Payer: Self-pay | Admitting: *Deleted

## 2017-11-17 ENCOUNTER — Ambulatory Visit
Admission: RE | Admit: 2017-11-17 | Discharge: 2017-11-17 | Disposition: A | Discharge status: Home / Self Care | Payer: Medicare Other | Source: Ambulatory Visit | Attending: Ophthalmology | Admitting: Ophthalmology

## 2017-11-17 ENCOUNTER — Other Ambulatory Visit: Payer: Self-pay

## 2017-11-17 ENCOUNTER — Encounter
Admission: RE | Disposition: A | Discharge status: Home / Self Care | Payer: Self-pay | Source: Ambulatory Visit | Attending: Ophthalmology

## 2017-11-17 DIAGNOSIS — E7800 Pure hypercholesterolemia, unspecified: Secondary | ICD-10-CM | POA: Insufficient documentation

## 2017-11-17 DIAGNOSIS — Z880 Allergy status to penicillin: Secondary | ICD-10-CM | POA: Insufficient documentation

## 2017-11-17 DIAGNOSIS — H2512 Age-related nuclear cataract, left eye: Secondary | ICD-10-CM | POA: Diagnosis not present

## 2017-11-17 DIAGNOSIS — E039 Hypothyroidism, unspecified: Secondary | ICD-10-CM | POA: Insufficient documentation

## 2017-11-17 DIAGNOSIS — Z96651 Presence of right artificial knee joint: Secondary | ICD-10-CM | POA: Insufficient documentation

## 2017-11-17 DIAGNOSIS — Z87891 Personal history of nicotine dependence: Secondary | ICD-10-CM | POA: Insufficient documentation

## 2017-11-17 DIAGNOSIS — E1151 Type 2 diabetes mellitus with diabetic peripheral angiopathy without gangrene: Secondary | ICD-10-CM | POA: Diagnosis not present

## 2017-11-17 DIAGNOSIS — Z85828 Personal history of other malignant neoplasm of skin: Secondary | ICD-10-CM | POA: Insufficient documentation

## 2017-11-17 DIAGNOSIS — I10 Essential (primary) hypertension: Secondary | ICD-10-CM | POA: Diagnosis not present

## 2017-11-17 DIAGNOSIS — Z79899 Other long term (current) drug therapy: Secondary | ICD-10-CM | POA: Diagnosis not present

## 2017-11-17 DIAGNOSIS — Z7984 Long term (current) use of oral hypoglycemic drugs: Secondary | ICD-10-CM | POA: Diagnosis not present

## 2017-11-17 DIAGNOSIS — Z7982 Long term (current) use of aspirin: Secondary | ICD-10-CM | POA: Diagnosis not present

## 2017-11-17 HISTORY — DX: Dizziness and giddiness: R42

## 2017-11-17 HISTORY — DX: Other disorders of electrolyte and fluid balance, not elsewhere classified: E87.8

## 2017-11-17 HISTORY — DX: Anxiety disorder, unspecified: F41.9

## 2017-11-17 HISTORY — DX: Malignant (primary) neoplasm, unspecified: C80.1

## 2017-11-17 HISTORY — PX: CATARACT EXTRACTION W/PHACO: SHX586

## 2017-11-17 LAB — GLUCOSE, CAPILLARY: GLUCOSE-CAPILLARY: 110 mg/dL — AB (ref 65–99)

## 2017-11-17 SURGERY — PHACOEMULSIFICATION, CATARACT, WITH IOL INSERTION
Anesthesia: Monitor Anesthesia Care | Site: Eye | Laterality: Left | Wound class: Clean

## 2017-11-17 MED ORDER — MOXIFLOXACIN HCL 0.5 % OP SOLN
OPHTHALMIC | Status: DC | PRN
  Administered 2017-11-17: .2 mL via OPHTHALMIC

## 2017-11-17 MED ORDER — MOXIFLOXACIN HCL 0.5 % OP SOLN: 1.0000 [drp] | OPHTHALMIC | Status: DC | PRN

## 2017-11-17 MED ORDER — SODIUM CHLORIDE 0.9 % IV SOLN
INTRAVENOUS | Status: DC
  Administered 2017-11-17: 07:00:00 via INTRAVENOUS

## 2017-11-17 MED ORDER — BSS IO SOLN
INTRAOCULAR | Status: DC | PRN
  Administered 2017-11-17: 1 mL via OPHTHALMIC

## 2017-11-17 MED ORDER — NA CHONDROIT SULF-NA HYALURON 40-17 MG/ML IO SOLN
INTRAOCULAR | Status: DC | PRN
  Administered 2017-11-17: 1 mL via INTRAOCULAR

## 2017-11-17 MED ORDER — LIDOCAINE HCL (PF) 4 % IJ SOLN
INTRAOCULAR | Status: DC | PRN
  Administered 2017-11-17: 2 mL via OPHTHALMIC

## 2017-11-17 MED ORDER — MOXIFLOXACIN HCL 0.5 % OP SOLN
OPHTHALMIC | Status: AC
  Filled 2017-11-17: qty 3

## 2017-11-17 MED ORDER — CARBACHOL 0.01 % IO SOLN
INTRAOCULAR | Status: DC | PRN
  Administered 2017-11-17: .5 mL via INTRAOCULAR

## 2017-11-17 MED ORDER — FENTANYL CITRATE (PF) 100 MCG/2ML IJ SOLN
INTRAMUSCULAR | Status: AC
  Filled 2017-11-17: qty 2

## 2017-11-17 MED ORDER — EPINEPHRINE PF 1 MG/ML IJ SOLN
INTRAMUSCULAR | Status: AC
  Filled 2017-11-17: qty 1

## 2017-11-17 MED ORDER — FENTANYL CITRATE (PF) 100 MCG/2ML IJ SOLN
INTRAMUSCULAR | Status: DC | PRN
  Administered 2017-11-17 (×2): 25 ug via INTRAVENOUS

## 2017-11-17 MED ORDER — LIDOCAINE HCL (PF) 4 % IJ SOLN
INTRAMUSCULAR | Status: AC
  Filled 2017-11-17: qty 5

## 2017-11-17 MED ORDER — ARMC OPHTHALMIC DILATING DROPS
OPHTHALMIC | Status: AC
  Administered 2017-11-17: 1 via OPHTHALMIC
  Filled 2017-11-17: qty 0.4

## 2017-11-17 MED ORDER — POVIDONE-IODINE 5 % OP SOLN
OPHTHALMIC | Status: DC | PRN
  Administered 2017-11-17: 1 via OPHTHALMIC

## 2017-11-17 MED ORDER — MIDAZOLAM HCL 2 MG/2ML IJ SOLN
INTRAMUSCULAR | Status: DC | PRN
  Administered 2017-11-17 (×2): 1 mg via INTRAVENOUS

## 2017-11-17 MED ORDER — MIDAZOLAM HCL 2 MG/2ML IJ SOLN
INTRAMUSCULAR | Status: AC
  Filled 2017-11-17: qty 2

## 2017-11-17 MED ORDER — POVIDONE-IODINE 5 % OP SOLN
OPHTHALMIC | Status: AC
  Filled 2017-11-17: qty 30

## 2017-11-17 MED ORDER — ARMC OPHTHALMIC DILATING DROPS
1.0000 "application " | OPHTHALMIC | Status: AC
  Administered 2017-11-17 (×3): 1 via OPHTHALMIC

## 2017-11-17 MED ORDER — NA CHONDROIT SULF-NA HYALURON 40-17 MG/ML IO SOLN
INTRAOCULAR | Status: AC
  Filled 2017-11-17: qty 1

## 2017-11-17 SURGICAL SUPPLY — 18 items
GLOVE BIO SURGEON STRL SZ8 (GLOVE) ×2 IMPLANT
GLOVE BIOGEL M 6.5 STRL (GLOVE) ×2 IMPLANT
GLOVE SURG LX 8.0 MICRO (GLOVE) ×1
GLOVE SURG LX STRL 8.0 MICRO (GLOVE) ×1 IMPLANT
GOWN STRL REUS W/ TWL LRG LVL3 (GOWN DISPOSABLE) ×2 IMPLANT
GOWN STRL REUS W/TWL LRG LVL3 (GOWN DISPOSABLE) ×2
LABEL CATARACT MEDS ST (LABEL) ×2 IMPLANT
LENS IOL ACRSF IQ TRC 3 25.0 ×1 IMPLANT
LENS IOL ACRYSOF IQ TORIC 25.0 ×1 IMPLANT
LENS IOL IQ TORIC 3 25.0 ×1 IMPLANT
PACK CATARACT (MISCELLANEOUS) ×2 IMPLANT
PACK CATARACT BRASINGTON LX (MISCELLANEOUS) ×2 IMPLANT
PACK EYE AFTER SURG (MISCELLANEOUS) ×2 IMPLANT
SOL BSS BAG (MISCELLANEOUS) ×2
SOLUTION BSS BAG (MISCELLANEOUS) ×1 IMPLANT
SYR 5ML LL (SYRINGE) ×2 IMPLANT
WATER STERILE IRR 250ML POUR (IV SOLUTION) ×2 IMPLANT
WIPE NON LINTING 3.25X3.25 (MISCELLANEOUS) ×2 IMPLANT

## 2017-11-17 NOTE — Discharge Instructions (Signed)
Eye Surgery Discharge Instructions  Expect mild scratchy sensation or mild soreness. DO NOT RUB YOUR EYE!  The day of surgery:  Minimal physical activity, but bed rest is not required  No reading, computer work, or close hand work  No bending, lifting, or straining.  May watch TV  For 24 hours:  No driving, legal decisions, or alcoholic beverages  Safety precautions  Eat anything you prefer: It is better to start with liquids, then soup then solid foods.  _____ Eye patch should be worn until postoperative exam tomorrow.  ____ Solar shield eyeglasses should be worn for comfort in the sunlight/patch while sleeping  Resume all regular medications including aspirin or Coumadin if these were discontinued prior to surgery. You may shower, bathe, shave, or wash your hair. Tylenol may be taken for mild discomfort.  Call your doctor if you experience significant pain, nausea, or vomiting, fever > 101 or other signs of infection. [phone] or [phone] Specific instructions:  Follow-up Information    Birder Robson, MD Follow up on 11/18/2017.   Specialty:  Ophthalmology Why:  8:45 Contact information: [address] [place] [address] [phone]

## 2017-11-17 NOTE — Anesthesia Post-op Follow-up Note (Signed)
Anesthesia QCDR form completed.        

## 2017-11-17 NOTE — H&P (Signed)
All labs reviewed. Abnormal studies sent to patients PCP when indicated.  Previous H&P reviewed, patient examined, there are NO CHANGES.  Wendy Barnes LOUIS12/4/20187:17 AM

## 2017-11-17 NOTE — Transfer of Care (Signed)
Immediate Anesthesia Transfer of Care Note  Patient: Wendy Barnes  Procedure(s) Performed: CATARACT EXTRACTION PHACO AND INTRAOCULAR LENS PLACEMENT (IOC)-LEFT DIABETIC (Left Eye)  Patient Location: PACU  Anesthesia Type:MAC  Level of Consciousness: awake and alert   Airway & Oxygen Therapy: Patient Spontanous Breathing  Post-op Assessment: Report given to RN and Post -op Vital signs reviewed and stable  Post vital signs: Reviewed and stable  Last Vitals:  Vitals:   11/10/17 1050 11/17/17 0627  BP: (!) 143/69 (!) 150/66  Pulse: (!) 59 66  Resp:  18  Temp:  37 C  SpO2:  98%    Last Pain:  Vitals:   11/17/17 0627  TempSrc: Oral         Complications: No apparent anesthesia complications

## 2017-11-17 NOTE — Anesthesia Preprocedure Evaluation (Signed)
Anesthesia Evaluation  Patient identified by MRN, date of birth, ID band Patient awake    Reviewed: Allergy & Precautions, H&P , NPO status , Patient's Chart, lab work & pertinent test results, reviewed documented beta blocker date and time   History of Anesthesia Complications Negative for: history of anesthetic complications  Airway Mallampati: I  TM Distance: >3 FB Neck ROM: full    Dental no notable dental hx. (+) Edentulous Upper, Upper Dentures   Pulmonary neg pulmonary ROS, former smoker,    Pulmonary exam normal breath sounds clear to auscultation       Cardiovascular Exercise Tolerance: Good hypertension, On Medications (-) angina+ Peripheral Vascular Disease  (-) CAD, (-) Past MI, (-) Cardiac Stents and (-) CABG Normal cardiovascular exam(-) dysrhythmias (-) Valvular Problems/Murmurs Rhythm:regular Rate:Normal     Neuro/Psych negative neurological ROS  negative psych ROS   GI/Hepatic negative GI ROS, Neg liver ROS,   Endo/Other  diabetesHypothyroidism   Renal/GU negative Renal ROS  negative genitourinary   Musculoskeletal   Abdominal   Peds  Hematology negative hematology ROS (+)   Anesthesia Other Findings Past Medical History:   Hypertension                                                 Thyroid disease                                              Hypothyroidism                                               Varicella                                                    Reproductive/Obstetrics negative OB ROS                             Anesthesia Physical  Anesthesia Plan  ASA: II  Anesthesia Plan: MAC   Post-op Pain Management:    Induction:   PONV Risk Score and Plan:   Airway Management Planned:   Additional Equipment:   Intra-op Plan:   Post-operative Plan:   Informed Consent: I have reviewed the patients History and Physical, chart, labs and discussed  the procedure including the risks, benefits and alternatives for the proposed anesthesia with the patient or authorized representative who has indicated his/her understanding and acceptance.   Dental Advisory Given  Plan Discussed with: Anesthesiologist, CRNA and Surgeon  Anesthesia Plan Comments:         Anesthesia Quick Evaluation

## 2017-11-17 NOTE — OR Nursing (Signed)
IV in left hand removed before dc home.  No redness or bruising

## 2017-11-17 NOTE — Anesthesia Procedure Notes (Signed)
Procedure Name: MAC Date/Time: 11/17/2017 7:30 AM Performed by: Johnna Acosta, CRNA Pre-anesthesia Checklist: Patient identified, Emergency Drugs available, Suction available, Patient being monitored and Timeout performed Patient Re-evaluated:Patient Re-evaluated prior to induction Oxygen Delivery Method: Nasal cannula

## 2017-11-17 NOTE — Op Note (Signed)
PREOPERATIVE DIAGNOSIS:  Nuclear sclerotic cataract of the left eye.   POSTOPERATIVE DIAGNOSIS:  Nuclear sclerotic cataract of the left eye.   OPERATIVE PROCEDURE: Procedure(s): CATARACT EXTRACTION PHACO AND INTRAOCULAR LENS PLACEMENT (IOC)-LEFT DIABETIC   SURGEON:  Birder Robson, MD.   ANESTHESIA: 1.      Managed anesthesia care. 2.     0.25ml os Shugarcaine was instilled following the paracentesis [email or web address]@   COMPLICATIONS:  None.   TECHNIQUE:   Stop and chop    DESCRIPTION OF PROCEDURE:  The patient was examined and consented in the preoperative holding area where the aforementioned topical anesthesia was applied to the left eye.  The patient was brought back to the Operating Room where he was sat upright on the gurney and given a target to fixate upon while the eye was marked at the 3:00 and 9:00 position.  The patient was then reclined on the operating table.  The eye was prepped and draped in the usual sterile ophthalmic fashion and a lid speculum was placed. A paracentesis was created with the side port blade and the anterior chamber was filled with viscoelastic. A near clear corneal incision was performed with the steel keratome. A continuous curvilinear capsulorrhexis was performed with a cystotome followed by the capsulorrhexis forceps. Hydrodissection and hydrodelineation were carried out with BSS on a blunt cannula. The lens was removed in a stop and chop technique and the remaining cortical material was removed with the irrigation-aspiration handpiece. The eye was inflated with viscoelastic and the ZCT lens was placed in the eye and rotated to within a few degrees of the predetermined orientation.  The remaining viscoelastic was removed from the eye.  The Sinskey hook was used to rotate the toric lens into its final resting place at 145 degrees.  0.1 ml of Vigamox was placed in the anterior chamber. The eye was inflated to a physiologic pressure and found to be watertight.  The  eye was dressed with Vigamox. The patient was given protective glasses to wear throughout the day and a shield with which to sleep tonight. The patient was also given drops with which to begin a drop regimen today and will follow-up with me in one day. Implant Name Type Inv. Item Serial No. Manufacturer Lot No. LRB No. Used  LENS IOL TORIC 25.0 - [id number]  LENS IOL TORIC 25.0 [id number] ALCON  Left 1   Procedure(s) with comments: CATARACT EXTRACTION PHACO AND INTRAOCULAR LENS PLACEMENT (IOC)-LEFT DIABETIC (Left) - [place] 00:36 AP% 15.1 CDE 5.46 Fluid pack lot # [id number]  Electronically signed: Jillianna Stanek LOUIS 12/4/20187:54 AM

## 2017-11-17 NOTE — Anesthesia Postprocedure Evaluation (Signed)
Anesthesia Post Note  Patient: Equities trader  Procedure(s) Performed: CATARACT EXTRACTION PHACO AND INTRAOCULAR LENS PLACEMENT (IOC)-LEFT DIABETIC (Left Eye)  Patient location during evaluation: PACU Anesthesia Type: MAC Level of consciousness: awake and alert and oriented Pain management: pain level controlled Vital Signs Assessment: post-procedure vital signs reviewed and stable Respiratory status: spontaneous breathing Cardiovascular status: blood pressure returned to baseline Anesthetic complications: no     Last Vitals:  Vitals:   11/17/17 0756 11/17/17 0812  BP: 136/62 135/66  Pulse: 65 (!) 58  Resp: 16 16  Temp: 36.8 C   SpO2: 98% 98%    Last Pain:  Vitals:   11/17/17 0756  TempSrc: Oral                 Bodi Palmeri

## 2017-11-22 IMAGING — MG MM DIGITAL SCREENING BILAT W/ TOMO W/ CAD
9 of 12 series · 9 of 28 positions shown · non-contrast
Comparison: Previous exam(s).

CLINICAL DATA: Screening.

EXAM:
2D DIGITAL SCREENING BILATERAL MAMMOGRAM WITH CAD AND ADJUNCT TOMO

[L MLO]
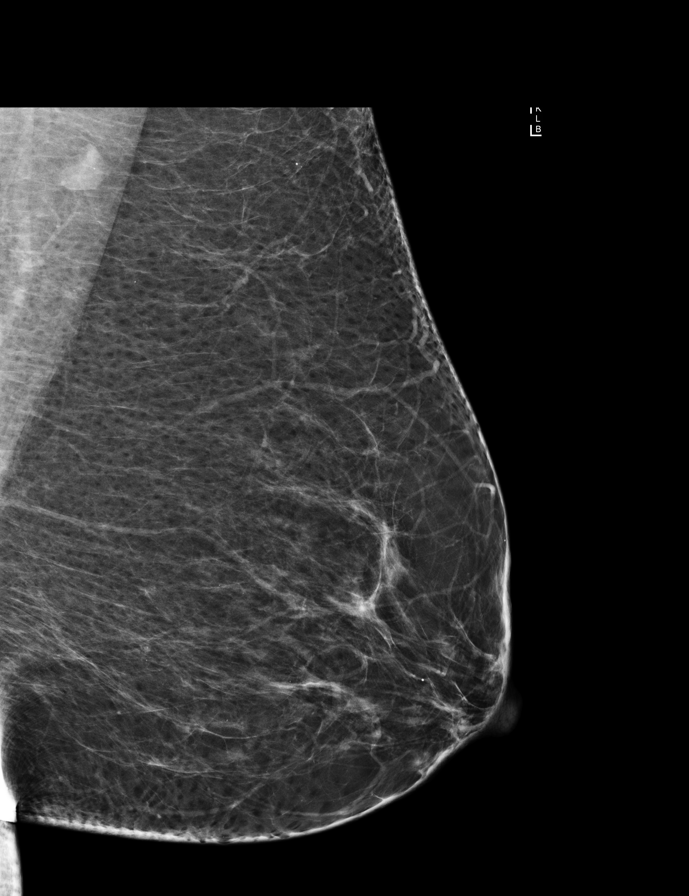

[R CC]
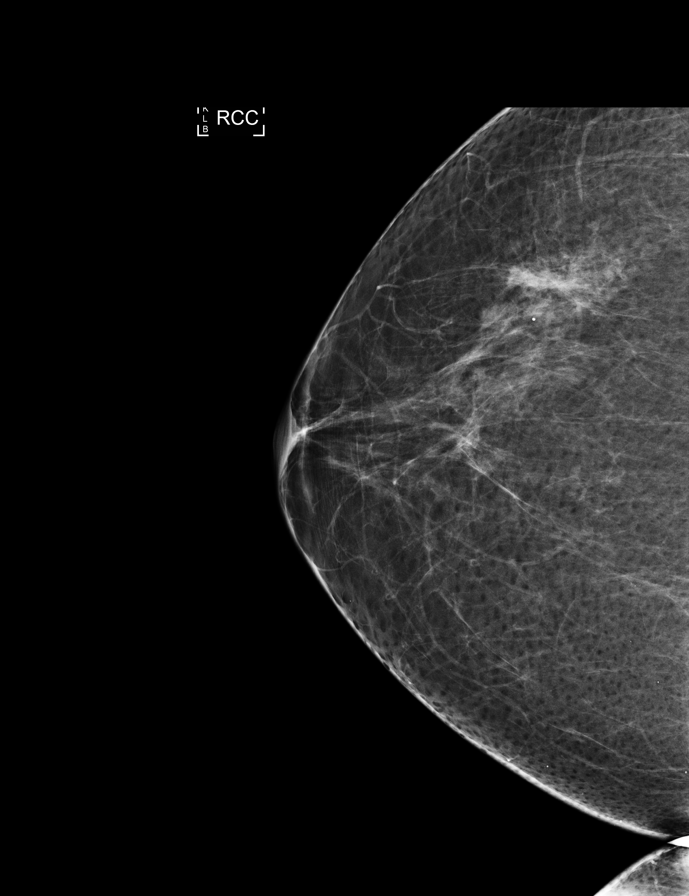

[L CC synth-2D]
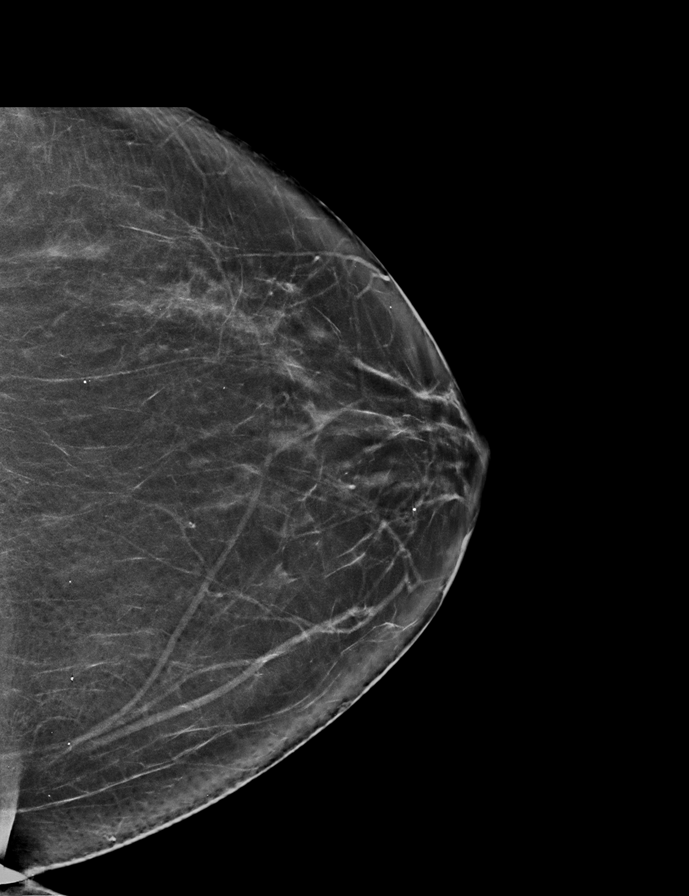

[R CC synth-2D]
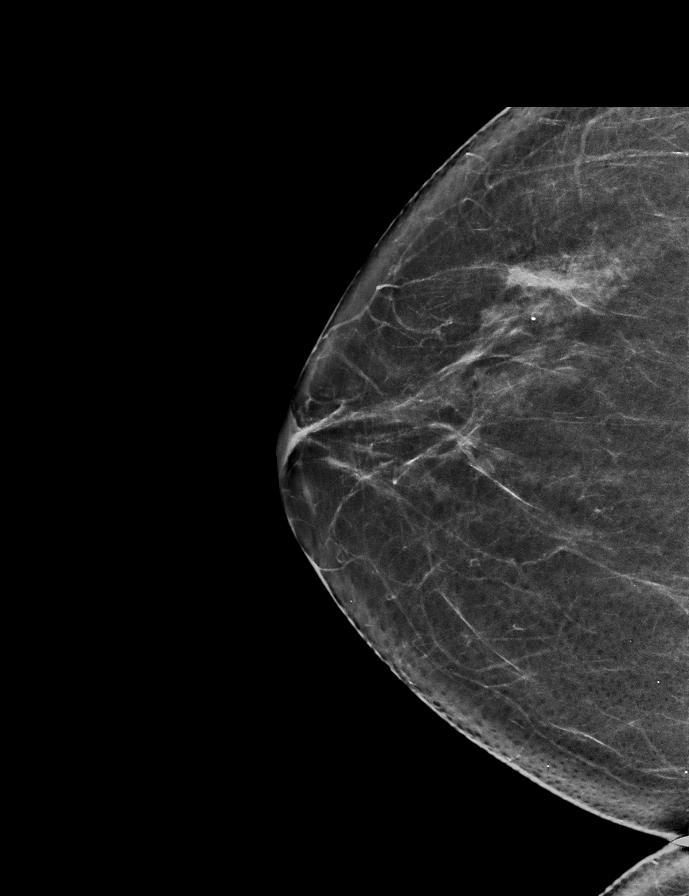

[L MLO synth-2D]
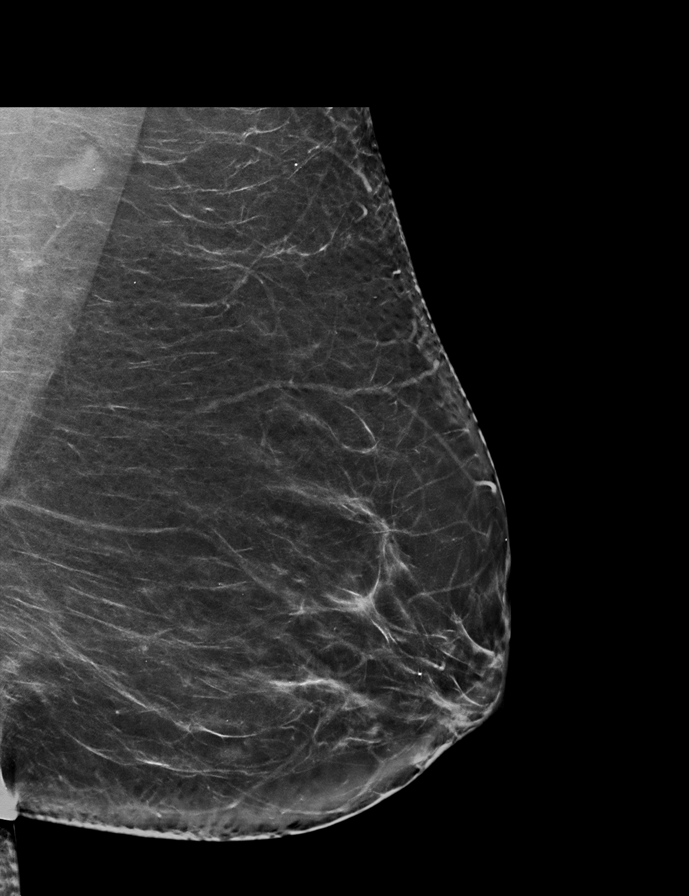

[L CC]
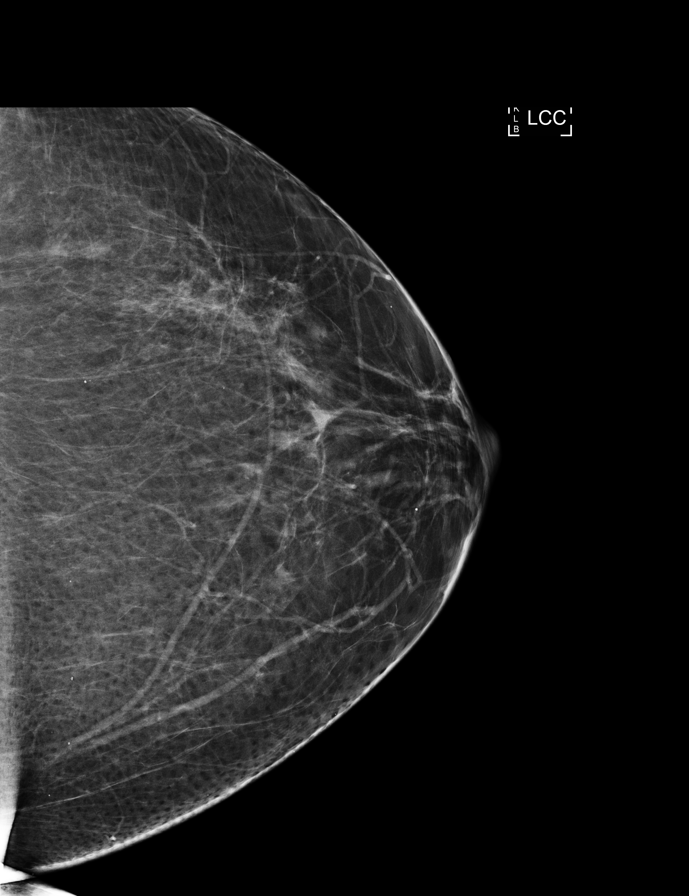

[R MLO]
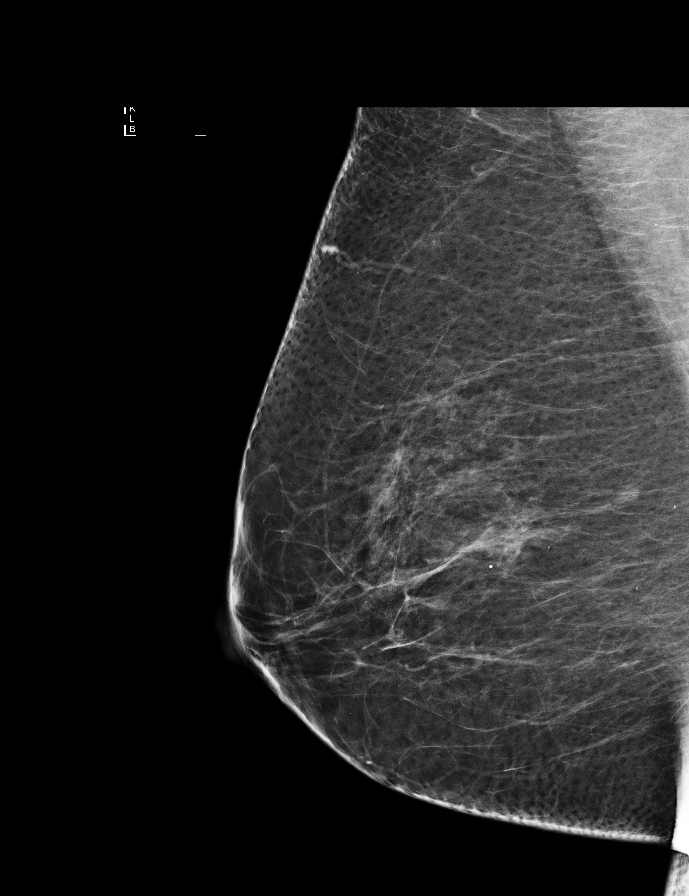

[R MLO synth-2D]
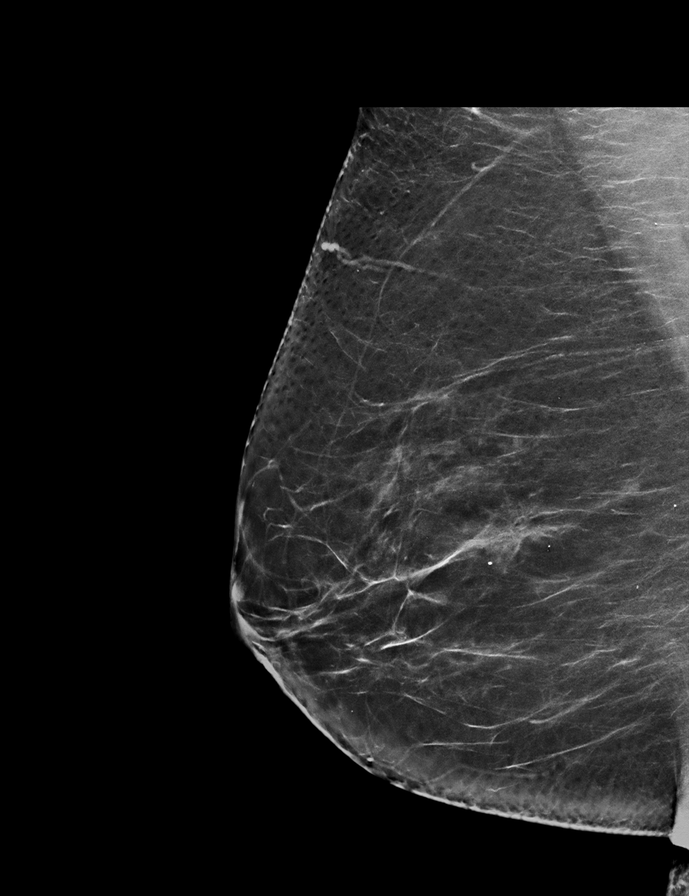

[L CC tomo · tomo slice 35/68.0]
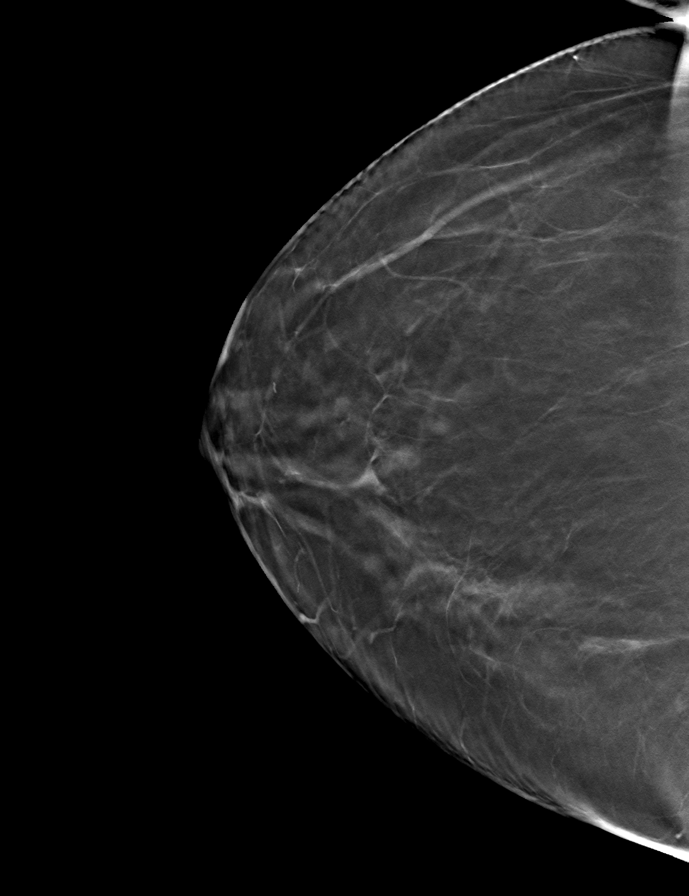

[9 of 28 positions shown; findings below may reference images not displayed]

ACR Breast Density Category b: There are scattered areas of
fibroglandular density.
FINDINGS: There are no findings suspicious for malignancy. Images were
processed with CAD.
IMPRESSION: No mammographic evidence of malignancy. A result letter of this
screening mammogram will be mailed directly to the patient.

RECOMMENDATION:
Screening mammogram in one year. (Code:97-6-RS4)

BI-RADS CATEGORY  1: Negative.

## 2017-11-25 DIAGNOSIS — H2511 Age-related nuclear cataract, right eye: Secondary | ICD-10-CM | POA: Diagnosis not present

## 2017-12-14 ENCOUNTER — Encounter: Payer: Self-pay | Admitting: *Deleted

## 2017-12-22 ENCOUNTER — Ambulatory Visit: Payer: Medicare Other | Admitting: Certified Registered Nurse Anesthetist

## 2017-12-22 ENCOUNTER — Encounter: Payer: Self-pay | Admitting: Certified Registered Nurse Anesthetist

## 2017-12-22 ENCOUNTER — Encounter
Admission: RE | Disposition: A | Discharge status: Home / Self Care | Payer: Self-pay | Source: Ambulatory Visit | Attending: Ophthalmology

## 2017-12-22 ENCOUNTER — Ambulatory Visit
Admission: RE | Admit: 2017-12-22 | Discharge: 2017-12-22 | Disposition: A | Discharge status: Home / Self Care | Payer: Medicare Other | Source: Ambulatory Visit | Attending: Ophthalmology | Admitting: Ophthalmology

## 2017-12-22 DIAGNOSIS — I10 Essential (primary) hypertension: Secondary | ICD-10-CM | POA: Insufficient documentation

## 2017-12-22 DIAGNOSIS — H2511 Age-related nuclear cataract, right eye: Secondary | ICD-10-CM | POA: Diagnosis not present

## 2017-12-22 DIAGNOSIS — E1151 Type 2 diabetes mellitus with diabetic peripheral angiopathy without gangrene: Secondary | ICD-10-CM | POA: Diagnosis not present

## 2017-12-22 DIAGNOSIS — Z85828 Personal history of other malignant neoplasm of skin: Secondary | ICD-10-CM | POA: Insufficient documentation

## 2017-12-22 DIAGNOSIS — Z7982 Long term (current) use of aspirin: Secondary | ICD-10-CM | POA: Insufficient documentation

## 2017-12-22 DIAGNOSIS — Z87891 Personal history of nicotine dependence: Secondary | ICD-10-CM | POA: Diagnosis not present

## 2017-12-22 DIAGNOSIS — Z96651 Presence of right artificial knee joint: Secondary | ICD-10-CM | POA: Insufficient documentation

## 2017-12-22 DIAGNOSIS — Z7984 Long term (current) use of oral hypoglycemic drugs: Secondary | ICD-10-CM | POA: Diagnosis not present

## 2017-12-22 DIAGNOSIS — Z79899 Other long term (current) drug therapy: Secondary | ICD-10-CM | POA: Insufficient documentation

## 2017-12-22 DIAGNOSIS — F419 Anxiety disorder, unspecified: Secondary | ICD-10-CM | POA: Diagnosis not present

## 2017-12-22 DIAGNOSIS — E7800 Pure hypercholesterolemia, unspecified: Secondary | ICD-10-CM | POA: Insufficient documentation

## 2017-12-22 DIAGNOSIS — E039 Hypothyroidism, unspecified: Secondary | ICD-10-CM | POA: Diagnosis not present

## 2017-12-22 HISTORY — PX: CATARACT EXTRACTION W/PHACO: SHX586

## 2017-12-22 LAB — GLUCOSE, CAPILLARY: Glucose-Capillary: 110 mg/dL — ABNORMAL HIGH (ref 65–99)

## 2017-12-22 SURGERY — PHACOEMULSIFICATION, CATARACT, WITH IOL INSERTION
Anesthesia: Monitor Anesthesia Care | Site: Eye | Laterality: Right | Wound class: Clean

## 2017-12-22 MED ORDER — MIDAZOLAM HCL 2 MG/2ML IJ SOLN
INTRAMUSCULAR | Status: DC | PRN
  Administered 2017-12-22: 1 mg via INTRAVENOUS
  Administered 2017-12-22 (×2): 0.5 mg via INTRAVENOUS

## 2017-12-22 MED ORDER — LIDOCAINE HCL (PF) 4 % IJ SOLN
INTRAMUSCULAR | Status: AC
  Filled 2017-12-22: qty 5

## 2017-12-22 MED ORDER — NA CHONDROIT SULF-NA HYALURON 40-17 MG/ML IO SOLN
INTRAOCULAR | Status: DC | PRN
  Administered 2017-12-22: 1 mL via INTRAOCULAR

## 2017-12-22 MED ORDER — EPINEPHRINE PF 1 MG/ML IJ SOLN
INTRAMUSCULAR | Status: AC
  Filled 2017-12-22: qty 2

## 2017-12-22 MED ORDER — POVIDONE-IODINE 5 % OP SOLN
OPHTHALMIC | Status: DC | PRN
  Administered 2017-12-22: 1 via OPHTHALMIC

## 2017-12-22 MED ORDER — MOXIFLOXACIN HCL 0.5 % OP SOLN
OPHTHALMIC | Status: DC | PRN
  Administered 2017-12-22: 0.2 mL via OPHTHALMIC

## 2017-12-22 MED ORDER — ARMC OPHTHALMIC DILATING DROPS
1.0000 "application " | OPHTHALMIC | Status: AC
  Administered 2017-12-22 (×3): 1 via OPHTHALMIC

## 2017-12-22 MED ORDER — SODIUM CHLORIDE 0.9 % IV SOLN
INTRAVENOUS | Status: DC
  Administered 2017-12-22: 09:00:00 via INTRAVENOUS

## 2017-12-22 MED ORDER — LIDOCAINE HCL (PF) 4 % IJ SOLN
INTRAOCULAR | Status: DC | PRN
  Administered 2017-12-22: 4 mL via OPHTHALMIC

## 2017-12-22 MED ORDER — POVIDONE-IODINE 5 % OP SOLN
OPHTHALMIC | Status: AC
  Filled 2017-12-22: qty 30

## 2017-12-22 MED ORDER — MIDAZOLAM HCL 2 MG/2ML IJ SOLN
INTRAMUSCULAR | Status: AC
  Filled 2017-12-22: qty 2

## 2017-12-22 MED ORDER — ARMC OPHTHALMIC DILATING DROPS
OPHTHALMIC | Status: AC
  Administered 2017-12-22: 1 via OPHTHALMIC
  Filled 2017-12-22: qty 0.4

## 2017-12-22 MED ORDER — MOXIFLOXACIN HCL 0.5 % OP SOLN
OPHTHALMIC | Status: AC
  Filled 2017-12-22: qty 3

## 2017-12-22 MED ORDER — CARBACHOL 0.01 % IO SOLN
INTRAOCULAR | Status: DC | PRN
  Administered 2017-12-22: 0.5 mL via INTRAOCULAR

## 2017-12-22 MED ORDER — NA CHONDROIT SULF-NA HYALURON 40-17 MG/ML IO SOLN
INTRAOCULAR | Status: AC
  Filled 2017-12-22: qty 1

## 2017-12-22 MED ORDER — EPINEPHRINE PF 1 MG/ML IJ SOLN
INTRAOCULAR | Status: DC | PRN
  Administered 2017-12-22: 10:00:00 via OPHTHALMIC

## 2017-12-22 MED ORDER — MOXIFLOXACIN HCL 0.5 % OP SOLN: 1.0000 [drp] | OPHTHALMIC | Status: DC | PRN

## 2017-12-22 SURGICAL SUPPLY — 18 items
GLOVE BIO SURGEON STRL SZ8 (GLOVE) ×3 IMPLANT
GLOVE BIOGEL M 6.5 STRL (GLOVE) ×3 IMPLANT
GLOVE SURG LX 8.0 MICRO (GLOVE) ×2
GLOVE SURG LX STRL 8.0 MICRO (GLOVE) ×1 IMPLANT
GOWN STRL REUS W/ TWL LRG LVL3 (GOWN DISPOSABLE) ×2 IMPLANT
GOWN STRL REUS W/TWL LRG LVL3 (GOWN DISPOSABLE) ×4
LABEL CATARACT MEDS ST (LABEL) ×3 IMPLANT
LENS IOL ACRSF IQ TRC 6 25.0 ×1 IMPLANT
LENS IOL ACRYSOF IQ TORIC 25.0 ×2 IMPLANT
LENS IOL IQ TORIC 6 25.0 ×1 IMPLANT
PACK CATARACT (MISCELLANEOUS) ×3 IMPLANT
PACK CATARACT BRASINGTON LX (MISCELLANEOUS) ×3 IMPLANT
PACK EYE AFTER SURG (MISCELLANEOUS) ×3 IMPLANT
SOL BSS BAG (MISCELLANEOUS) ×3
SOLUTION BSS BAG (MISCELLANEOUS) ×1 IMPLANT
SYR 5ML LL (SYRINGE) ×3 IMPLANT
WATER STERILE IRR 250ML POUR (IV SOLUTION) ×3 IMPLANT
WIPE NON LINTING 3.25X3.25 (MISCELLANEOUS) ×3 IMPLANT

## 2017-12-22 NOTE — Anesthesia Preprocedure Evaluation (Signed)
Anesthesia Evaluation  Patient identified by MRN, date of birth, ID band Patient awake    Reviewed: Allergy & Precautions, H&P , NPO status , Patient's Chart, lab work & pertinent test results, reviewed documented beta blocker date and time   History of Anesthesia Complications Negative for: history of anesthetic complications  Airway Mallampati: I  TM Distance: >3 FB Neck ROM: full    Dental no notable dental hx. (+) Edentulous Upper, Upper Dentures   Pulmonary neg pulmonary ROS, former smoker,    Pulmonary exam normal breath sounds clear to auscultation       Cardiovascular Exercise Tolerance: Good hypertension, On Medications (-) angina+ Peripheral Vascular Disease  (-) CAD, (-) Past MI, (-) Cardiac Stents and (-) CABG Normal cardiovascular exam(-) dysrhythmias (-) Valvular Problems/Murmurs Rhythm:regular Rate:Normal     Neuro/Psych negative neurological ROS  negative psych ROS   GI/Hepatic negative GI ROS, Neg liver ROS,   Endo/Other  diabetesHypothyroidism   Renal/GU negative Renal ROS  negative genitourinary   Musculoskeletal   Abdominal   Peds  Hematology negative hematology ROS (+)   Anesthesia Other Findings Past Medical History:   Hypertension                                                 Thyroid disease                                              Hypothyroidism                                               Varicella                                                    Reproductive/Obstetrics negative OB ROS                             Anesthesia Physical  Anesthesia Plan  ASA: II  Anesthesia Plan: MAC   Post-op Pain Management:    Induction:   PONV Risk Score and Plan:   Airway Management Planned:   Additional Equipment:   Intra-op Plan:   Post-operative Plan:   Informed Consent: I have reviewed the patients History and Physical, chart, labs and discussed  the procedure including the risks, benefits and alternatives for the proposed anesthesia with the patient or authorized representative who has indicated his/her understanding and acceptance.   Dental Advisory Given  Plan Discussed with: Anesthesiologist, CRNA and Surgeon  Anesthesia Plan Comments:         Anesthesia Quick Evaluation  

## 2017-12-22 NOTE — Anesthesia Post-op Follow-up Note (Signed)
Anesthesia QCDR form completed.        

## 2017-12-22 NOTE — Op Note (Signed)
PREOPERATIVE DIAGNOSIS:  Nuclear sclerotic cataract of the right eye.   POSTOPERATIVE DIAGNOSIS:  Nuclear sclerotic cataract of the right eye.   OPERATIVE PROCEDURE: Procedure(s): CATARACT EXTRACTION PHACO AND INTRAOCULAR LENS PLACEMENT (IOC)   SURGEON:  Birder Robson, MD.   ANESTHESIA: 1.      Managed anesthesia care. 2.     0.58ml of Shugarcaine was instilled following the paracentesis  Anesthesiologist: Martha Clan, MD CRNA: Eben Burow, CRNA  COMPLICATIONS:  None.   TECHNIQUE:   Stop and chop    DESCRIPTION OF PROCEDURE:  The patient was examined and consented in the preoperative holding area where the aforementioned topical anesthesia was applied to the right eye.  The patient was brought back to the Operating Room where he was sat upright on the gurney and given a target to fixate upon while the eye was marked at the 3:00 and 9:00 position.  The patient was then reclined on the operating table.  The eye was prepped and draped in the usual sterile ophthalmic fashion and a lid speculum was placed. A paracentesis was created with the side port blade and the anterior chamber was filled with viscoelastic. A near clear corneal incision was performed with the steel keratome. A continuous curvilinear capsulorrhexis was performed with a cystotome followed by the capsulorrhexis forceps. Hydrodissection and hydrodelineation were carried out with BSS on a blunt cannula. The lens was removed in a stop and chop technique and the remaining cortical material was removed with the irrigation-aspiration handpiece. The eye was inflated with viscoelastic and the ZCT  lens  was placed in the eye and rotated to within a few degrees of the predetermined orientation.  The remaining viscoelastic was removed from the eye.  The Sinskey hook was used to rotate the toric lens into its final resting place at 036 degrees.  0. The eye was inflated to a physiologic pressure and found to be watertight. 0.49ml of  Vigamox was placed in the anterior chamber.  The eye was dressed with Vigamox. The patient was given protective glasses to wear throughout the day and a shield with which to sleep tonight. The patient was also given drops with which to begin a drop regimen today and will follow-up with me in one day. Implant Name Type Inv. Item Serial No. Manufacturer Lot No. LRB No. Used  LENS IOL TORIC 25.0 - [id number]  LENS IOL TORIC 25.0 [id number] ALCON  Right 1   Procedure(s) with comments: CATARACT EXTRACTION PHACO AND INTRAOCULAR LENS PLACEMENT (IOC) (Right) - [place] 00:46.1 AP% 12.9 CDE 5.97 Fluid pack lot # [id number]  Electronically signed: [place] 12/22/2017 10:26 AM

## 2017-12-22 NOTE — Discharge Instructions (Signed)
Eye Surgery Discharge Instructions  Expect mild scratchy sensation or mild soreness. DO NOT RUB YOUR EYE!  The day of surgery:  Minimal physical activity, but bed rest is not required  No reading, computer work, or close hand work  No bending, lifting, or straining.  May watch TV  For 24 hours:  No driving, legal decisions, or alcoholic beverages  Safety precautions  Eat anything you prefer: It is better to start with liquids, then soup then solid foods.  _____ Eye patch should be worn until postoperative exam tomorrow.  ____ Solar shield eyeglasses should be worn for comfort in the sunlight/patch while sleeping  Resume all regular medications including aspirin or Coumadin if these were discontinued prior to surgery. You may shower, bathe, shave, or wash your hair. Tylenol may be taken for mild discomfort.  Call your doctor if you experience significant pain, nausea, or vomiting, fever > 101 or other signs of infection. [phone] or [phone] Specific instructions:  Follow-up Information    Birder Robson, MD Follow up on 12/23/2017.   Specialty:  Ophthalmology Why:  Tomorrow 10:45 am. Contact information: [address] [place] [address] [phone]

## 2017-12-22 NOTE — Transfer of Care (Signed)
Immediate Anesthesia Transfer of Care Note  Patient: Wendy Barnes  Procedure(s) Performed: CATARACT EXTRACTION PHACO AND INTRAOCULAR LENS PLACEMENT (IOC) (Right Eye)  Patient Location: Short stay  Anesthesia Type:MAC  Level of Consciousness: awake, alert , oriented and patient cooperative  Airway & Oxygen Therapy: Patient Spontanous Breathing  Post-op Assessment: Report given to RN and Post -op Vital signs reviewed and stable  Post vital signs: Reviewed and stable  Last Vitals:  Vitals:   12/22/17 0832  BP: (!) 179/80  Pulse: 63  Resp: 17  Temp: (!) 36.1 C  SpO2: 100%    Last Pain:  Vitals:   12/22/17 0832  TempSrc: Temporal         Complications: No apparent anesthesia complications

## 2017-12-22 NOTE — H&P (Signed)
All labs reviewed. Abnormal studies sent to patients PCP when indicated.  Previous H&P reviewed, patient examined, there are NO CHANGES.  Wendy Barnes LOUIS1/8/20199:59 AM

## 2017-12-22 NOTE — Anesthesia Postprocedure Evaluation (Signed)
Anesthesia Post Note  Patient: Equities trader  Procedure(s) Performed: CATARACT EXTRACTION PHACO AND INTRAOCULAR LENS PLACEMENT ([place]) (Right Eye)  Patient location during evaluation: Short Stay Anesthesia Type: MAC Level of consciousness: awake and alert Pain management: pain level controlled Vital Signs Assessment: post-procedure vital signs reviewed and stable Respiratory status: spontaneous breathing and respiratory function stable Cardiovascular status: blood pressure returned to baseline Postop Assessment: no headache, no backache, no apparent nausea or vomiting and adequate PO intake Anesthetic complications: no     Last Vitals:  Vitals:   12/22/17 0832  BP: (!) 179/80  Pulse: 63  Resp: 17  Temp: (!) 36.1 C  SpO2: 100%    Last Pain:  Vitals:   12/22/17 0832  TempSrc: Temporal                 Eben Burow

## 2017-12-24 ENCOUNTER — Telehealth: Payer: Self-pay | Admitting: Physician Assistant

## 2017-12-24 DIAGNOSIS — E039 Hypothyroidism, unspecified: Secondary | ICD-10-CM

## 2017-12-24 MED ORDER — LEVOTHYROXINE SODIUM 100 MCG PO TABS: 100.0000 ug | ORAL_TABLET | Freq: Every day | ORAL | 3 refills | Status: DC

## 2017-12-24 NOTE — Telephone Encounter (Signed)
[facility] faxed refill request for the following medications:  levothyroxine (SYNTHROID, LEVOTHROID) 100 MCG tablet   90 day supply  On the request there was a hand written note stating "pt was taking the [employer] brand which is no longer available. She is now taking the [employer] brand. Please monitor labs accordingly."  Last Rx: 09/29/17 LOV: 09/28/17 Please advise. Thanks TNP

## 2017-12-24 NOTE — Telephone Encounter (Signed)
Please Review.  Thanks,  -Kyler Germer 

## 2017-12-24 NOTE — Telephone Encounter (Signed)
lmtcb

## 2017-12-24 NOTE — Telephone Encounter (Signed)
Patient advised as below. Patient verbalizes understanding and is in agreement with treatment plan.  

## 2017-12-24 NOTE — Telephone Encounter (Signed)
Please inform patient manufacturer of levothyroxine has changed and that she will need to have her thyroid lab rechecked in 4-6 weeks to make sure dose is still appropriate.

## 2018-01-12 ENCOUNTER — Telehealth: Payer: Self-pay | Admitting: Physician Assistant

## 2018-01-12 DIAGNOSIS — I10 Essential (primary) hypertension: Secondary | ICD-10-CM

## 2018-01-12 MED ORDER — BENAZEPRIL-HYDROCHLOROTHIAZIDE 20-12.5 MG PO TABS: 1.0000 | ORAL_TABLET | Freq: Every day | ORAL | 0 refills | Status: DC

## 2018-01-12 NOTE — Telephone Encounter (Signed)
Will send in benazepril-hctz. She will need appt in 4 weeks for BP check with new medications.

## 2018-01-12 NOTE — Telephone Encounter (Signed)
Patient advised as below. Patient verbalizes understanding and is in agreement with treatment plan. Patient will call back to schedule 4 wk fu for BP.

## 2018-01-12 NOTE — Telephone Encounter (Signed)
Pt stated that her pharmacist advised her that her losartan-hydrochlorothiazide (HYZAAR) 50-12.5 MG tablet lot # wasn't recalled but suggested that she discuss with Tawanna Sat about switching to Lisinopril or Benazepril. Pt request a call back to discuss changing the medication or if she needs an OV to discuss switching. [facility] Please advise. Thanks TNP

## 2018-01-12 NOTE — Telephone Encounter (Signed)
Please Review

## 2018-02-09 ENCOUNTER — Other Ambulatory Visit: Payer: Self-pay | Admitting: Physician Assistant

## 2018-02-09 DIAGNOSIS — I10 Essential (primary) hypertension: Secondary | ICD-10-CM

## 2018-02-11 ENCOUNTER — Encounter: Payer: Self-pay | Admitting: Physician Assistant

## 2018-02-11 ENCOUNTER — Ambulatory Visit (INDEPENDENT_AMBULATORY_CARE_PROVIDER_SITE_OTHER): Payer: Medicare Other | Admitting: Physician Assistant

## 2018-02-11 VITALS — BP 140/70 | HR 73 | Temp 98.3°F | Resp 16 | Wt 202.0 lb

## 2018-02-11 DIAGNOSIS — I10 Essential (primary) hypertension: Secondary | ICD-10-CM | POA: Diagnosis not present

## 2018-02-11 DIAGNOSIS — E039 Hypothyroidism, unspecified: Secondary | ICD-10-CM | POA: Diagnosis not present

## 2018-02-11 MED ORDER — BENAZEPRIL-HYDROCHLOROTHIAZIDE 20-12.5 MG PO TABS: ORAL_TABLET | ORAL | 1 refills | Status: DC

## 2018-02-11 NOTE — Progress Notes (Signed)
Patient: Wendy Barnes Female    DOB: 1941-08-10   77 y.o.   MRN: [id number] Visit Date: 02/11/2018  Today's Provider: Mar Daring, PA-C   Chief Complaint  Patient presents with  . Follow-up    BP and medication   Subjective:    HPI  Hypertension, follow-up:  BP Readings from Last 3 Encounters:  02/11/18 140/70  12/22/17 (!) 144/55  11/17/17 135/66    She was last seen for hypertension 1 months ago.  Management since that visit includes Benazepril -hctz was sent to the pharmacy for patient. She reports excellent compliance with treatment. She is not having side effects.  She is exercising, some-walking She is adherent to low salt diet.   Outside blood pressures are not checking. She is experiencing none.  Patient denies chest pain, chest pressure/discomfort, exertional chest pressure/discomfort, fatigue, irregular heart beat, lower extremity edema, near-syncope and palpitations.   Cardiovascular risk factors include advanced age (older than 7 for men, 17 for women), diabetes mellitus, dyslipidemia and hypertension.   Patient reports that she had her cataract surgery on both eyes. First one was Dec 2018 and second 12/2017. Reports "my eye are great"   Weight trend: stable Wt Readings from Last 3 Encounters:  02/11/18 202 lb (91.6 kg)  12/14/17 198 lb (89.8 kg)  11/10/17 198 lb (89.8 kg)    Current diet: in general, a "healthy" diet   ------------------------------------------------------------------------  Patients here to get her thyroid lab recheck also. Since patient was taking the [employer] brand which is no longer available. She is now taking the [employer] brand. Since the manufacturer of Levothyroxine changed she needs to recheck to make sure dose is still appropriate.    Allergies  Allergen Reactions  . Amoxicillin Hives and Rash    Has patient had a PCN reaction causing immediate rash, facial/tongue/throat swelling, SOB or lightheadedness with  hypotension: Yes Has patient had a PCN reaction causing severe rash involving mucus membranes or skin necrosis: Yes Has patient had a PCN reaction that required hospitalization: No Has patient had a PCN reaction occurring within the last 10 years: Yes If all of the above answers are "NO", then may proceed with Cephalosporin use.   [name] Penicillins Hives and Rash     Current Outpatient Medications:  .  ALPRAZolam (XANAX) 0.5 MG tablet, Take 0.5-1 tablets (0.25-0.5 mg total) by mouth at bedtime as needed. (Patient taking differently: Take 0.25 mg by mouth at bedtime as needed for sleep. ), Disp: 90 tablet, Rfl: 1 .  aspirin 81 MG tablet, Take 81 mg by mouth at bedtime. , Disp: , Rfl:  .  benazepril-hydrochlorthiazide (LOTENSIN HCT) 20-12.5 MG tablet, TAKE ONE TABLET BY MOUTH DAILY **REPLACING LOSARTAN/HCTZ**, Disp: 90 tablet, Rfl: 1 .  Biotin 2500 MCG CAPS, Take 2,500 mcg by mouth daily., Disp: , Rfl:  .  Calcium Carbonate-Vitamin D 600-200 MG-UNIT CAPS, Take 1 tablet by mouth daily with supper. , Disp: , Rfl:  .  ibuprofen (ADVIL,MOTRIN) 200 MG tablet, Take 400 mg by mouth daily as needed for headache or moderate pain., Disp: , Rfl:  .  levothyroxine (SYNTHROID, LEVOTHROID) 100 MCG tablet, Take 1 tablet (100 mcg total) by mouth daily before breakfast., Disp: 90 tablet, Rfl: 3 .  metFORMIN (GLUCOPHAGE) 500 MG tablet, Take 1 tablet (500 mg total) by mouth daily with breakfast., Disp: 90 tablet, Rfl: 1 .  MULTIPLE VITAMIN PO, Take 1 tablet by mouth daily. , Disp: , Rfl:  .  Omega-3 Fatty Acids (FISH OIL ULTRA) 1400 MG CAPS, Take 1,400 mg by mouth daily., Disp: , Rfl:  .  oxybutynin (DITROPAN) 5 MG tablet, Take 1 tablet (5 mg total) by mouth daily. (Patient taking differently: Take 5 mg by mouth daily with breakfast. ), Disp: 90 tablet, Rfl: 3 .  pravastatin (PRAVACHOL) 10 MG tablet, Take 1 tablet (10 mg total) by mouth daily. (Patient taking differently: Take 10 mg by mouth at bedtime. ), Disp: 90  tablet, Rfl: 3 .  ranitidine (ZANTAC) 150 MG tablet, Take 150 mg by mouth daily as needed for heartburn., Disp: , Rfl:   Review of Systems  Constitutional: Negative for fatigue.  Eyes: Negative for visual disturbance.  Respiratory: Negative for cough, chest tightness, shortness of breath and wheezing.   Cardiovascular: Negative for chest pain, palpitations and leg swelling.  Neurological: Negative for syncope, weakness, light-headedness, numbness and headaches.    Social History   Tobacco Use  . Smoking status: Former Smoker    Packs/day: 1.00    Years: 25.00    Pack years: 25.00    Types: Cigarettes    Last attempt to quit: 12/15/1995    Years since quitting: 22.1  . Smokeless tobacco: Never Used  . Tobacco comment: quit in 1990's  Substance Use Topics  . Alcohol use: Yes    Alcohol/week: 0.0 oz    Comment: occasionally   Objective:   BP 140/70 (BP Location: Left Arm, Patient Position: Sitting, Cuff Size: Large)   Pulse 73   Temp 98.3 F (36.8 C) (Oral)   Resp 16   Wt 202 lb (91.6 kg)   BMI 33.10 kg/m    Physical Exam  Constitutional: She appears well-developed and well-nourished. No distress.  Neck: Normal range of motion. Neck supple.  Cardiovascular: Normal rate, regular rhythm and normal heart sounds. Exam reveals no gallop and no friction rub.  No murmur heard. Pulmonary/Chest: Effort normal and breath sounds normal. No respiratory distress. She has no wheezes. She has no rales.  Skin: She is not diaphoretic.  Vitals reviewed.      Assessment & Plan:     1. Essential hypertension Stable. Diagnosis pulled for medication refill. Continue current medical treatment plan. Will check labs as below and f/u pending results. Patient has CPE scheduled 04/19/18. - CBC - Comprehensive Metabolic Panel (CMET) - benazepril-hydrochlorthiazide (LOTENSIN HCT) 20-12.5 MG tablet; TAKE ONE TABLET BY MOUTH DAILY **REPLACING LOSARTAN/HCTZ**  Dispense: 90 tablet; Refill: 1  2.  Hypothyroidism, unspecified type Will check labs as below and f/u pending results. Had manufacturer change so checking to make sure labs stable on current dose  levothyroxine 111mcg. - TSH       Mar Daring, PA-C  [facility]

## 2018-02-11 NOTE — Patient Instructions (Signed)
DASH Eating Plan DASH stands for "Dietary Approaches to Stop Hypertension." The DASH eating plan is a healthy eating plan that has been shown to reduce high blood pressure (hypertension). It may also reduce your risk for type 2 diabetes, heart disease, and stroke. The DASH eating plan may also help with weight loss. What are tips for following this plan? General guidelines  Avoid eating more than 2,300 mg (milligrams) of salt (sodium) a day. If you have hypertension, you may need to reduce your sodium intake to 1,500 mg a day.  Limit alcohol intake to no more than 1 drink a day for nonpregnant women and 2 drinks a day for men. One drink equals 12 oz of beer, 5 oz of wine, or 1 oz of hard liquor.  Work with your health care provider to maintain a healthy body weight or to lose weight. Ask what an ideal weight is for you.  Get at least 30 minutes of exercise that causes your heart to beat faster (aerobic exercise) most days of the week. Activities may include walking, swimming, or biking.  Work with your health care provider or diet and nutrition specialist (dietitian) to adjust your eating plan to your individual calorie needs. Reading food labels  Check food labels for the amount of sodium per serving. Choose foods with less than 5 percent of the Daily Value of sodium. Generally, foods with less than 300 mg of sodium per serving fit into this eating plan.  To find whole grains, look for the word "whole" as the first word in the ingredient list. Shopping  Buy products labeled as "low-sodium" or "no salt added."  Buy fresh foods. Avoid canned foods and premade or frozen meals. Cooking  Avoid adding salt when cooking. Use salt-free seasonings or herbs instead of table salt or sea salt. Check with your health care provider or pharmacist before using salt substitutes.  Do not fry foods. Cook foods using healthy methods such as baking, boiling, grilling, and broiling instead.  Cook with  heart-healthy oils, such as olive, canola, soybean, or sunflower oil. Meal planning   Eat a balanced diet that includes: ? 5 or more servings of fruits and vegetables each day. At each meal, try to fill half of your plate with fruits and vegetables. ? Up to 6-8 servings of whole grains each day. ? Less than 6 oz of lean meat, poultry, or fish each day. A 3-oz serving of meat is about the same size as a deck of cards. One egg equals 1 oz. ? 2 servings of low-fat dairy each day. ? A serving of nuts, seeds, or beans 5 times each week. ? Heart-healthy fats. Healthy fats called Omega-3 fatty acids are found in foods such as flaxseeds and coldwater fish, like sardines, salmon, and mackerel.  Limit how much you eat of the following: ? Canned or prepackaged foods. ? Food that is high in trans fat, such as fried foods. ? Food that is high in saturated fat, such as fatty meat. ? Sweets, desserts, sugary drinks, and other foods with added sugar. ? Full-fat dairy products.  Do not salt foods before eating.  Try to eat at least 2 vegetarian meals each week.  Eat more home-cooked food and less restaurant, buffet, and fast food.  When eating at a restaurant, ask that your food be prepared with less salt or no salt, if possible. What foods are recommended? The items listed may not be a complete list. Talk with your dietitian about what   dietary choices are best for you. Grains Whole-grain or whole-wheat bread. Whole-grain or whole-wheat pasta. Brown rice. Oatmeal. Quinoa. Bulgur. Whole-grain and low-sodium cereals. Pita bread. Low-fat, low-sodium crackers. Whole-wheat flour tortillas. Vegetables Fresh or frozen vegetables (raw, steamed, roasted, or grilled). Low-sodium or reduced-sodium tomato and vegetable juice. Low-sodium or reduced-sodium tomato sauce and tomato paste. Low-sodium or reduced-sodium canned vegetables. Fruits All fresh, dried, or frozen fruit. Canned fruit in natural juice (without  added sugar). Meat and other protein foods Skinless chicken or turkey. Ground chicken or turkey. Pork with fat trimmed off. Fish and seafood. Egg whites. Dried beans, peas, or lentils. Unsalted nuts, nut butters, and seeds. Unsalted canned beans. Lean cuts of beef with fat trimmed off. Low-sodium, lean deli meat. Dairy Low-fat (1%) or fat-free (skim) milk. Fat-free, low-fat, or reduced-fat cheeses. Nonfat, low-sodium ricotta or cottage cheese. Low-fat or nonfat yogurt. Low-fat, low-sodium cheese. Fats and oils Soft margarine without trans fats. Vegetable oil. Low-fat, reduced-fat, or light mayonnaise and salad dressings (reduced-sodium). Canola, safflower, olive, soybean, and sunflower oils. Avocado. Seasoning and other foods Herbs. Spices. Seasoning mixes without salt. Unsalted popcorn and pretzels. Fat-free sweets. What foods are not recommended? The items listed may not be a complete list. Talk with your dietitian about what dietary choices are best for you. Grains Baked goods made with fat, such as croissants, muffins, or some breads. Dry pasta or rice meal packs. Vegetables Creamed or fried vegetables. Vegetables in a cheese sauce. Regular canned vegetables (not low-sodium or reduced-sodium). Regular canned tomato sauce and paste (not low-sodium or reduced-sodium). Regular tomato and vegetable juice (not low-sodium or reduced-sodium). Pickles. Olives. Fruits Canned fruit in a light or heavy syrup. Fried fruit. Fruit in cream or butter sauce. Meat and other protein foods Fatty cuts of meat. Ribs. Fried meat. Bacon. Sausage. Bologna and other processed lunch meats. Salami. Fatback. Hotdogs. Bratwurst. Salted nuts and seeds. Canned beans with added salt. Canned or smoked fish. Whole eggs or egg yolks. Chicken or turkey with skin. Dairy Whole or 2% milk, cream, and half-and-half. Whole or full-fat cream cheese. Whole-fat or sweetened yogurt. Full-fat cheese. Nondairy creamers. Whipped toppings.  Processed cheese and cheese spreads. Fats and oils Butter. Stick margarine. Lard. Shortening. Ghee. Bacon fat. Tropical oils, such as coconut, palm kernel, or palm oil. Seasoning and other foods Salted popcorn and pretzels. Onion salt, garlic salt, seasoned salt, table salt, and sea salt. Worcestershire sauce. Tartar sauce. Barbecue sauce. Teriyaki sauce. Soy sauce, including reduced-sodium. Steak sauce. Canned and packaged gravies. Fish sauce. Oyster sauce. Cocktail sauce. Horseradish that you find on the shelf. Ketchup. Mustard. Meat flavorings and tenderizers. Bouillon cubes. Hot sauce and Tabasco sauce. Premade or packaged marinades. Premade or packaged taco seasonings. Relishes. Regular salad dressings. Where to find more information:  National Heart, Lung, and Blood Institute: www.nhlbi.nih.gov  American Heart Association: www.heart.org Summary  The DASH eating plan is a healthy eating plan that has been shown to reduce high blood pressure (hypertension). It may also reduce your risk for type 2 diabetes, heart disease, and stroke.  With the DASH eating plan, you should limit salt (sodium) intake to 2,300 mg a day. If you have hypertension, you may need to reduce your sodium intake to 1,500 mg a day.  When on the DASH eating plan, aim to eat more fresh fruits and vegetables, whole grains, lean proteins, low-fat dairy, and heart-healthy fats.  Work with your health care provider or diet and nutrition specialist (dietitian) to adjust your eating plan to your individual   calorie needs. This information is not intended to replace advice given to you by your health care provider. Make sure you discuss any questions you have with your health care provider. Document Released: 11/20/2011 Document Revised: 11/24/2016 Document Reviewed: 11/24/2016 Elsevier Interactive Patient Education  2018 Elsevier Inc.  

## 2018-02-12 ENCOUNTER — Telehealth: Payer: Self-pay

## 2018-02-12 LAB — COMPREHENSIVE METABOLIC PANEL
A/G RATIO: 1.7 (ref 1.2–2.2)
ALK PHOS: 88 IU/L (ref 39–117)
ALT: 26 IU/L (ref 0–32)
AST: 22 IU/L (ref 0–40)
Albumin: 4.3 g/dL (ref 3.5–4.8)
BUN/Creatinine Ratio: 25 (ref 12–28)
BUN: 19 mg/dL (ref 8–27)
Bilirubin Total: 0.5 mg/dL (ref 0.0–1.2)
CO2: 24 mmol/L (ref 20–29)
Calcium: 9.9 mg/dL (ref 8.7–10.3)
Chloride: 101 mmol/L (ref 96–106)
Creatinine, Ser: 0.75 mg/dL (ref 0.57–1.00)
GFR calc Af Amer: 91 mL/min/{1.73_m2} (ref >59)
GFR calc non Af Amer: 79 mL/min/{1.73_m2} (ref >59)
Globulin, Total: 2.6 g/dL (ref 1.5–4.5)
Glucose: 95 mg/dL (ref 65–99)
POTASSIUM: 4.2 mmol/L (ref 3.5–5.2)
SODIUM: 140 mmol/L (ref 134–144)
Total Protein: 6.9 g/dL (ref 6.0–8.5)

## 2018-02-12 LAB — CBC
Hematocrit: 39.9 % (ref 34.0–46.6)
Hemoglobin: 13.2 g/dL (ref 11.1–15.9)
MCH: 28.3 pg (ref 26.6–33.0)
MCHC: 33.1 g/dL (ref 31.5–35.7)
MCV: 85 fL (ref 79–97)
Platelets: 251 10*3/uL (ref 150–379)
RBC: 4.67 x10E6/uL (ref 3.77–5.28)
RDW: 14.2 % (ref 12.3–15.4)
WBC: 8.5 10*3/uL (ref 3.4–10.8)

## 2018-02-12 LAB — TSH: TSH: 0.876 u[IU]/mL (ref 0.450–4.500)

## 2018-02-12 NOTE — Telephone Encounter (Signed)
Patient advised as directed below.  Thanks,  -Joseline 

## 2018-02-12 NOTE — Telephone Encounter (Signed)
-----   Message from Mar Daring, PA-C sent at 02/12/2018  9:29 AM EST ----- All labs are within normal limits and stable.  Thanks! -JB

## 2018-03-09 DIAGNOSIS — Z96651 Presence of right artificial knee joint: Secondary | ICD-10-CM | POA: Diagnosis not present

## 2018-03-10 ENCOUNTER — Telehealth: Payer: Self-pay

## 2018-03-10 DIAGNOSIS — I10 Essential (primary) hypertension: Secondary | ICD-10-CM

## 2018-03-10 NOTE — Telephone Encounter (Signed)
Please Review.  Thanks,  -Joseline 

## 2018-03-10 NOTE — Telephone Encounter (Signed)
Patient called office to request a prescription for new blood pressure medication. Patient states that she was told by pharmacist that [place] is on back order and pharmacist stated to her that they did not know when it was gonna be back in [place]. Please advise. KW.

## 2018-03-11 MED ORDER — HYDROCHLOROTHIAZIDE 12.5 MG PO CAPS: 12.5000 mg | ORAL_CAPSULE | Freq: Every day | ORAL | 1 refills | Status: DC

## 2018-03-11 MED ORDER — BENAZEPRIL HCL 20 MG PO TABS: 20.0000 mg | ORAL_TABLET | Freq: Every day | ORAL | 1 refills | Status: DC

## 2018-03-11 NOTE — Telephone Encounter (Signed)
Sent in

## 2018-03-11 NOTE — Telephone Encounter (Signed)
Has she ever tried lisinopril? Other thought is we could order the two separately until available back in a combo pill.  Which would she prefer?

## 2018-03-11 NOTE — Telephone Encounter (Signed)
Per patient she wants to do two separate pills.  Thanks,  -Joseline

## 2018-03-20 ENCOUNTER — Other Ambulatory Visit: Payer: Self-pay | Admitting: Physician Assistant

## 2018-03-20 DIAGNOSIS — E119 Type 2 diabetes mellitus without complications: Secondary | ICD-10-CM

## 2018-04-06 ENCOUNTER — Encounter: Payer: Self-pay | Admitting: Physician Assistant

## 2018-04-06 ENCOUNTER — Ambulatory Visit (INDEPENDENT_AMBULATORY_CARE_PROVIDER_SITE_OTHER): Payer: Medicare Other | Admitting: Physician Assistant

## 2018-04-06 VITALS — BP 142/96 | HR 67 | Temp 98.3°F | Wt 203.2 lb

## 2018-04-06 DIAGNOSIS — K5732 Diverticulitis of large intestine without perforation or abscess without bleeding: Secondary | ICD-10-CM | POA: Diagnosis not present

## 2018-04-06 DIAGNOSIS — I10 Essential (primary) hypertension: Secondary | ICD-10-CM

## 2018-04-06 MED ORDER — BENAZEPRIL HCL 40 MG PO TABS: 40.0000 mg | ORAL_TABLET | Freq: Every day | ORAL | 3 refills | Status: DC

## 2018-04-06 MED ORDER — SULFAMETHOXAZOLE-TRIMETHOPRIM 800-160 MG PO TABS: 1.0000 | ORAL_TABLET | Freq: Two times a day (BID) | ORAL | 0 refills | Status: DC

## 2018-04-06 NOTE — Progress Notes (Signed)
Patient: Wendy Barnes Female    DOB: May 09, 1941   77 y.o.   MRN: [id number] Visit Date: 04/06/2018  Today's Provider: Mar Daring, PA-C   Chief Complaint  Patient presents with  . Abdominal Pain   Subjective:    Abdominal Pain  This is a recurrent problem. Episode onset: Sunday. The problem has been unchanged. The pain is located in the periumbilical region. The quality of the pain is aching. Associated symptoms include diarrhea. Exacerbated by: possibly from eating a lot of fruits with seeds on Sunday. Treatments tried: Zantac. The treatment provided no relief. diverticulitis   She states this feels exactly like her previous diverticulitis flares. She is having some mild diarrhea.   Allergies  Allergen Reactions  . Amoxicillin Hives and Rash    Has patient had a PCN reaction causing immediate rash, facial/tongue/throat swelling, SOB or lightheadedness with hypotension: Yes Has patient had a PCN reaction causing severe rash involving mucus membranes or skin necrosis: Yes Has patient had a PCN reaction that required hospitalization: No Has patient had a PCN reaction occurring within the last 10 years: Yes If all of the above answers are "NO", then may proceed with Cephalosporin use.   [name] Penicillins Hives and Rash     Current Outpatient Medications:  .  ALPRAZolam (XANAX) 0.5 MG tablet, Take 0.5-1 tablets (0.25-0.5 mg total) by mouth at bedtime as needed. (Patient taking differently: Take 0.25 mg by mouth at bedtime as needed for sleep. ), Disp: 90 tablet, Rfl: 1 .  aspirin 81 MG tablet, Take 81 mg by mouth at bedtime. , Disp: , Rfl:  .  benazepril (LOTENSIN) 20 MG tablet, Take 1 tablet (20 mg total) by mouth daily., Disp: 30 tablet, Rfl: 1 .  Biotin 2500 MCG CAPS, Take 2,500 mcg by mouth daily., Disp: , Rfl:  .  Calcium Carbonate-Vitamin D 600-200 MG-UNIT CAPS, Take 1 tablet by mouth daily with supper. , Disp: , Rfl:  .  hydrochlorothiazide (MICROZIDE) 12.5 MG  capsule, Take 1 capsule (12.5 mg total) by mouth daily., Disp: 30 capsule, Rfl: 1 .  ibuprofen (ADVIL,MOTRIN) 200 MG tablet, Take 400 mg by mouth daily as needed for headache or moderate pain., Disp: , Rfl:  .  levothyroxine (SYNTHROID, LEVOTHROID) 100 MCG tablet, Take 1 tablet (100 mcg total) by mouth daily before breakfast., Disp: 90 tablet, Rfl: 3 .  metFORMIN (GLUCOPHAGE) 500 MG tablet, TAKE ONE TABLET BY MOUTH DAILY WITH BREAKFAST, Disp: 90 tablet, Rfl: 1 .  MULTIPLE VITAMIN PO, Take 1 tablet by mouth daily. , Disp: , Rfl:  .  Omega-3 Fatty Acids (FISH OIL ULTRA) 1400 MG CAPS, Take 1,400 mg by mouth daily., Disp: , Rfl:  .  oxybutynin (DITROPAN) 5 MG tablet, Take 1 tablet (5 mg total) by mouth daily. (Patient taking differently: Take 5 mg by mouth daily with breakfast. ), Disp: 90 tablet, Rfl: 3 .  pravastatin (PRAVACHOL) 10 MG tablet, Take 1 tablet (10 mg total) by mouth daily. (Patient taking differently: Take 10 mg by mouth at bedtime. ), Disp: 90 tablet, Rfl: 3 .  ranitidine (ZANTAC) 150 MG tablet, Take 150 mg by mouth daily as needed for heartburn., Disp: , Rfl:   Review of Systems  Constitutional: Negative.   Respiratory: Negative.   Cardiovascular: Negative.   Gastrointestinal: Positive for abdominal pain and diarrhea.  Neurological: Negative.     Social History   Tobacco Use  . Smoking status: Former Smoker    Packs/day:  1.00    Years: 25.00    Pack years: 25.00    Types: Cigarettes    Last attempt to quit: 12/15/1995    Years since quitting: 22.3  . Smokeless tobacco: Never Used  . Tobacco comment: quit in 1990's  Substance Use Topics  . Alcohol use: Yes    Alcohol/week: 0.0 oz    Comment: occasionally   Objective:   BP (!) 142/96 (BP Location: Right Arm, Patient Position: Sitting, Cuff Size: Normal)   Pulse 67   Temp 98.3 F (36.8 C) (Oral)   Wt 203 lb 3.2 oz (92.2 kg)   SpO2 97%   BMI 33.30 kg/m     Physical Exam  Constitutional: She is oriented to  person, place, and time. She appears well-developed and well-nourished. No distress.  Cardiovascular: Normal rate, regular rhythm and normal heart sounds. Exam reveals no gallop and no friction rub.  No murmur heard. Pulmonary/Chest: Effort normal and breath sounds normal. No respiratory distress. She has no wheezes. She has no rales.  Abdominal: Soft. Normal appearance and bowel sounds are normal. She exhibits no distension and no mass. There is no hepatosplenomegaly. There is tenderness in the left lower quadrant. There is no rebound, no guarding and no CVA tenderness.  Neurological: She is alert and oriented to person, place, and time.  Skin: Skin is warm and dry. She is not diaphoretic.  Vitals reviewed.      Assessment & Plan:     1. Diverticulitis of large intestine without perforation or abscess without bleeding Clear liquid diet until pain improves then gradually increase diet. Bactrim for infection. Warning return precautions verbally given to patient. She voiced understanding.  - sulfamethoxazole-trimethoprim (BACTRIM DS,SEPTRA DS) 800-160 MG tablet; Take 1 tablet by mouth 2 (two) times daily.  Dispense: 14 tablet; Refill: 0  2. Essential hypertension Not as well controlled since changing from losartan due to recall. Will increase dose to 40mg  as below. I will see her back next week to recheck when she returns for her CPE.  - benazepril (LOTENSIN) 40 MG tablet; Take 1 tablet (40 mg total) by mouth daily.  Dispense: 90 tablet; Refill: [place], PA-C  [facility]

## 2018-04-06 NOTE — Patient Instructions (Signed)
Diverticulitis °Diverticulitis is infection or inflammation of small pouches (diverticula) in the colon that form due to a condition called diverticulosis. Diverticula can trap stool (feces) and bacteria, causing infection and inflammation. °Diverticulitis may cause severe stomach pain and diarrhea. It may lead to tissue damage in the colon that causes bleeding. The diverticula may also burst (rupture) and cause infected stool to enter other areas of the abdomen. °Complications of diverticulitis can include: °· Bleeding. °· Severe infection. °· Severe pain. °· Rupture (perforation) of the colon. °· Blockage (obstruction) of the colon. ° °What are the causes? °This condition is caused by stool becoming trapped in the diverticula, which allows bacteria to grow in the diverticula. This leads to inflammation and infection. °What increases the risk? °You are more likely to develop this condition if: °· You have diverticulosis. The risk for diverticulosis increases if: °? You are overweight or obese. °? You use tobacco products. °? You do not get enough exercise. °· You eat a diet that does not include enough fiber. High-fiber foods include fruits, vegetables, beans, nuts, and whole grains. ° °What are the signs or symptoms? °Symptoms of this condition may include: °· Pain and tenderness in the abdomen. The pain is normally located on the left side of the abdomen, but it may occur in other areas. °· Fever and chills. °· Bloating. °· Cramping. °· Nausea. °· Vomiting. °· Changes in bowel routines. °· Blood in your stool. ° °How is this diagnosed? °This condition is diagnosed based on: °· Your medical history. °· A physical exam. °· Tests to make sure there is nothing else causing your condition. These tests may include: °? Blood tests. °? Urine tests. °? Imaging tests of the abdomen, including X-rays, ultrasounds, MRIs, or CT scans. ° °How is this treated? °Most cases of this condition are mild and can be treated at home.  Treatment may include: °· Taking over-the-counter pain medicines. °· Following a clear liquid diet. °· Taking antibiotic medicines by mouth. °· Rest. ° °More severe cases may need to be treated at a hospital. Treatment may include: °· Not eating or drinking. °· Taking prescription pain medicine. °· Receiving antibiotic medicines through an IV tube. °· Receiving fluids and nutrition through an IV tube. °· Surgery. ° °When your condition is under control, your health care provider may recommend that you have a colonoscopy. This is an exam to look at the entire large intestine. During the exam, a lubricated, bendable tube is inserted into the anus and then passed into the rectum, colon, and other parts of the large intestine. A colonoscopy can show how severe your diverticula are and whether something else may be causing your symptoms. °Follow these instructions at home: °Medicines °· Take over-the-counter and prescription medicines only as told by your health care provider. These include fiber supplements, probiotics, and stool softeners. °· If you were prescribed an antibiotic medicine, take it as told by your health care provider. Do not stop taking the antibiotic even if you start to feel better. °· Do not drive or use heavy machinery while taking prescription pain medicine. °General instructions °· Follow a full liquid diet or another diet as directed by your health care provider. After your symptoms improve, your health care provider may tell you to change your diet. He or she may recommend that you eat a diet that contains at least 25 g (25 grams) of fiber daily. Fiber makes it easier to pass stool. Healthy sources of fiber include: °? Berries. One cup   contains 4-8 grams of fiber. °? Beans or lentils. One half cup contains 5-8 grams of fiber. °? Green vegetables. One cup contains 4 grams of fiber. °· Exercise for at least 30 minutes, 3 times each week. You should exercise hard enough to raise your heart rate and  break a sweat. °· Keep all follow-up visits as told by your health care provider. This is important. You may need a colonoscopy. °Contact a health care provider if: °· Your pain does not improve. °· You have a hard time drinking or eating food. °· Your bowel movements do not return to normal. °Get help right away if: °· Your pain gets worse. °· Your symptoms do not get better with treatment. °· Your symptoms suddenly get worse. °· You have a fever. °· You vomit more than one time. °· You have stools that are bloody, black, or tarry. °Summary °· Diverticulitis is infection or inflammation of small pouches (diverticula) in the colon that form due to a condition called diverticulosis. Diverticula can trap stool (feces) and bacteria, causing infection and inflammation. °· You are at higher risk for this condition if you have diverticulosis and you eat a diet that does not include enough fiber. °· Most cases of this condition are mild and can be treated at home. More severe cases may need to be treated at a hospital. °· When your condition is under control, your health care provider may recommend that you have an exam called a colonoscopy. This exam can show how severe your diverticula are and whether something else may be causing your symptoms. °This information is not intended to replace advice given to you by your health care provider. Make sure you discuss any questions you have with your health care provider. °Document Released: 09/10/2005 Document Revised: 01/03/2017 Document Reviewed: 01/03/2017 °Elsevier Interactive Patient Education © 2018 [employer] ° °

## 2018-04-07 ENCOUNTER — Other Ambulatory Visit: Payer: Self-pay | Admitting: Physician Assistant

## 2018-04-07 DIAGNOSIS — I10 Essential (primary) hypertension: Secondary | ICD-10-CM

## 2018-04-07 MED ORDER — HYDROCHLOROTHIAZIDE 12.5 MG PO CAPS: 12.5000 mg | ORAL_CAPSULE | Freq: Every day | ORAL | 1 refills | Status: DC

## 2018-04-07 NOTE — Telephone Encounter (Signed)
Pt has questions about her new blood pressure medication and would like to talk to a nurse,  Her call back is [phone]  Thanks teri

## 2018-04-07 NOTE — Telephone Encounter (Signed)
Patient states she was seen yesterday and had the Benazepril increased to 40mg . Patient states the medication that was sent in is the plain Benazepril without the HCTZ. Patient wants to know if she is supposed to contine taking the HCTZ 12.5mg  along with the Benazepril 40mg ? If so, patient needs a refill of the HCTZ sent to the pharmacy. Please advise. [facility].

## 2018-04-07 NOTE — Telephone Encounter (Signed)
Her call phone number is [phone]  [place]

## 2018-04-08 DIAGNOSIS — D1801 Hemangioma of skin and subcutaneous tissue: Secondary | ICD-10-CM | POA: Diagnosis not present

## 2018-04-08 DIAGNOSIS — Z85828 Personal history of other malignant neoplasm of skin: Secondary | ICD-10-CM | POA: Diagnosis not present

## 2018-04-08 DIAGNOSIS — L821 Other seborrheic keratosis: Secondary | ICD-10-CM | POA: Diagnosis not present

## 2018-04-08 DIAGNOSIS — L812 Freckles: Secondary | ICD-10-CM | POA: Diagnosis not present

## 2018-04-08 DIAGNOSIS — L578 Other skin changes due to chronic exposure to nonionizing radiation: Secondary | ICD-10-CM | POA: Diagnosis not present

## 2018-04-08 DIAGNOSIS — L820 Inflamed seborrheic keratosis: Secondary | ICD-10-CM | POA: Diagnosis not present

## 2018-04-08 DIAGNOSIS — I781 Nevus, non-neoplastic: Secondary | ICD-10-CM | POA: Diagnosis not present

## 2018-04-08 DIAGNOSIS — Z1283 Encounter for screening for malignant neoplasm of skin: Secondary | ICD-10-CM | POA: Diagnosis not present

## 2018-04-08 DIAGNOSIS — I8393 Asymptomatic varicose veins of bilateral lower extremities: Secondary | ICD-10-CM | POA: Diagnosis not present

## 2018-04-09 ENCOUNTER — Other Ambulatory Visit: Payer: Self-pay | Admitting: Physician Assistant

## 2018-04-09 DIAGNOSIS — Z1231 Encounter for screening mammogram for malignant neoplasm of breast: Secondary | ICD-10-CM

## 2018-04-19 ENCOUNTER — Other Ambulatory Visit: Payer: Self-pay

## 2018-04-19 ENCOUNTER — Ambulatory Visit (INDEPENDENT_AMBULATORY_CARE_PROVIDER_SITE_OTHER): Payer: Medicare Other

## 2018-04-19 ENCOUNTER — Ambulatory Visit (INDEPENDENT_AMBULATORY_CARE_PROVIDER_SITE_OTHER): Payer: Medicare Other | Admitting: Physician Assistant

## 2018-04-19 ENCOUNTER — Encounter: Payer: Self-pay | Admitting: Physician Assistant

## 2018-04-19 VITALS — BP 138/62 | HR 64 | Temp 98.2°F | Ht 66.0 in | Wt 204.4 lb

## 2018-04-19 DIAGNOSIS — Z1239 Encounter for other screening for malignant neoplasm of breast: Secondary | ICD-10-CM

## 2018-04-19 DIAGNOSIS — F5101 Primary insomnia: Secondary | ICD-10-CM

## 2018-04-19 DIAGNOSIS — E7800 Pure hypercholesterolemia, unspecified: Secondary | ICD-10-CM | POA: Diagnosis not present

## 2018-04-19 DIAGNOSIS — E119 Type 2 diabetes mellitus without complications: Secondary | ICD-10-CM | POA: Diagnosis not present

## 2018-04-19 DIAGNOSIS — I10 Essential (primary) hypertension: Secondary | ICD-10-CM

## 2018-04-19 DIAGNOSIS — Z0000 Encounter for general adult medical examination without abnormal findings: Secondary | ICD-10-CM

## 2018-04-19 DIAGNOSIS — Z1231 Encounter for screening mammogram for malignant neoplasm of breast: Secondary | ICD-10-CM | POA: Diagnosis not present

## 2018-04-19 MED ORDER — HYDROCHLOROTHIAZIDE 12.5 MG PO CAPS: 12.5000 mg | ORAL_CAPSULE | Freq: Every day | ORAL | 1 refills | Status: DC

## 2018-04-19 MED ORDER — ALPRAZOLAM 0.5 MG PO TABS: 0.2500 mg | ORAL_TABLET | Freq: Every evening | ORAL | 1 refills | Status: DC | PRN

## 2018-04-19 NOTE — Progress Notes (Signed)
Patient: Wendy Barnes, Female    DOB: [birth year], 77 y.o.   MRN: [id number] Visit Date: 04/19/2018  Today's Provider: Mar Daring, PA-C   Chief Complaint  Patient presents with  . Annual Exam   Subjective:    Wendy Barnes is a 77 y.o. female who presents today for health maintenance and to follow up her annual wellness visit she had with [facility].  She feels fairly well. She reports exercising at least 3 days a week for 15-20 minutes on her treadmill. She reports she is sleeping ok, takes xanax before bed but still is up and down at night for urination but is able to fall back asleep ok.  -----------------------------------------------------------------   Review of Systems  Constitutional: Negative.   HENT: Negative.   Eyes: Negative.   Respiratory: Negative.   Cardiovascular: Negative for chest pain.  Gastrointestinal: Negative for abdominal pain.  Endocrine: Negative.   Genitourinary: Negative.   Musculoskeletal: Negative.   Neurological: Negative.  Negative for dizziness and headaches.  Psychiatric/Behavioral: Negative.     Social History      She  reports that she quit smoking about 22 years ago. Her smoking use included cigarettes. She has a 25.00 pack-year smoking history. She has never used smokeless tobacco. She reports that she drinks alcohol. She reports that she does not use drugs.       Social History   Socioeconomic History  . Marital status: Widowed    Spouse name: Not on file  . Number of children: 4  . Years of education: Not on file  . Highest education level: 12th grade  Occupational History  . Occupation: retired  Scientific laboratory technician  . Financial resource strain: Not hard at all  . Food insecurity:    Worry: Never true    Inability: Not on file  . Transportation needs:    Medical: No    Non-medical: No  Tobacco Use  . Smoking status: Former Smoker    Packs/day: 1.00    Years: 25.00    Pack years: 25.00    Types: Cigarettes    Last  attempt to quit: 12/15/1995    Years since quitting: 22.3  . Smokeless tobacco: Never Used  . Tobacco comment: quit in 1990's  Substance and Sexual Activity  . Alcohol use: Yes    Alcohol/week: 0.0 oz    Comment: occasionally - glass of wine  . Drug use: No  . Sexual activity: Not on file  Lifestyle  . Physical activity:    Days per week: Not on file    Minutes per session: Not on file  . Stress: Not at all  Relationships  . Social connections:    Talks on phone: Not on file    Gets together: Not on file    Attends religious service: Not on file    Active member of club or organization: Not on file    Attends meetings of clubs or organizations: Not on file    Relationship status: Not on file  Other Topics Concern  . Not on file  Social History Narrative  . Not on file    Past Medical History:  Diagnosis Date  . Anxiety   . Cancer ([place])    skin  . Hyperchloremia   . Hypertension   . Hypothyroidism   . T2DM (type 2 diabetes mellitus) ([place])   . Thyroid disease   . Varicella   . Vertigo      Patient Active Problem  List   Diagnosis Date Noted  . Hypothyroidism 02/11/2018  . Osteoarthritis of spine with radiculopathy, lumbar region 04/16/2016  . Status post total right knee replacement 02/24/2016  . Colon polyp 05/16/2015  . Diverticulitis 05/16/2015  . Dysfunction of eustachian tube 05/16/2015  . T2DM (type 2 diabetes mellitus) ([place]) 05/16/2015  . Gonalgia 05/16/2015  . Plantar fasciitis 05/16/2015  . 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 Vaginal lesion 05/16/2015  . Diverticulitis of intestine without perforation or abscess without bleeding 05/16/2015  . Hyperglycemia, unspecified 05/16/2015  . Burning or prickling sensation 04/10/2010  . Cannot sleep 12/10/2007  . Dermatologic disease 09/15/2007  . BP (high blood pressure) 08/28/2006  . Hypercholesteremia 08/28/2006  . Mixed incontinence 08/28/2006    Past Surgical History:  Procedure Laterality  Date  . CATARACT EXTRACTION W/PHACO Left 11/17/2017   Procedure: CATARACT EXTRACTION PHACO AND INTRAOCULAR LENS PLACEMENT (IOC)-LEFT DIABETIC;  Surgeon: Birder Robson, MD;  Location: [facility] ORS;  Service: Ophthalmology;  Laterality: Left;  [place] 00:36 AP% 15.1 CDE 5.46 Fluid pack lot # [id number]  . CATARACT EXTRACTION W/PHACO Right 12/22/2017   Procedure: CATARACT EXTRACTION PHACO AND INTRAOCULAR LENS PLACEMENT ([place]);  Surgeon: Birder Robson, MD;  Location: [facility] ORS;  Service: Ophthalmology;  Laterality: Right;  [place] 00:46.1 AP% 12.9 CDE 5.97 Fluid pack lot # [id number]  . COLONOSCOPY WITH PROPOFOL N/A 04/24/2016   Procedure: COLONOSCOPY WITH PROPOFOL;  Surgeon: Manya Silvas, MD;  Location[facility] END[facility]COPY;  Service: Endoscopy;  Laterality: N/A;  . DILATION AND CURETTAGE OF UTERUS  2002  . JOINT REPLACEMENT Right   . 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 THYROIDECTOMY  2010  . TONSILLECTOMY    . TONSILLECTOMY AND ADENOIDECTOMY  1950  . TOTAL KNEE ARTHROPLASTY  2015   right knee    Family History        Family Status  Relation Name Status  . Father  Deceased at age 56       heart disease  . Mother  Deceased at age 28       heart disease  . Sister  Alive  . Daughter  Deceased  . Neg Hx  (Not Specified)        Her family history includes Stroke in her father. There is no history of Breast cancer.      Allergies  Allergen Reactions  . Amoxicillin Hives and Rash    Has patient had a PCN reaction causing immediate rash, facial/tongue/throat swelling, SOB or lightheadedness with hypotension: Yes Has patient had a PCN reaction causing severe rash involving mucus membranes or skin necrosis: Yes Has patient had a PCN reaction that required hospitalization: No Has patient had a PCN reaction occurring within the last 10 years: Yes If all of the above answers are "NO", then may proceed with Cephalosporin use.   [name] Penicillins Hives and Rash     Current Outpatient Medications:    .  ALPRAZolam (XANAX) 0.5 MG tablet, Take 0.5-1 tablets (0.25-0.5 mg total) by mouth at bedtime as needed. (Patient taking differently: Take 0.25 mg by mouth at bedtime as needed for sleep. ), Disp: 90 tablet, Rfl: 1 .  aspirin 81 MG tablet, Take 81 mg by mouth at bedtime. , Disp: , Rfl:  .  benazepril (LOTENSIN) 40 MG tablet, Take 1 tablet (40 mg total) by mouth daily., Disp: 90 tablet, Rfl: 3 .  Biotin 2500 MCG CAPS, Take 2,500 mcg by mouth daily., Disp: , Rfl:  .  Calcium Carbonate-Vitamin D 600-200 MG-UNIT  CAPS, Take 1 tablet by mouth daily with supper. , Disp: , Rfl:  .  hydrochlorothiazide (MICROZIDE) 12.5 MG capsule, Take 1 capsule (12.5 mg total) by mouth daily., Disp: 90 capsule, Rfl: 1 .  ibuprofen (ADVIL,MOTRIN) 200 MG tablet, Take 400 mg by mouth daily as needed for headache or moderate pain., Disp: , Rfl:  .  levothyroxine (SYNTHROID, LEVOTHROID) 100 MCG tablet, Take 1 tablet (100 mcg total) by mouth daily before breakfast., Disp: 90 tablet, Rfl: 3 .  metFORMIN (GLUCOPHAGE) 500 MG tablet, TAKE ONE TABLET BY MOUTH DAILY WITH BREAKFAST, Disp: 90 tablet, Rfl: 1 .  MULTIPLE VITAMIN PO, Take 1 tablet by mouth daily. , Disp: , Rfl:  .  Omega-3 Fatty Acids (FISH OIL ULTRA) 1400 MG CAPS, Take 1,400 mg by mouth daily., Disp: , Rfl:  .  oxybutynin (DITROPAN) 5 MG tablet, Take 1 tablet (5 mg total) by mouth daily. (Patient taking differently: Take 5 mg by mouth daily with breakfast. ), Disp: 90 tablet, Rfl: 3 .  pravastatin (PRAVACHOL) 10 MG tablet, Take 1 tablet (10 mg total) by mouth daily. (Patient taking differently: Take 10 mg by mouth at bedtime. ), Disp: 90 tablet, Rfl: 3 .  ranitidine (ZANTAC) 150 MG tablet, Take 150 mg by mouth daily as needed for heartburn., Disp: , Rfl:    Patient Care Team: Mar Daring, PA-C as PCP - General (Family Medicine) Birder Robson, MD as Referring Physician (Ophthalmology) Ralene Bathe, MD as Consulting Physician (Dermatology) Marry Guan,  Laurice Record, MD as Consulting Physician (Orthopedic Surgery)      Objective:   Vitals: BP 138/62   Pulse 64   Temp 98.2 F (36.8 C)   Ht 5\' 6"  (1.676 m)   Wt 204 lb 6.4 oz (92.7 kg)   BMI 32.99 kg/m    Vitals:   04/19/18 1001  BP: 138/62  Pulse: 64  Temp: 98.2 F (36.8 C)  Weight: 204 lb 6.4 oz (92.7 kg)  Height: 5\' 6"  (1.676 m)     Physical Exam  Constitutional: She is oriented to person, place, and time. She appears well-developed and well-nourished. No distress.  HENT:  Head: Normocephalic and atraumatic.  Right Ear: Hearing, tympanic membrane, external ear and ear canal normal.  Left Ear: Hearing, tympanic membrane, external ear and ear canal normal.  Nose: Nose normal.  Mouth/Throat: Uvula is midline, oropharynx is clear and moist and mucous membranes are normal. No oropharyngeal exudate.  Eyes: Pupils are equal, round, and reactive to light. Conjunctivae and EOM are normal. Right eye exhibits no discharge. Left eye exhibits no discharge. No scleral icterus.  Neck: Normal range of motion. Neck supple. No JVD present. Carotid bruit is not present. No tracheal deviation present. No thyromegaly present.  Cardiovascular: Normal rate, regular rhythm, normal heart sounds and intact distal pulses. Exam reveals no gallop and no friction rub.  No murmur heard. Pulmonary/Chest: Effort normal and breath sounds normal. No respiratory distress. She has no wheezes. She has no rales. She exhibits no tenderness.  Abdominal: Soft. Bowel sounds are normal. She exhibits no distension and no mass. There is no tenderness. There is no rebound and no guarding.  Musculoskeletal: Normal range of motion. She exhibits no edema or tenderness.  Lymphadenopathy:    She has no cervical adenopathy.  Neurological: She is alert and oriented to person, place, and time.  Skin: Skin is warm and dry. No rash noted. She is not diaphoretic.  Psychiatric: She has a normal mood and affect. Her  behavior is normal.  Judgment and thought content normal.  Vitals reviewed.    Depression Screen PHQ 2/9 Scores 04/19/2018 04/17/2017 04/17/2017 04/16/2016  PHQ - 2 Score 0 1 1 0  PHQ- 9 Score - 3 - -      Assessment & Plan:     Routine Health Maintenance and Physical Exam  Exercise Activities and Dietary recommendations Goals    . DIET - REDUCE FAT INTAKE     Recommend cutting out all fried foods to help aid in weight loss.        Immunization History  Administered Date(s) Administered  . Influenza, High Dose Seasonal PF 10/16/2015, 08/20/2016, 09/28/2017  . Pneumococcal Conjugate-13 10/30/2014  . Pneumococcal Polysaccharide-23 01/02/2010  . Zoster 02/08/2009    Health Maintenance  Topic Date Due  . Samul Dada  09/03/1962  . HEMOGLOBIN A1C  03/29/2018  . INFLUENZA VACCINE  07/15/2018  . FOOT EXAM  09/28/2018  . OPHTHALMOLOGY EXAM  12/29/2018  . MAMMOGRAM  05/21/2019  . COLONOSCOPY  04/24/2026  . DEXA SCAN  Completed  . PNA vac Low Risk Adult  Completed     Discussed health benefits of physical activity, and encouraged her to engage in regular exercise appropriate for her age and condition.    1. Essential hypertension Stable. Diagnosis pulled for medication refill. Continue current medical treatment plan. Continue Benazepril 40mg  daily as well.  - hydrochlorothiazide (MICROZIDE) 12.5 MG capsule; Take 1 capsule (12.5 mg total) by mouth daily.  Dispense: 90 capsule; Refill: 1  2. Primary insomnia Stable. Diagnosis pulled for medication refill. Continue current medical treatment plan. - ALPRAZolam (XANAX) 0.5 MG tablet; Take 0.5 tablets (0.25 mg total) by mouth at bedtime as needed for sleep.  Dispense: 45 tablet; Refill: 1  3. Type 2 diabetes mellitus without complication, without long-term current use of insulin (HCC) Continue metformin 500mg  daily. Will check labs as below and f/u pending results. - HgB A1c  4. Hypercholesteremia Continue Pravastatin 10mg . Will check labs as  below and f/u pending results. - Lipid Profile  5. Breast cancer screening Mammogram scheduled for June 2019.   --------------------------------------------------------------------    Mar Daring, PA-C  [facility]

## 2018-04-19 NOTE — Progress Notes (Signed)
Subjective:   Wendy Barnes is a 77 y.o. female who presents for Medicare Annual (Subsequent) preventive examination.  Review of Systems:  N/A  Cardiac Risk Factors include: advanced age (>54men, >30 women);diabetes mellitus;dyslipidemia;hypertension;obesity (BMI >30kg/m2)     Objective:     Vitals: BP 138/62 (BP Location: Right Arm)   Pulse 64   Temp 98.2 F (36.8 C) (Oral)   Ht 5\' 6"  (1.676 m)   Wt 204 lb 6.4 oz (92.7 kg)   BMI 32.99 kg/m   Body mass index is 32.99 kg/m.  Advanced Directives 04/19/2018 12/22/2017 04/17/2017 04/24/2016 04/16/2016 01/16/2016 07/16/2015  Does Patient Have a Medical Advance Directive? No No No No No No Yes  Type of Advance Directive - - - - - - Press photographer  Does patient want to make changes to medical advance directive? - - Yes (ED - Information included in AVS) - - - -  Would patient like information on creating a medical advance directive? No - Patient declined No - Patient declined - No - patient declined information - - -    Tobacco Social History   Tobacco Use  Smoking Status Former Smoker  . Packs/day: 1.00  . Years: 25.00  . Pack years: 25.00  . Types: Cigarettes  . Last attempt to quit: 12/15/1995  . Years since quitting: 22.3  Smokeless Tobacco Never Used  Tobacco Comment   quit in 1990's     Counseling given: Not Answered Comment: quit in 1990's   Clinical Intake:  Pre-visit preparation completed: Yes  Pain : No/denies pain Pain Score: 0-No pain     Nutritional Status: BMI > 30  Obese Nutritional Risks: None Diabetes: Yes(type 2) CBG done?: No Did pt. bring in CBG monitor from home?: No  How often do you need to have someone help you when you read instructions, pamphlets, or other written materials from your doctor or pharmacy?: 1 - Never  Interpreter Needed?: No  Information entered by :: [facility], LPN  Past Medical History:  Diagnosis Date  . Anxiety   . Cancer ([place])    skin  .  Hyperchloremia   . Hypertension   . Hypothyroidism   . T2DM (type 2 diabetes mellitus) ([place])   . Thyroid disease   . Varicella   . Vertigo    Past Surgical History:  Procedure Laterality Date  . CATARACT EXTRACTION W/PHACO Left 11/17/2017   Procedure: CATARACT EXTRACTION PHACO AND INTRAOCULAR LENS PLACEMENT (IOC)-LEFT DIABETIC;  Surgeon: Birder Robson, MD;  Location: [facility] ORS;  Service: Ophthalmology;  Laterality: Left;  [place] 00:36 AP% 15.1 CDE 5.46 Fluid pack lot # [id number]  . CATARACT EXTRACTION W/PHACO Right 12/22/2017   Procedure: CATARACT EXTRACTION PHACO AND INTRAOCULAR LENS PLACEMENT ([place]);  Surgeon: Birder Robson, MD;  Location: [facility] ORS;  Service: Ophthalmology;  Laterality: Right;  [place] 00:46.1 AP% 12.9 CDE 5.97 Fluid pack lot # [id number]  . COLONOSCOPY WITH PROPOFOL N/A 04/24/2016   Procedure: COLONOSCOPY WITH PROPOFOL;  Surgeon: Manya Silvas, MD;  Location: [facility] ENDOSCOPY;  Service: Endoscopy;  Laterality: N/A;  . DILATION AND CURETTAGE OF UTERUS  2002  . JOINT REPLACEMENT Right   . 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 THYROIDECTOMY  2010  . TONSILLECTOMY    . TONSILLECTOMY AND ADENOIDECTOMY  1950  . TOTAL KNEE ARTHROPLASTY  2015   right knee   Family History  Problem Relation Age of Onset  . Stroke Father   . Breast cancer Neg Hx  Social History   Socioeconomic History  . Marital status: Widowed    Spouse name: Not on file  . Number of children: 4  . Years of education: Not on file  . Highest education level: 12th grade  Occupational History  . Occupation: retired  Scientific laboratory technician  . Financial resource strain: Not hard at all  . Food insecurity:    Worry: Never true    Inability: Not on file  . Transportation needs:    Medical: No    Non-medical: No  Tobacco Use  . Smoking status: Former Smoker    Packs/day: 1.00    Years: 25.00    Pack years: 25.00    Types: Cigarettes    Last attempt to quit: 12/15/1995    Years since quitting: 22.3    . Smokeless tobacco: Never Used  . Tobacco comment: quit in 1990's  Substance and Sexual Activity  . Alcohol use: Yes    Alcohol/week: 0.0 oz    Comment: occasionally - glass of wine  . Drug use: No  . Sexual activity: Not on file  Lifestyle  . Physical activity:    Days per week: Not on file    Minutes per session: Not on file  . Stress: Not at all  Relationships  . Social connections:    Talks on phone: Not on file    Gets together: Not on file    Attends religious service: Not on file    Active member of club or organization: Not on file    Attends meetings of clubs or organizations: Not on file    Relationship status: Not on file  Other Topics Concern  . Not on file  Social History Narrative  . Not on file    Outpatient Encounter Medications as of 04/19/2018  Medication Sig  . ALPRAZolam (XANAX) 0.5 MG tablet Take 0.5-1 tablets (0.25-0.5 mg total) by mouth at bedtime as needed. (Patient taking differently: Take 0.25 mg by mouth at bedtime as needed for sleep. )  . aspirin 81 MG tablet Take 81 mg by mouth at bedtime.   . benazepril (LOTENSIN) 40 MG tablet Take 1 tablet (40 mg total) by mouth daily.  . Biotin 2500 MCG CAPS Take 2,500 mcg by mouth daily.  . Calcium Carbonate-Vitamin D 600-200 MG-UNIT CAPS Take 1 tablet by mouth daily with supper.   . hydrochlorothiazide (MICROZIDE) 12.5 MG capsule Take 1 capsule (12.5 mg total) by mouth daily.  [name] ibuprofen (ADVIL,MOTRIN) 200 MG tablet Take 400 mg by mouth daily as needed for headache or moderate pain.  [name] levothyroxine (SYNTHROID, LEVOTHROID) 100 MCG tablet Take 1 tablet (100 mcg total) by mouth daily before breakfast.  . metFORMIN (GLUCOPHAGE) 500 MG tablet TAKE ONE TABLET BY MOUTH DAILY WITH BREAKFAST  . MULTIPLE VITAMIN PO Take 1 tablet by mouth daily.   . Omega-3 Fatty Acids (FISH OIL ULTRA) 1400 MG CAPS Take 1,400 mg by mouth daily.  [name] oxybutynin (DITROPAN) 5 MG tablet Take 1 tablet (5 mg total) by mouth daily. (Patient  taking differently: Take 5 mg by mouth daily with breakfast. )  . pravastatin (PRAVACHOL) 10 MG tablet Take 1 tablet (10 mg total) by mouth daily. (Patient taking differently: Take 10 mg by mouth at bedtime. )  . ranitidine (ZANTAC) 150 MG tablet Take 150 mg by mouth daily as needed for heartburn.  . sulfamethoxazole-trimethoprim (BACTRIM DS,SEPTRA DS) 800-160 MG tablet Take 1 tablet by mouth 2 (two) times daily. (Patient not taking: Reported on 04/19/2018)  No facility-administered encounter medications on file as of 04/19/2018.     Activities of Daily Living In your present state of health, do you have any difficulty performing the following activities: 04/19/2018  Hearing? N  Vision? N  Difficulty concentrating or making decisions? N  Walking or climbing stairs? N  Dressing or bathing? N  Doing errands, shopping? N  Preparing Food and eating ? N  Using the Toilet? N  In the past six months, have you accidently leaked urine? N  Do you have problems with loss of bowel control? N  Managing your Medications? N  Managing your Finances? N  Housekeeping or managing your Housekeeping? N  Some recent data might be hidden    Patient Care Team: Mar Daring, PA-C as PCP - General (Family Medicine) Birder Robson, MD as Referring Physician (Ophthalmology) Ralene Bathe, MD as Consulting Physician (Dermatology) Marry Guan Laurice Record, MD as Consulting Physician (Orthopedic Surgery)    Assessment:   This is a routine wellness examination for Wendy Barnes.  Exercise Activities and Dietary recommendations Current Exercise Habits: Home exercise routine, Type of exercise: walking;treadmill, Time (Minutes): 15(to 20 minutes), Frequency (Times/Week): 3, Weekly Exercise (Minutes/Week): 45, Intensity: Mild  Goals    . DIET - REDUCE FAT INTAKE     Recommend cutting out all fried foods to help aid in weight loss.        Fall Risk Fall Risk  04/19/2018 04/17/2017 04/16/2016 07/16/2015  Falls in the past  year? No No No No   Is the patient's home free of loose throw rugs in walkways, pet beds, electrical cords, etc?   yes      Grab bars in the bathroom? yes      Handrails on the stairs?   no      Adequate lighting?   yes  Timed Get Up and Go performed: N/A  Depression Screen PHQ 2/9 Scores 04/19/2018 04/17/2017 04/17/2017 04/16/2016  PHQ - 2 Score 0 1 1 0  PHQ- 9 Score - 3 - -     Cognitive Function: Pt declined screening today.      6CIT Screen 04/17/2017  What Year? 0 points  What month? 0 points  What time? 0 points  Count back from 20 0 points  Months in reverse 0 points  Repeat phrase 4 points  Total Score 4    Immunization History  Administered Date(s) Administered  . Influenza, High Dose Seasonal PF 10/16/2015, 08/20/2016, 09/28/2017  . Pneumococcal Conjugate-13 10/30/2014  . Pneumococcal Polysaccharide-23 01/02/2010  . Zoster 02/08/2009    Qualifies for Shingles Vaccine? Due for Shingles vaccine. Declined my offer to administer today. Education has been provided regarding the importance of this vaccine. Pt has been advised to call her insurance company to determine her out of pocket expense. Advised she may also receive this vaccine at her local pharmacy or Health Dept. Verbalized acceptance and understanding.  Screening Tests Health Maintenance  Topic Date Due  . TETANUS/TDAP  09/03/1962  . HEMOGLOBIN A1C  03/29/2018  . INFLUENZA VACCINE  07/15/2018  . FOOT EXAM  09/28/2018  . OPHTHALMOLOGY EXAM  12/29/2018  . MAMMOGRAM  05/21/2019  . COLONOSCOPY  04/24/2026  . DEXA SCAN  Completed  . PNA vac Low Risk Adult  Completed    Cancer Screenings: Lung: Low Dose CT Chest recommended if Age 33-80 years, 30 pack-year currently smoking OR have quit w/in 15years. Patient does not qualify. Breast:  Up to date on Mammogram? Yes   Up to  date of Bone Density/Dexa? Yes Colorectal: Up to date  Additional Screenings:  Hepatitis C Screening: N/A     Plan:  I have personally  reviewed and addressed the Medicare Annual Wellness questionnaire and have noted the following in the patient's chart:  A. Medical and social history B. Use of alcohol, tobacco or illicit drugs  C. Current medications and supplements D. Functional ability and status E.  Nutritional status F.  Physical activity G. Advance directives H. List of other physicians I.  Hospitalizations, surgeries, and ER visits in previous 12 months J.  [place] such as hearing and vision if needed, cognitive and depression L. Referrals and appointments - none  In addition, I have reviewed and discussed with patient certain preventive protocols, quality metrics, and best practice recommendations. A written personalized care plan for preventive services as well as general preventive health recommendations were provided to patient.  See attached scanned questionnaire for additional information.   Signed,  Fabio Neighbors, LPN Nurse Health Advisor   Nurse Recommendations: Pt needs her Hgb A1c checked today. Pt declined the tetanus vaccine.

## 2018-04-19 NOTE — Patient Instructions (Signed)
Health Maintenance for Postmenopausal Women Menopause is a normal process in which your reproductive ability comes to an end. This process happens gradually over a span of months to years, usually between the ages of 22 and 9. Menopause is complete when you have missed 12 consecutive menstrual periods. It is important to talk with your health care provider about some of the most common conditions that affect postmenopausal women, such as heart disease, cancer, and bone loss (osteoporosis). Adopting a healthy lifestyle and getting preventive care can help to promote your health and wellness. Those actions can also lower your chances of developing some of these common conditions. What should I know about menopause? During menopause, you may experience a number of symptoms, such as:  Moderate-to-severe hot flashes.  Night sweats.  Decrease in sex drive.  Mood swings.  Headaches.  Tiredness.  Irritability.  Memory problems.  Insomnia.  Choosing to treat or not to treat menopausal changes is an individual decision that you make with your health care provider. What should I know about hormone replacement therapy and supplements? Hormone therapy products are effective for treating symptoms that are associated with menopause, such as hot flashes and night sweats. Hormone replacement carries certain risks, especially as you become older. If you are thinking about using estrogen or estrogen with progestin treatments, discuss the benefits and risks with your health care provider. What should I know about heart disease and stroke? Heart disease, heart attack, and stroke become more likely as you age. This may be due, in part, to the hormonal changes that your body experiences during menopause. These can affect how your body processes dietary fats, triglycerides, and cholesterol. Heart attack and stroke are both medical emergencies. There are many things that you can do to help prevent heart disease  and stroke:  Have your blood pressure checked at least every 1-2 years. High blood pressure causes heart disease and increases the risk of stroke.  If you are 53-22 years old, ask your health care provider if you should take aspirin to prevent a heart attack or a stroke.  Do not use any tobacco products, including cigarettes, chewing tobacco, or electronic cigarettes. If you need help quitting, ask your health care provider.  It is important to eat a healthy diet and maintain a healthy weight. ? Be sure to include plenty of vegetables, fruits, low-fat dairy products, and lean protein. ? Avoid eating foods that are high in solid fats, added sugars, or salt (sodium).  Get regular exercise. This is one of the most important things that you can do for your health. ? Try to exercise for at least 150 minutes each week. The type of exercise that you do should increase your heart rate and make you sweat. This is known as moderate-intensity exercise. ? Try to do strengthening exercises at least twice each week. Do these in addition to the moderate-intensity exercise.  Know your numbers.Ask your health care provider to check your cholesterol and your blood glucose. Continue to have your blood tested as directed by your health care provider.  What should I know about cancer screening? There are several types of cancer. Take the following steps to reduce your risk and to catch any cancer development as early as possible. Breast Cancer  Practice breast self-awareness. ? This means understanding how your breasts normally appear and feel. ? It also means doing regular breast self-exams. Let your health care provider know about any changes, no matter how small.  If you are 40  or older, have a clinician do a breast exam (clinical breast exam or CBE) every year. Depending on your age, family history, and medical history, it may be recommended that you also have a yearly breast X-ray (mammogram).  If you  have a family history of breast cancer, talk with your health care provider about genetic screening.  If you are at high risk for breast cancer, talk with your health care provider about having an MRI and a mammogram every year.  Breast cancer (BRCA) gene test is recommended for women who have family members with BRCA-related cancers. Results of the assessment will determine the need for genetic counseling and BRCA1 and for BRCA2 testing. BRCA-related cancers include these types: ? Breast. This occurs in males or females. ? Ovarian. ? Tubal. This may also be called fallopian tube cancer. ? Cancer of the abdominal or pelvic lining (peritoneal cancer). ? Prostate. ? Pancreatic.  Cervical, Uterine, and Ovarian Cancer Your health care provider may recommend that you be screened regularly for cancer of the pelvic organs. These include your ovaries, uterus, and vagina. This screening involves a pelvic exam, which includes checking for microscopic changes to the surface of your cervix (Pap test).  For women ages 21-65, health care providers may recommend a pelvic exam and a Pap test every three years. For women ages 79-65, they may recommend the Pap test and pelvic exam, combined with testing for human papilloma virus (HPV), every five years. Some types of HPV increase your risk of cervical cancer. Testing for HPV may also be done on women of any age who have unclear Pap test results.  Other health care providers may not recommend any screening for nonpregnant women who are considered low risk for pelvic cancer and have no symptoms. Ask your health care provider if a screening pelvic exam is right for you.  If you have had past treatment for cervical cancer or a condition that could lead to cancer, you need Pap tests and screening for cancer for at least 20 years after your treatment. If Pap tests have been discontinued for you, your risk factors (such as having a new sexual partner) need to be  reassessed to determine if you should start having screenings again. Some women have medical problems that increase the chance of getting cervical cancer. In these cases, your health care provider may recommend that you have screening and Pap tests more often.  If you have a family history of uterine cancer or ovarian cancer, talk with your health care provider about genetic screening.  If you have vaginal bleeding after reaching menopause, tell your health care provider.  There are currently no reliable tests available to screen for ovarian cancer.  Lung Cancer Lung cancer screening is recommended for adults 69-62 years old who are at high risk for lung cancer because of a history of smoking. A yearly low-dose CT scan of the lungs is recommended if you:  Currently smoke.  Have a history of at least 30 pack-years of smoking and you currently smoke or have quit within the past 15 years. A pack-year is smoking an average of one pack of cigarettes per day for one year.  Yearly screening should:  Continue until it has been 15 years since you quit.  Stop if you develop a health problem that would prevent you from having lung cancer treatment.  Colorectal Cancer  This type of cancer can be detected and can often be prevented.  Routine colorectal cancer screening usually begins at  age 42 and continues through age 45.  If you have risk factors for colon cancer, your health care provider may recommend that you be screened at an earlier age.  If you have a family history of colorectal cancer, talk with your health care provider about genetic screening.  Your health care provider may also recommend using home test kits to check for hidden blood in your stool.  A small camera at the end of a tube can be used to examine your colon directly (sigmoidoscopy or colonoscopy). This is done to check for the earliest forms of colorectal cancer.  Direct examination of the colon should be repeated every  5-10 years until age 71. However, if early forms of precancerous polyps or small growths are found or if you have a family history or genetic risk for colorectal cancer, you may need to be screened more often.  Skin Cancer  Check your skin from head to toe regularly.  Monitor any moles. Be sure to tell your health care provider: ? About any new moles or changes in moles, especially if there is a change in a mole's shape or color. ? If you have a mole that is larger than the size of a pencil eraser.  If any of your family members has a history of skin cancer, especially at a young age, talk with your health care provider about genetic screening.  Always use sunscreen. Apply sunscreen liberally and repeatedly throughout the day.  Whenever you are outside, protect yourself by wearing long sleeves, pants, a wide-brimmed hat, and sunglasses.  What should I know about osteoporosis? Osteoporosis is a condition in which bone destruction happens more quickly than new bone creation. After menopause, you may be at an increased risk for osteoporosis. To help prevent osteoporosis or the bone fractures that can happen because of osteoporosis, the following is recommended:  If you are 46-71 years old, get at least 1,000 mg of calcium and at least 600 mg of vitamin D per day.  If you are older than age 55 but younger than age 65, get at least 1,200 mg of calcium and at least 600 mg of vitamin D per day.  If you are older than age 54, get at least 1,200 mg of calcium and at least 800 mg of vitamin D per day.  Smoking and excessive alcohol intake increase the risk of osteoporosis. Eat foods that are rich in calcium and vitamin D, and do weight-bearing exercises several times each week as directed by your health care provider. What should I know about how menopause affects my mental health? Depression may occur at any age, but it is more common as you become older. Common symptoms of depression  include:  Low or sad mood.  Changes in sleep patterns.  Changes in appetite or eating patterns.  Feeling an overall lack of motivation or enjoyment of activities that you previously enjoyed.  Frequent crying spells.  Talk with your health care provider if you think that you are experiencing depression. What should I know about immunizations? It is important that you get and maintain your immunizations. These include:  Tetanus, diphtheria, and pertussis (Tdap) booster vaccine.  Influenza every year before the flu season begins.  Pneumonia vaccine.  Shingles vaccine.  Your health care provider may also recommend other immunizations. This information is not intended to replace advice given to you by your health care provider. Make sure you discuss any questions you have with your health care provider. Document Released: 01/23/2006  Document Revised: 06/20/2016 Document Reviewed: 09/04/2015 Elsevier Interactive Patient Education  2018 [employer]  

## 2018-04-19 NOTE — Patient Instructions (Signed)
Ms. Wendy Barnes , Thank you for taking time to come for your Medicare Wellness Visit. I appreciate your ongoing commitment to your health goals. Please review the following plan we discussed and let me know if I can assist you in the future.   Screening recommendations/referrals: Colonoscopy: Up to date Mammogram: Up to date Bone Density: Up to date Recommended yearly ophthalmology/optometry visit for glaucoma screening and checkup Recommended yearly dental visit for hygiene and checkup  Vaccinations: Influenza vaccine: Up to date Pneumococcal vaccine: Up to date Tdap vaccine: Pt declines today.  Shingles vaccine: Pt declines today.     Advanced directives: Advance directive discussed with you today. Even though you declined this today please call our office should you change your mind and we can give you the proper paperwork for you to fill out.  Conditions/risks identified: Obesity- recommend cutting out all fried foods to help aid in weight loss.   Next appointment: 10:00 AM today with Fenton Malling.    Preventive Care 34 Years and Older, Female Preventive care refers to lifestyle choices and visits with your health care provider that can promote health and wellness. What does preventive care include?  A yearly physical exam. This is also called an annual well check.  Dental exams once or twice a year.  Routine eye exams. Ask your health care provider how often you should have your eyes checked.  Personal lifestyle choices, including:  Daily care of your teeth and gums.  Regular physical activity.  Eating a healthy diet.  Avoiding tobacco and drug use.  Limiting alcohol use.  Practicing safe sex.  Taking low-dose aspirin every day.  Taking vitamin and mineral supplements as recommended by your health care provider. What happens during an annual well check? The services and screenings done by your health care provider during your annual well check will depend on  your age, overall health, lifestyle risk factors, and family history of disease. Counseling  Your health care provider may ask you questions about your:  Alcohol use.  Tobacco use.  Drug use.  Emotional well-being.  Home and relationship well-being.  Sexual activity.  Eating habits.  History of falls.  Memory and ability to understand (cognition).  Work and work Statistician.  Reproductive health. Screening  You may have the following tests or measurements:  Height, weight, and BMI.  Blood pressure.  Lipid and cholesterol levels. These may be checked every 5 years, or more frequently if you are over 60 years old.  Skin check.  Lung cancer screening. You may have this screening every year starting at age 7 if you have a 30-pack-year history of smoking and currently smoke or have quit within the past 15 years.  Fecal occult blood test (FOBT) of the stool. You may have this test every year starting at age 66.  Flexible sigmoidoscopy or colonoscopy. You may have a sigmoidoscopy every 5 years or a colonoscopy every 10 years starting at age 77.  Hepatitis C blood test.  Hepatitis B blood test.  Sexually transmitted disease (STD) testing.  Diabetes screening. This is done by checking your blood sugar (glucose) after you have not eaten for a while (fasting). You may have this done every 1-3 years.  Bone density scan. This is done to screen for osteoporosis. You may have this done starting at age 7.  Mammogram. This may be done every 1-2 years. Talk to your health care provider about how often you should have regular mammograms. Talk with your health care provider about  your test results, treatment options, and if necessary, the need for more tests. Vaccines  Your health care provider may recommend certain vaccines, such as:  Influenza vaccine. This is recommended every year.  Tetanus, diphtheria, and acellular pertussis (Tdap, Td) vaccine. You may need a Td booster  every 10 years.  Zoster vaccine. You may need this after age 18.  Pneumococcal 13-valent conjugate (PCV13) vaccine. One dose is recommended after age 28.  Pneumococcal polysaccharide (PPSV23) vaccine. One dose is recommended after age 62. Talk to your health care provider about which screenings and vaccines you need and how often you need them. This information is not intended to replace advice given to you by your health care provider. Make sure you discuss any questions you have with your health care provider. Document Released: 12/28/2015 Document Revised: 08/20/2016 Document Reviewed: 10/02/2015 Elsevier Interactive Patient Education  2017 [place] Prevention in the Home Falls can cause injuries. They can happen to people of all ages. There are many things you can do to make your home safe and to help prevent falls. What can I do on the outside of my home?  Regularly fix the edges of walkways and driveways and fix any cracks.  Remove anything that might make you trip as you walk through a door, such as a raised step or threshold.  Trim any bushes or trees on the path to your home.  Use bright outdoor lighting.  Clear any walking paths of anything that might make someone trip, such as rocks or tools.  Regularly check to see if handrails are loose or broken. Make sure that both sides of any steps have handrails.  Any raised decks and porches should have guardrails on the edges.  Have any leaves, snow, or ice cleared regularly.  Use sand or salt on walking paths during winter.  Clean up any spills in your garage right away. This includes oil or grease spills. What can I do in the bathroom?  Use night lights.  Install grab bars by the toilet and in the tub and shower. Do not use towel bars as grab bars.  Use non-skid mats or decals in the tub or shower.  If you need to sit down in the shower, use a plastic, non-slip stool.  Keep the floor dry. Clean up any  water that spills on the floor as soon as it happens.  Remove soap buildup in the tub or shower regularly.  Attach bath mats securely with double-sided non-slip rug tape.  Do not have throw rugs and other things on the floor that can make you trip. What can I do in the bedroom?  Use night lights.  Make sure that you have a light by your bed that is easy to reach.  Do not use any sheets or blankets that are too big for your bed. They should not hang down onto the floor.  Have a firm chair that has side arms. You can use this for support while you get dressed.  Do not have throw rugs and other things on the floor that can make you trip. What can I do in the kitchen?  Clean up any spills right away.  Avoid walking on wet floors.  Keep items that you use a lot in easy-to-reach places.  If you need to reach something above you, use a strong step stool that has a grab bar.  Keep electrical cords out of the way.  Do not use floor polish or wax  that makes floors slippery. If you must use wax, use non-skid floor wax.  Do not have throw rugs and other things on the floor that can make you trip. What can I do with my stairs?  Do not leave any items on the stairs.  Make sure that there are handrails on both sides of the stairs and use them. Fix handrails that are broken or loose. Make sure that handrails are as long as the stairways.  Check any carpeting to make sure that it is firmly attached to the stairs. Fix any carpet that is loose or worn.  Avoid having throw rugs at the top or bottom of the stairs. If you do have throw rugs, attach them to the floor with carpet tape.  Make sure that you have a light switch at the top of the stairs and the bottom of the stairs. If you do not have them, ask someone to add them for you. What else can I do to help prevent falls?  Wear shoes that:  Do not have high heels.  Have rubber bottoms.  Are comfortable and fit you well.  Are closed  at the toe. Do not wear sandals.  If you use a stepladder:  Make sure that it is fully opened. Do not climb a closed stepladder.  Make sure that both sides of the stepladder are locked into place.  Ask someone to hold it for you, if possible.  Clearly mark and make sure that you can see:  Any grab bars or handrails.  First and last steps.  Where the edge of each step is.  Use tools that help you move around (mobility aids) if they are needed. These include:  Canes.  Walkers.  Scooters.  Crutches.  Turn on the lights when you go into a dark area. Replace any light bulbs as soon as they burn out.  Set up your furniture so you have a clear path. Avoid moving your furniture around.  If any of your floors are uneven, fix them.  If there are any pets around you, be aware of where they are.  Review your medicines with your doctor. Some medicines can make you feel dizzy. This can increase your chance of falling. Ask your doctor what other things that you can do to help prevent falls. This information is not intended to replace advice given to you by your health care provider. Make sure you discuss any questions you have with your health care provider. Document Released: 09/27/2009 Document Revised: 05/08/2016 Document Reviewed: 01/05/2015 Elsevier Interactive Patient Education  2017 [employer].

## 2018-04-27 DIAGNOSIS — E119 Type 2 diabetes mellitus without complications: Secondary | ICD-10-CM | POA: Diagnosis not present

## 2018-04-27 DIAGNOSIS — E7800 Pure hypercholesterolemia, unspecified: Secondary | ICD-10-CM | POA: Diagnosis not present

## 2018-04-28 LAB — LIPID PANEL
CHOL/HDL RATIO: 3.9 ratio (ref 0.0–4.4)
Cholesterol, Total: 201 mg/dL — ABNORMAL HIGH (ref 100–199)
HDL: 52 mg/dL (ref >39)
LDL Calculated: 126 mg/dL — ABNORMAL HIGH (ref 0–99)
Triglycerides: 115 mg/dL (ref 0–149)
VLDL Cholesterol Cal: 23 mg/dL (ref 5–40)

## 2018-04-28 LAB — HEMOGLOBIN A1C
ESTIMATED AVERAGE GLUCOSE: 120 mg/dL
Hgb A1c MFr Bld: 5.8 % — ABNORMAL HIGH (ref 4.8–5.6)

## 2018-05-11 ENCOUNTER — Other Ambulatory Visit: Payer: Self-pay | Admitting: Physician Assistant

## 2018-05-11 DIAGNOSIS — E7800 Pure hypercholesterolemia, unspecified: Secondary | ICD-10-CM

## 2018-05-11 MED ORDER — PRAVASTATIN SODIUM 10 MG PO TABS: 10.0000 mg | ORAL_TABLET | Freq: Every day | ORAL | 3 refills | Status: DC

## 2018-05-11 NOTE — Telephone Encounter (Signed)
Pt needs a refill on her Pravastatin 10 mg  90 day supply  [employer]

## 2018-05-20 ENCOUNTER — Ambulatory Visit
Admission: RE | Admit: 2018-05-20 | Discharge: 2018-05-20 | Disposition: A | Discharge status: Home / Self Care | Payer: Medicare Other | Source: Ambulatory Visit | Attending: Family Medicine | Admitting: Family Medicine

## 2018-05-20 ENCOUNTER — Telehealth: Payer: Self-pay | Admitting: Physician Assistant

## 2018-05-20 ENCOUNTER — Encounter: Payer: Self-pay | Admitting: Family Medicine

## 2018-05-20 ENCOUNTER — Ambulatory Visit (INDEPENDENT_AMBULATORY_CARE_PROVIDER_SITE_OTHER): Payer: Medicare Other | Admitting: Family Medicine

## 2018-05-20 VITALS — BP 138/68 | HR 63 | Temp 98.2°F | Resp 16 | Wt 202.0 lb

## 2018-05-20 DIAGNOSIS — R0989 Other specified symptoms and signs involving the circulatory and respiratory systems: Secondary | ICD-10-CM

## 2018-05-20 DIAGNOSIS — R05 Cough: Secondary | ICD-10-CM | POA: Insufficient documentation

## 2018-05-20 DIAGNOSIS — R059 Cough, unspecified: Secondary | ICD-10-CM

## 2018-05-20 MED ORDER — BENZONATATE 100 MG PO CAPS: 100.0000 mg | ORAL_CAPSULE | Freq: Three times a day (TID) | ORAL | 0 refills | Status: DC | PRN

## 2018-05-20 NOTE — Telephone Encounter (Signed)
Pt called back to give a new number [phone].  Wendy Barnes

## 2018-05-20 NOTE — Telephone Encounter (Signed)
Pt advised.

## 2018-05-20 NOTE — Progress Notes (Signed)
Patient: Wendy Barnes Female    DOB: 04-08-1941   77 y.o.   MRN: [id number] Visit Date: 05/20/2018  Today's Provider: Lavon Paganini, MD   I, Martha Clan, CMA, am acting as scribe for Lavon Paganini, MD.  Chief Complaint  Patient presents with  . Cough   Subjective:    Cough  This is a new problem. Episode onset: x 1 week. Progression since onset: was improving, but is now worsening. The cough is productive of sputum. Associated symptoms include nasal congestion, postnasal drip and rhinorrhea (at onset). Pertinent negatives include no chest pain, chills, ear congestion, ear pain, fever, headaches, heartburn, hemoptysis, sore throat, shortness of breath, sweats, weight loss or wheezing. The symptoms are aggravated by lying down. Treatments tried: Coricidin, an expired Rx of Hycodan, and Tessalon Perles. The treatment provided moderate relief. Her past medical history is significant for environmental allergies. There is no history of asthma, COPD or pneumonia.   Thought she was getting better and then cough continued.  Granddaughter was sick with similar symptoms.    Allergies  Allergen Reactions  . Amoxicillin Hives and Rash    Has patient had a PCN reaction causing immediate rash, facial/tongue/throat swelling, SOB or lightheadedness with hypotension: Yes Has patient had a PCN reaction causing severe rash involving mucus membranes or skin necrosis: Yes Has patient had a PCN reaction that required hospitalization: No Has patient had a PCN reaction occurring within the last 10 years: Yes If all of the above answers are "NO", then may proceed with Cephalosporin use.   [name] Penicillins Hives and Rash     Current Outpatient Medications:  .  ALPRAZolam (XANAX) 0.5 MG tablet, Take 0.5 tablets (0.25 mg total) by mouth at bedtime as needed for sleep., Disp: 45 tablet, Rfl: 1 .  aspirin 81 MG tablet, Take 81 mg by mouth at bedtime. , Disp: , Rfl:  .  benazepril (LOTENSIN) 40 MG  tablet, Take 1 tablet (40 mg total) by mouth daily., Disp: 90 tablet, Rfl: 3 .  Biotin 2500 MCG CAPS, Take 2,500 mcg by mouth daily., Disp: , Rfl:  .  Calcium Carbonate-Vitamin D 600-200 MG-UNIT CAPS, Take 1 tablet by mouth daily with supper. , Disp: , Rfl:  .  hydrochlorothiazide (MICROZIDE) 12.5 MG capsule, Take 1 capsule (12.5 mg total) by mouth daily., Disp: 90 capsule, Rfl: 1 .  ibuprofen (ADVIL,MOTRIN) 200 MG tablet, Take 400 mg by mouth daily as needed for headache or moderate pain., Disp: , Rfl:  .  levothyroxine (SYNTHROID, LEVOTHROID) 100 MCG tablet, Take 1 tablet (100 mcg total) by mouth daily before breakfast., Disp: 90 tablet, Rfl: 3 .  metFORMIN (GLUCOPHAGE) 500 MG tablet, TAKE ONE TABLET BY MOUTH DAILY WITH BREAKFAST, Disp: 90 tablet, Rfl: 1 .  MULTIPLE VITAMIN PO, Take 1 tablet by mouth daily. , Disp: , Rfl:  .  Omega-3 Fatty Acids (FISH OIL ULTRA) 1400 MG CAPS, Take 1,400 mg by mouth daily., Disp: , Rfl:  .  oxybutynin (DITROPAN) 5 MG tablet, Take 1 tablet (5 mg total) by mouth daily. (Patient taking differently: Take 5 mg by mouth daily with breakfast. ), Disp: 90 tablet, Rfl: 3 .  pravastatin (PRAVACHOL) 10 MG tablet, Take 1 tablet (10 mg total) by mouth at bedtime., Disp: 90 tablet, Rfl: 3 .  ranitidine (ZANTAC) 150 MG tablet, Take 150 mg by mouth daily as needed for heartburn., Disp: , Rfl:   Review of Systems  Constitutional: Negative for chills, fever and weight loss.  HENT: Positive for postnasal drip and rhinorrhea (at onset). Negative for ear pain and sore throat.   Respiratory: Positive for cough. Negative for hemoptysis, shortness of breath and wheezing.   Cardiovascular: Negative for chest pain.  Gastrointestinal: Negative for heartburn.  Allergic/Immunologic: Positive for environmental allergies.  Neurological: Negative for headaches.    Social History   Tobacco Use  . Smoking status: Former Smoker    Packs/day: 1.00    Years: 25.00    Pack years: 25.00     Types: Cigarettes    Last attempt to quit: 12/15/1995    Years since quitting: 22.4  . Smokeless tobacco: Never Used  . Tobacco comment: quit in 1990's  Substance Use Topics  . Alcohol use: Yes    Alcohol/week: 0.0 oz    Comment: occasionally - glass of wine   Objective:   BP 138/68 (BP Location: Left Arm, Patient Position: Sitting, Cuff Size: Large)   Pulse 63   Temp 98.2 F (36.8 C) (Oral)   Resp 16   Wt 202 lb (91.6 kg)   SpO2 99%   BMI 32.60 kg/m  Vitals:   05/20/18 1033  BP: 138/68  Pulse: 63  Resp: 16  Temp: 98.2 F (36.8 C)  TempSrc: Oral  SpO2: 99%  Weight: 202 lb (91.6 kg)     Physical Exam  Constitutional: She is oriented to person, place, and time. She appears well-developed and well-nourished. No distress.  HENT:  Head: Normocephalic and atraumatic.  Right Ear: Tympanic membrane, external ear and ear canal normal.  Left Ear: Tympanic membrane, external ear and ear canal normal.  Nose: Mucosal edema and rhinorrhea present. Right sinus exhibits no maxillary sinus tenderness and no frontal sinus tenderness. Left sinus exhibits no maxillary sinus tenderness and no frontal sinus tenderness.  Mouth/Throat: Uvula is midline, oropharynx is clear and moist and mucous membranes are normal.  Eyes: Pupils are equal, round, and reactive to light. Conjunctivae are normal. No scleral icterus.  Neck: Neck supple. No thyromegaly present.  Cardiovascular: Normal rate, regular rhythm, normal heart sounds and intact distal pulses.  No murmur heard. Pulmonary/Chest: Effort normal. No respiratory distress.  Coarse breath sounds R>L  Musculoskeletal: She exhibits no edema or deformity.  Lymphadenopathy:    She has no cervical adenopathy.  Neurological: She is alert and oriented to person, place, and time.  Skin: Skin is warm and dry. Capillary refill takes less than 2 seconds. No rash noted.  Psychiatric: She has a normal mood and affect. Her behavior is normal.  Vitals  reviewed.      Assessment & Plan:   1. Cough 2. Abnormal lung sounds - suspect viral URI vs bronchitis, but given abnormal lung sounds, obtain CXR to r/o CAP -Discussed symptomatic treatments, natural course, and return precautions -We will treat with antibiotic if there is any infiltrate on chest x-ray -If no infiltrate, could consider prednisone treatment for possible bronchitis and abnormal lung exam -Tessalon Perles prescribed disease of helped with patient's cough in the past - DG Chest 2 View; Future    Meds ordered this encounter  Medications  . benzonatate (TESSALON PERLES) 100 MG capsule    Sig: Take 1 capsule (100 mg total) by mouth 3 (three) times daily as needed for cough.    Dispense:  60 capsule    Refill:  0     Return if symptoms worsen or fail to improve.   The entirety of the information documented in the History of Present Illness, Review of  Systems and Physical Exam were personally obtained by me. Portions of this information were initially documented by Raquel Sarna Ratchford, CMA and reviewed by me for thoroughness and accuracy.    Virginia Crews, MD, MPH [facility] 05/20/2018 4:56 PM

## 2018-05-20 NOTE — Telephone Encounter (Signed)
Radiologist has not read the xray. Pt advised and states she would like to know the results prior to go to pharmacy, just in case she needs another prescription. Can you review image?

## 2018-05-20 NOTE — Telephone Encounter (Signed)
Pt in this am and seen Dr. Jacinto Reap.  She sent her for a chest Xray  Pt is wanting to know if we had results  Pt's call back [phone]  Thanks teri

## 2018-05-20 NOTE — Telephone Encounter (Signed)
I do not see any obvious pneumonia on CXR, but I am waiting for radiologist to read the Xray.  As discussed with patient, it may be tomorrow before this is done.  Will send new Rx at that point pending results  Madhuri Vacca, Dionne Bucy, MD, MPH [facility] 05/20/2018 3:09 PM

## 2018-05-20 NOTE — Patient Instructions (Signed)

## 2018-05-21 ENCOUNTER — Telehealth: Payer: Self-pay

## 2018-05-21 NOTE — Telephone Encounter (Signed)
-----   Message from Virginia Crews, MD sent at 05/20/2018  4:44 PM EDT ----- No pneumonia.  Negative Chest Xray.  No antibiotics needed. If patient desires, she can take prednisone for bronchitis and cough.  This may help decrease inflammation some.  If she does want this, can Rx Prednisone 20mg  pills - take 40mg  daily x5d.  #10 r0.  She can also just use Tessalon instead.  Virginia Crews, MD, MPH [facility] 05/20/2018 4:44 PM

## 2018-05-21 NOTE — Telephone Encounter (Signed)
Pt advised. Declines Prednisone for now, but states she will call back if sx do not improve on Tessalon perles.

## 2018-05-25 ENCOUNTER — Ambulatory Visit
Admission: RE | Admit: 2018-05-25 | Discharge: 2018-05-25 | Disposition: A | Discharge status: Home / Self Care | Payer: Medicare Other | Source: Ambulatory Visit | Attending: Physician Assistant | Admitting: Physician Assistant

## 2018-05-25 ENCOUNTER — Telehealth: Payer: Self-pay

## 2018-05-25 DIAGNOSIS — Z1231 Encounter for screening mammogram for malignant neoplasm of breast: Secondary | ICD-10-CM | POA: Diagnosis not present

## 2018-05-25 NOTE — Telephone Encounter (Signed)
LMTCB  Thanks,  -Joseline 

## 2018-05-25 NOTE — Telephone Encounter (Signed)
-----   Message from Mar Daring, [place] sent at 05/25/2018 12:05 PM EDT ----- Normal mammogram. Repeat screening in one year.

## 2018-05-27 NOTE — Telephone Encounter (Signed)
Patient advised as directed below.  Thanks,  -Joseline 

## 2018-06-05 ENCOUNTER — Other Ambulatory Visit: Payer: Self-pay | Admitting: Physician Assistant

## 2018-06-05 DIAGNOSIS — N3946 Mixed incontinence: Secondary | ICD-10-CM

## 2018-07-19 DIAGNOSIS — H43813 Vitreous degeneration, bilateral: Secondary | ICD-10-CM | POA: Diagnosis not present

## 2018-07-21 DIAGNOSIS — L219 Seborrheic dermatitis, unspecified: Secondary | ICD-10-CM | POA: Diagnosis not present

## 2018-07-21 DIAGNOSIS — B029 Zoster without complications: Secondary | ICD-10-CM | POA: Diagnosis not present

## 2018-09-06 DIAGNOSIS — L400 Psoriasis vulgaris: Secondary | ICD-10-CM | POA: Diagnosis not present

## 2018-09-28 ENCOUNTER — Other Ambulatory Visit: Payer: Self-pay | Admitting: Physician Assistant

## 2018-09-28 DIAGNOSIS — E119 Type 2 diabetes mellitus without complications: Secondary | ICD-10-CM

## 2018-10-16 ENCOUNTER — Ambulatory Visit (INDEPENDENT_AMBULATORY_CARE_PROVIDER_SITE_OTHER): Payer: Medicare Other

## 2018-10-16 DIAGNOSIS — Z23 Encounter for immunization: Secondary | ICD-10-CM | POA: Diagnosis not present

## 2018-10-26 ENCOUNTER — Other Ambulatory Visit: Payer: Self-pay | Admitting: Physician Assistant

## 2018-10-26 DIAGNOSIS — F5101 Primary insomnia: Secondary | ICD-10-CM

## 2018-10-26 NOTE — Telephone Encounter (Signed)
Pt needing a refill on:  ALPRAZolam Duanne Moron) 0.5 MG tablet  Please fill at:  [place], [place] [phone] (Phone) [phone] (Fax)    Thanks, [place]

## 2018-10-27 MED ORDER — ALPRAZOLAM 0.5 MG PO TABS
0.2500 mg | ORAL_TABLET | Freq: Every evening | ORAL | 1 refills | Status: DC | PRN
Start: 2018-10-27 — End: 2019-03-29

## 2018-10-27 NOTE — Telephone Encounter (Signed)
refilled 

## 2018-11-01 DIAGNOSIS — L400 Psoriasis vulgaris: Secondary | ICD-10-CM | POA: Diagnosis not present

## 2019-01-14 ENCOUNTER — Other Ambulatory Visit: Payer: Self-pay | Admitting: Physician Assistant

## 2019-01-14 DIAGNOSIS — N3946 Mixed incontinence: Secondary | ICD-10-CM

## 2019-02-22 ENCOUNTER — Other Ambulatory Visit: Payer: Self-pay | Admitting: Physician Assistant

## 2019-02-22 DIAGNOSIS — E039 Hypothyroidism, unspecified: Secondary | ICD-10-CM

## 2019-03-05 ENCOUNTER — Other Ambulatory Visit: Payer: Self-pay | Admitting: Physician Assistant

## 2019-03-05 DIAGNOSIS — I10 Essential (primary) hypertension: Secondary | ICD-10-CM

## 2019-03-10 ENCOUNTER — Other Ambulatory Visit: Payer: Self-pay | Admitting: Physician Assistant

## 2019-03-10 DIAGNOSIS — E039 Hypothyroidism, unspecified: Secondary | ICD-10-CM

## 2019-03-29 ENCOUNTER — Other Ambulatory Visit: Payer: Self-pay | Admitting: Physician Assistant

## 2019-03-29 DIAGNOSIS — F5101 Primary insomnia: Secondary | ICD-10-CM

## 2019-04-18 ENCOUNTER — Other Ambulatory Visit: Payer: Self-pay | Admitting: Physician Assistant

## 2019-04-18 DIAGNOSIS — Z1231 Encounter for screening mammogram for malignant neoplasm of breast: Secondary | ICD-10-CM

## 2019-04-21 ENCOUNTER — Ambulatory Visit: Payer: Self-pay

## 2019-04-28 ENCOUNTER — Other Ambulatory Visit: Payer: Self-pay | Admitting: Physician Assistant

## 2019-04-28 DIAGNOSIS — E7800 Pure hypercholesterolemia, unspecified: Secondary | ICD-10-CM

## 2019-05-16 ENCOUNTER — Telehealth: Payer: Self-pay

## 2019-05-16 DIAGNOSIS — N3000 Acute cystitis without hematuria: Secondary | ICD-10-CM

## 2019-05-16 MED ORDER — CEPHALEXIN 500 MG PO CAPS: 500.0000 mg | ORAL_CAPSULE | Freq: Two times a day (BID) | ORAL | 0 refills | Status: DC

## 2019-05-16 NOTE — Telephone Encounter (Signed)
Pt called and advised that she has been up all night and believes she has a UTI.  Having a lot of pressure and urine is a little dark and has a odor this morning.  Pt is asking if something can be called in to [facility] since there is no appts today.  She can come in tomorrow if you need her to also but needs something to start on today.  Call back # [phone].

## 2019-05-16 NOTE — Telephone Encounter (Signed)
Left detailed message on pt's vm

## 2019-05-16 NOTE — Telephone Encounter (Signed)
Sent in Keflex.

## 2019-05-26 ENCOUNTER — Other Ambulatory Visit: Payer: Self-pay

## 2019-05-26 ENCOUNTER — Encounter: Payer: Self-pay | Admitting: Physician Assistant

## 2019-05-26 ENCOUNTER — Ambulatory Visit (INDEPENDENT_AMBULATORY_CARE_PROVIDER_SITE_OTHER): Payer: Medicare Other | Admitting: Physician Assistant

## 2019-05-26 VITALS — BP 158/84 | HR 78 | Temp 97.9°F | Resp 16 | Wt 208.0 lb

## 2019-05-26 DIAGNOSIS — N3000 Acute cystitis without hematuria: Secondary | ICD-10-CM

## 2019-05-26 LAB — POCT URINALYSIS DIPSTICK
Appearance: NORMAL
Bilirubin, UA: NEGATIVE
Glucose, UA: NEGATIVE
Ketones, UA: NEGATIVE
Nitrite, UA: NEGATIVE
Odor: NORMAL
Protein, UA: NEGATIVE
Spec Grav, UA: <=1.005 — AB (ref 1.010–1.025)
Urobilinogen, UA: 0.2 E.U./dL
pH, UA: 7.5 (ref 5.0–8.0)

## 2019-05-26 MED ORDER — SULFAMETHOXAZOLE-TRIMETHOPRIM 800-160 MG PO TABS: 1.0000 | ORAL_TABLET | Freq: Two times a day (BID) | ORAL | 0 refills | Status: DC

## 2019-05-26 NOTE — Patient Instructions (Signed)
Urinary Tract Infection, Adult A urinary tract infection (UTI) is an infection of any part of the urinary tract. The urinary tract includes:  The kidneys.  The ureters.  The bladder.  The urethra. These organs make, store, and get rid of pee (urine) in the body. What are the causes? This is caused by germs (bacteria) in your genital area. These germs grow and cause swelling (inflammation) of your urinary tract. What increases the risk? You are more likely to develop this condition if:  You have a small, thin tube (catheter) to drain pee.  You cannot control when you pee or poop (incontinence).  You are female, and: ? You use these methods to prevent pregnancy: ? A medicine that kills sperm (spermicide). ? A device that blocks sperm (diaphragm). ? You have low levels of a female hormone (estrogen). ? You are pregnant.  You have genes that add to your risk.  You are sexually active.  You take antibiotic medicines.  You have trouble peeing because of: ? A prostate that is bigger than normal, if you are female. ? A blockage in the part of your body that drains pee from the bladder (urethra). ? A kidney stone. ? A nerve condition that affects your bladder (neurogenic bladder). ? Not getting enough to drink. ? Not peeing often enough.  You have other conditions, such as: ? Diabetes. ? A weak disease-fighting system (immune system). ? Sickle cell disease. ? Gout. ? Injury of the spine. What are the signs or symptoms? Symptoms of this condition include:  Needing to pee right away (urgently).  Peeing often.  Peeing small amounts often.  Pain or burning when peeing.  Blood in the pee.  Pee that smells bad or not like normal.  Trouble peeing.  Pee that is cloudy.  Fluid coming from the vagina, if you are female.  Pain in the belly or lower back. Other symptoms include:  Throwing up (vomiting).  No urge to eat.  Feeling mixed up (confused).  Being tired  and grouchy (irritable).  A fever.  Watery poop (diarrhea). How is this treated? This condition may be treated with:  Antibiotic medicine.  Other medicines.  Drinking enough water. Follow these instructions at home:  Medicines  Take over-the-counter and prescription medicines only as told by your doctor.  If you were prescribed an antibiotic medicine, take it as told by your doctor. Do not stop taking it even if you start to feel better. General instructions  Make sure you: ? Pee until your bladder is empty. ? Do not hold pee for a long time. ? Empty your bladder after sex. ? Wipe from front to back after pooping if you are a female. Use each tissue one time when you wipe.  Drink enough fluid to keep your pee pale yellow.  Keep all follow-up visits as told by your doctor. This is important. Contact a doctor if:  You do not get better after 1-2 days.  Your symptoms go away and then come back. Get help right away if:  You have very bad back pain.  You have very bad pain in your lower belly.  You have a fever.  You are sick to your stomach (nauseous).  You are throwing up. Summary  A urinary tract infection (UTI) is an infection of any part of the urinary tract.  This condition is caused by germs in your genital area.  There are many risk factors for a UTI. These include having a small, thin   tube to drain pee and not being able to control when you pee or poop.  Treatment includes antibiotic medicines for germs.  Drink enough fluid to keep your pee pale yellow. This information is not intended to replace advice given to you by your health care provider. Make sure you discuss any questions you have with your health care provider. Document Released: 05/19/2008 Document Revised: 06/10/2018 Document Reviewed: 06/10/2018 Elsevier Interactive Patient Education  2019 [employer]  

## 2019-05-26 NOTE — Progress Notes (Signed)
Patient: Wendy Barnes Female    DOB: 07/12/1941   78 y.o.   MRN: [id number] Visit Date: 05/26/2019  Today's Provider: Mar Daring, PA-C   Chief Complaint  Patient presents with  . Urinary Urgency   Subjective:     HPI  Patient was treated over the phone for UTI. Patient finished antibiotic but is still having hematuria, urgency and pelvic pressure. Symptoms returned last night.    Allergies  Allergen Reactions  . Amoxicillin Hives and Rash    Has patient had a PCN reaction causing immediate rash, facial/tongue/throat swelling, SOB or lightheadedness with hypotension: Yes Has patient had a PCN reaction causing severe rash involving mucus membranes or skin necrosis: Yes Has patient had a PCN reaction that required hospitalization: No Has patient had a PCN reaction occurring within the last 10 years: Yes If all of the above answers are "NO", then may proceed with Cephalosporin use.   [name] Penicillins Hives and Rash     Current Outpatient Medications:  .  ALPRAZolam (XANAX) 0.5 MG tablet, TAKE ONE-HALF TABLET BY MOUTH AT BEDTIME AS NEEDED FOR SLEEP, Disp: 45 tablet, Rfl: 5 .  aspirin 81 MG tablet, Take 81 mg by mouth at bedtime. , Disp: , Rfl:  .  benazepril (LOTENSIN) 40 MG tablet, TAKE ONE TABLET BY MOUTH DAILY, Disp: 90 tablet, Rfl: 2 .  benzonatate (TESSALON PERLES) 100 MG capsule, Take 1 capsule (100 mg total) by mouth 3 (three) times daily as needed for cough., Disp: 60 capsule, Rfl: 0 .  Biotin 2500 MCG CAPS, Take 2,500 mcg by mouth daily., Disp: , Rfl:  .  Calcium Carbonate-Vitamin D 600-200 MG-UNIT CAPS, Take 1 tablet by mouth daily with supper. , Disp: , Rfl:  .  ibuprofen (ADVIL,MOTRIN) 200 MG tablet, Take 400 mg by mouth daily as needed for headache or moderate pain., Disp: , Rfl:  .  levothyroxine (SYNTHROID, LEVOTHROID) 100 MCG tablet, TAKE ONE TABLET BY MOUTH DAILY BEFORE BREAKFAST, Disp: 78 tablet, Rfl: 0 .  metFORMIN (GLUCOPHAGE) 500 MG tablet, TAKE  ONE TABLET BY MOUTH DAILY WITH BREAKFAST, Disp: 90 tablet, Rfl: 3 .  MULTIPLE VITAMIN PO, Take 1 tablet by mouth daily. , Disp: , Rfl:  .  Omega-3 Fatty Acids (FISH OIL ULTRA) 1400 MG CAPS, Take 1,400 mg by mouth daily., Disp: , Rfl:  .  oxybutynin (DITROPAN) 5 MG tablet, TAKE ONE TABLET BY MOUTH EVERY MORNING WITH BREAKFAST, Disp: 90 tablet, Rfl: 1 .  pravastatin (PRAVACHOL) 10 MG tablet, TAKE ONE TABLET BY MOUTH EVERY NIGHT AT BEDTIME, Disp: 90 tablet, Rfl: 2 .  ranitidine (ZANTAC) 150 MG tablet, Take 150 mg by mouth daily as needed for heartburn., Disp: , Rfl:  .  cephALEXin (KEFLEX) 500 MG capsule, Take 1 capsule (500 mg total) by mouth 2 (two) times daily. (Patient not taking: Reported on 05/26/2019), Disp: 14 capsule, Rfl: 0 .  hydrochlorothiazide (MICROZIDE) 12.5 MG capsule, TAKE ONE CAPSULE BY MOUTH DAILY, Disp: 90 capsule, Rfl: 2  Review of Systems  Constitutional: Negative for appetite change, chills, fatigue and fever.  Respiratory: Negative for chest tightness and shortness of breath.   Cardiovascular: Negative for chest pain and palpitations.  Gastrointestinal: Negative for abdominal pain, nausea and vomiting.  Genitourinary: Positive for dysuria, frequency, hematuria and urgency. Negative for flank pain.  Neurological: Negative for dizziness and weakness.    Social History   Tobacco Use  . Smoking status: Former Smoker    Packs/day: 1.00  Years: 25.00    Pack years: 25.00    Types: Cigarettes    Quit date: 12/15/1995    Years since quitting: 23.4  . Smokeless tobacco: Never Used  . Tobacco comment: quit in 1990's  Substance Use Topics  . Alcohol use: Yes    Alcohol/week: 0.0 standard drinks    Comment: occasionally - glass of wine      Objective:   BP (!) 158/84 (BP Location: Left Arm, Patient Position: Sitting, Cuff Size: Large)   Pulse 78   Temp 97.9 F (36.6 C) (Oral)   Resp 16   Wt 208 lb (94.3 kg)   SpO2 98%   BMI 33.57 kg/m  Vitals:   05/26/19 1141   BP: (!) 158/84  Pulse: 78  Resp: 16  Temp: 97.9 F (36.6 C)  TempSrc: Oral  SpO2: 98%  Weight: 208 lb (94.3 kg)     Physical Exam Constitutional:      General: She is not in acute distress.    Appearance: Normal appearance. She is well-developed. She is not ill-appearing or diaphoretic.  Cardiovascular:     Rate and Rhythm: Normal rate and regular rhythm.     Heart sounds: Normal heart sounds. No murmur. No friction rub. No gallop.   Pulmonary:     Effort: Pulmonary effort is normal. No respiratory distress.     Breath sounds: Normal breath sounds. No wheezing or rales.  Abdominal:     General: Bowel sounds are normal. There is no distension.     Palpations: Abdomen is soft. There is no mass.     Tenderness: There is abdominal tenderness in the suprapubic area. There is no guarding or rebound.  Skin:    General: Skin is warm and dry.  Neurological:     Mental Status: She is alert and oriented to person, place, and time.         Assessment & Plan    1. Acute cystitis without hematuria Worsening symptoms. UA positive. Will treat empirically with Bactrim as below. Continue to push fluids. Urine sent for culture. Will follow up pending C&S results. She is to call if symptoms do not improve or if they worsen.  - sulfamethoxazole-trimethoprim (BACTRIM DS) 800-160 MG tablet; Take 1 tablet by mouth 2 (two) times daily.  Dispense: 20 tablet; Refill: 0 - POCT Urinalysis Dipstick - Urine Culture     Mar Daring, PA-C  [facility]

## 2019-05-28 LAB — URINE CULTURE

## 2019-05-30 ENCOUNTER — Telehealth: Payer: Self-pay

## 2019-05-30 NOTE — Telephone Encounter (Signed)
-----   Message from Mar Daring, PA-C sent at 05/30/2019  8:32 AM EDT ----- Urine culture was negative for any one bacteria. It had a mixed normal flora present. If symptoms are improving with the antibiotic please continue until completed. If not improving please return for further evaluation.

## 2019-05-30 NOTE — Telephone Encounter (Signed)
LMTCB

## 2019-05-30 NOTE — Telephone Encounter (Signed)
Patient advised as below. Symptoms improved will continue taking antibiotic.

## 2019-06-02 DIAGNOSIS — D485 Neoplasm of uncertain behavior of skin: Secondary | ICD-10-CM | POA: Diagnosis not present

## 2019-06-02 DIAGNOSIS — Z1283 Encounter for screening for malignant neoplasm of skin: Secondary | ICD-10-CM | POA: Diagnosis not present

## 2019-06-02 DIAGNOSIS — L739 Follicular disorder, unspecified: Secondary | ICD-10-CM | POA: Diagnosis not present

## 2019-06-02 DIAGNOSIS — L820 Inflamed seborrheic keratosis: Secondary | ICD-10-CM | POA: Diagnosis not present

## 2019-06-02 DIAGNOSIS — D3612 Benign neoplasm of peripheral nerves and autonomic nervous system, upper limb, including shoulder: Secondary | ICD-10-CM | POA: Diagnosis not present

## 2019-06-02 DIAGNOSIS — L578 Other skin changes due to chronic exposure to nonionizing radiation: Secondary | ICD-10-CM | POA: Diagnosis not present

## 2019-06-02 DIAGNOSIS — L814 Other melanin hyperpigmentation: Secondary | ICD-10-CM | POA: Diagnosis not present

## 2019-06-02 DIAGNOSIS — Z85828 Personal history of other malignant neoplasm of skin: Secondary | ICD-10-CM | POA: Diagnosis not present

## 2019-06-02 DIAGNOSIS — I8393 Asymptomatic varicose veins of bilateral lower extremities: Secondary | ICD-10-CM | POA: Diagnosis not present

## 2019-06-02 DIAGNOSIS — L821 Other seborrheic keratosis: Secondary | ICD-10-CM | POA: Diagnosis not present

## 2019-06-02 DIAGNOSIS — D1800 Hemangioma unspecified site: Secondary | ICD-10-CM | POA: Diagnosis not present

## 2019-06-02 DIAGNOSIS — L400 Psoriasis vulgaris: Secondary | ICD-10-CM | POA: Diagnosis not present

## 2019-06-02 DIAGNOSIS — D229 Melanocytic nevi, unspecified: Secondary | ICD-10-CM | POA: Diagnosis not present

## 2019-06-09 NOTE — Progress Notes (Deleted)
Patient: Wendy Barnes Female    DOB: [birth year]   78 y.o.   MRN: [id number] Visit Date: 06/09/2019  Today's Provider: Mar Daring, PA-C   No chief complaint on file.  Subjective:     HPI  Essential hypertension: Stable.Patient to continue current medical treatment plan. Continue Benazepril 40mg  daily as well.   Type 2 diabetes mellitus without complication, without long-term current use of insulin: Continue metformin 500mg  daily.    Lab Results  Component Value Date   HGBA1C 5.8 (H) 04/27/2018     Allergies  Allergen Reactions  . Amoxicillin Hives and Rash    Has patient had a PCN reaction causing immediate rash, facial/tongue/throat swelling, SOB or lightheadedness with hypotension: Yes Has patient had a PCN reaction causing severe rash involving mucus membranes or skin necrosis: Yes Has patient had a PCN reaction that required hospitalization: No Has patient had a PCN reaction occurring within the last 10 years: Yes If all of the above answers are "NO", then may proceed with Cephalosporin use.   [name] Penicillins Hives and Rash     Current Outpatient Medications:  .  ALPRAZolam (XANAX) 0.5 MG tablet, TAKE ONE-HALF TABLET BY MOUTH AT BEDTIME AS NEEDED FOR SLEEP, Disp: 45 tablet, Rfl: 5 .  aspirin 81 MG tablet, Take 81 mg by mouth at bedtime. , Disp: , Rfl:  .  benazepril (LOTENSIN) 40 MG tablet, TAKE ONE TABLET BY MOUTH DAILY, Disp: 90 tablet, Rfl: 2 .  benzonatate (TESSALON PERLES) 100 MG capsule, Take 1 capsule (100 mg total) by mouth 3 (three) times daily as needed for cough., Disp: 60 capsule, Rfl: 0 .  Biotin 2500 MCG CAPS, Take 2,500 mcg by mouth daily., Disp: , Rfl:  .  Calcium Carbonate-Vitamin D 600-200 MG-UNIT CAPS, Take 1 tablet by mouth daily with supper. , Disp: , Rfl:  .  hydrochlorothiazide (MICROZIDE) 12.5 MG capsule, TAKE ONE CAPSULE BY MOUTH DAILY, Disp: 90 capsule, Rfl: 2 .  ibuprofen (ADVIL,MOTRIN) 200 MG tablet, Take 400 mg by mouth daily  as needed for headache or moderate pain., Disp: , Rfl:  .  levothyroxine (SYNTHROID, LEVOTHROID) 100 MCG tablet, TAKE ONE TABLET BY MOUTH DAILY BEFORE BREAKFAST, Disp: 78 tablet, Rfl: 0 .  metFORMIN (GLUCOPHAGE) 500 MG tablet, TAKE ONE TABLET BY MOUTH DAILY WITH BREAKFAST, Disp: 90 tablet, Rfl: 3 .  MULTIPLE VITAMIN PO, Take 1 tablet by mouth daily. , Disp: , Rfl:  .  Omega-3 Fatty Acids (FISH OIL ULTRA) 1400 MG CAPS, Take 1,400 mg by mouth daily., Disp: , Rfl:  .  oxybutynin (DITROPAN) 5 MG tablet, TAKE ONE TABLET BY MOUTH EVERY MORNING WITH BREAKFAST, Disp: 90 tablet, Rfl: 1 .  pravastatin (PRAVACHOL) 10 MG tablet, TAKE ONE TABLET BY MOUTH EVERY NIGHT AT BEDTIME, Disp: 90 tablet, Rfl: 2 .  ranitidine (ZANTAC) 150 MG tablet, Take 150 mg by mouth daily as needed for heartburn., Disp: , Rfl:  .  sulfamethoxazole-trimethoprim (BACTRIM DS) 800-160 MG tablet, Take 1 tablet by mouth 2 (two) times daily., Disp: 20 tablet, Rfl: 0  Review of Systems  Social History   Tobacco Use  . Smoking status: Former Smoker    Packs/day: 1.00    Years: 25.00    Pack years: 25.00    Types: Cigarettes    Quit date: 12/15/1995    Years since quitting: 23.4  . Smokeless tobacco: Never Used  . Tobacco comment: quit in 1990's  Substance Use Topics  . Alcohol use:  Yes    Alcohol/week: 0.0 standard drinks    Comment: occasionally - glass of wine      Objective:   There were no vitals taken for this visit. There were no vitals filed for this visit.   Physical Exam   No results found for any visits on 06/10/19.     Assessment & [place], PA-C  [facility]

## 2019-06-10 ENCOUNTER — Other Ambulatory Visit: Payer: Self-pay

## 2019-06-10 ENCOUNTER — Encounter: Payer: Self-pay | Admitting: Physician Assistant

## 2019-06-10 ENCOUNTER — Ambulatory Visit: Payer: Self-pay

## 2019-06-10 ENCOUNTER — Ambulatory Visit (INDEPENDENT_AMBULATORY_CARE_PROVIDER_SITE_OTHER): Payer: Medicare Other | Admitting: Physician Assistant

## 2019-06-10 VITALS — BP 151/73 | HR 70 | Temp 98.1°F | Ht 65.5 in | Wt 208.6 lb

## 2019-06-10 DIAGNOSIS — E034 Atrophy of thyroid (acquired): Secondary | ICD-10-CM

## 2019-06-10 DIAGNOSIS — I10 Essential (primary) hypertension: Secondary | ICD-10-CM

## 2019-06-10 DIAGNOSIS — E1136 Type 2 diabetes mellitus with diabetic cataract: Secondary | ICD-10-CM

## 2019-06-10 DIAGNOSIS — Z0000 Encounter for general adult medical examination without abnormal findings: Secondary | ICD-10-CM | POA: Diagnosis not present

## 2019-06-10 DIAGNOSIS — E7800 Pure hypercholesterolemia, unspecified: Secondary | ICD-10-CM

## 2019-06-10 MED ORDER — BENAZEPRIL-HYDROCHLOROTHIAZIDE 20-12.5 MG PO TABS: 1.0000 | ORAL_TABLET | Freq: Two times a day (BID) | ORAL | 0 refills | Status: DC

## 2019-06-10 NOTE — Patient Instructions (Signed)
Health Maintenance After Age 76 After age 76, you are at a higher risk for certain long-term diseases and infections as well as injuries from falls. Falls are a major cause of broken bones and head injuries in people who are older than age 76. Getting regular preventive care can help to keep you healthy and well. Preventive care includes getting regular testing and making lifestyle changes as recommended by your health care provider. Talk with your health care provider about:  Which screenings and tests you should have. A screening is a test that checks for a disease when you have no symptoms.  A diet and exercise plan that is right for you. What should I know about screenings and tests to prevent falls? Screening and testing are the best ways to find a health problem early. Early diagnosis and treatment give you the best chance of managing medical conditions that are common after age 76. Certain conditions and lifestyle choices may make you more likely to have a fall. Your health care provider may recommend:  Regular vision checks. Poor vision and conditions such as cataracts can make you more likely to have a fall. If you wear glasses, make sure to get your prescription updated if your vision changes.  Medicine review. Work with your health care provider to regularly review all of the medicines you are taking, including over-the-counter medicines. Ask your health care provider about any side effects that may make you more likely to have a fall. Tell your health care provider if any medicines that you take make you feel dizzy or sleepy.  Osteoporosis screening. Osteoporosis is a condition that causes the bones to get weaker. This can make the bones weak and cause them to break more easily.  Blood pressure screening. Blood pressure changes and medicines to control blood pressure can make you feel dizzy.  Strength and balance checks. Your health care provider may recommend certain tests to check your  strength and balance while standing, walking, or changing positions.  Foot health exam. Foot pain and numbness, as well as not wearing proper footwear, can make you more likely to have a fall.  Depression screening. You may be more likely to have a fall if you have a fear of falling, feel emotionally low, or feel unable to do activities that you used to do.  Alcohol use screening. Using too much alcohol can affect your balance and may make you more likely to have a fall. What actions can I take to lower my risk of falls? General instructions  Talk with your health care provider about your risks for falling. Tell your health care provider if: ? You fall. Be sure to tell your health care provider about all falls, even ones that seem minor. ? You feel dizzy, sleepy, or off-balance.  Take over-the-counter and prescription medicines only as told by your health care provider. These include any supplements.  Eat a healthy diet and maintain a healthy weight. A healthy diet includes low-fat dairy products, low-fat (lean) meats, and fiber from whole grains, beans, and lots of fruits and vegetables. Home safety  Remove any tripping hazards, such as rugs, cords, and clutter.  Install safety equipment such as grab bars in bathrooms and safety rails on stairs.  Keep rooms and walkways well-lit. Activity   Follow a regular exercise program to stay fit. This will help you maintain your balance. Ask your health care provider what types of exercise are appropriate for you.  If you need a cane or   walker, use it as recommended by your health care provider.  Wear supportive shoes that have nonskid soles. Lifestyle  Do not drink alcohol if your health care provider tells you not to drink.  If you drink alcohol, limit how much you have: ? 0-1 drink a day for women. ? 0-2 drinks a day for men.  Be aware of how much alcohol is in your drink. In the [place] one drink equals one typical bottle of beer (12  oz), one-half glass of wine (5 oz), or one shot of hard liquor (1 oz).  Do not use any products that contain nicotine or tobacco, such as cigarettes and e-cigarettes. If you need help quitting, ask your health care provider. Summary  Having a healthy lifestyle and getting preventive care can help to protect your health and wellness after age 76.  Screening and testing are the best way to find a health problem early and help you avoid having a fall. Early diagnosis and treatment give you the best chance for managing medical conditions that are more common for people who are older than age 76.  Falls are a major cause of broken bones and head injuries in people who are older than age 76. Take precautions to prevent a fall at home.  Work with your health care provider to learn what changes you can make to improve your health and wellness and to prevent falls. This information is not intended to replace advice given to you by your health care provider. Make sure you discuss any questions you have with your health care provider. Document Released: 10/14/2017 Document Revised: 10/14/2017 Document Reviewed: 10/14/2017 [employer] Interactive Patient Education  2019 [employer]  

## 2019-06-10 NOTE — Progress Notes (Signed)
Patient: Wendy Barnes, Female    DOB: [birth year], 78 y.o.   MRN: [id number] Visit Date: 06/10/2019  Today's Provider: Mar Daring, PA-C   Chief Complaint  Patient presents with  . Medicare Wellness   Subjective:    Annual wellness visit Wendy Barnes is a 78 y.o. female who presents today for her Subsequent Annual Wellness Visit. She feels well. She reports exercising none. She reports she is sleeping fairly well. -----------------------------------------------------------   Review of Systems  Constitutional: Negative.   HENT: Negative.   Eyes: Negative.   Respiratory: Negative.   Cardiovascular: Negative.   Gastrointestinal: Negative.   Endocrine: Negative.   Genitourinary: Negative.   Musculoskeletal: Negative.   Skin: Negative.   Allergic/Immunologic: Negative.   Neurological: Negative.   Hematological: Negative.   Psychiatric/Behavioral: Negative.     Social History   Socioeconomic History  . Marital status: Widowed    Spouse name: Not on file  . Number of children: 4  . Years of education: Not on file  . Highest education level: 12th grade  Occupational History  . Occupation: retired  Scientific laboratory technician  . Financial resource strain: Not hard at all  . Food insecurity    Worry: Never true    Inability: Not on file  . Transportation needs    Medical: No    Non-medical: No  Tobacco Use  . Smoking status: Former Smoker    Packs/day: 1.00    Years: 25.00    Pack years: 25.00    Types: Cigarettes    Quit date: 12/15/1995    Years since quitting: 23.5  . Smokeless tobacco: Never Used  . Tobacco comment: quit in 1990's  Substance and Sexual Activity  . Alcohol use: Yes    Alcohol/week: 0.0 standard drinks    Comment: occasionally - glass of wine  . Drug use: No  . Sexual activity: Not on file  Lifestyle  . Physical activity    Days per week: Not on file    Minutes per session: Not on file  . Stress: Not at all  Relationships  . Social Product manager on phone: Not on file    Gets together: Not on file    Attends religious service: Not on file    Active member of club or organization: Not on file    Attends meetings of clubs or organizations: Not on file    Relationship status: Not on file  . Intimate partner violence    Fear of current or ex partner: Not on file    Emotionally abused: Not on file    Physically abused: Not on file    Forced sexual activity: Not on file  Other Topics Concern  . Not on file  Social History Narrative  . Not on file    Patient Active Problem List   Diagnosis Date Noted  . Hypothyroidism 02/11/2018  . Osteoarthritis of spine with radiculopathy, lumbar region 04/16/2016  . Status post total right knee replacement 02/24/2016  . Colon polyp 05/16/2015  . Dysfunction of eustachian tube 05/16/2015  . T2DM (type 2 diabetes mellitus) ([place]) 05/16/2015  . Gonalgia 05/16/2015  . Plantar fasciitis 05/16/2015  . 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 Vaginal lesion 05/16/2015  . History of diverticulitis of colon 05/16/2015  . Burning or prickling sensation 04/10/2010  . Cannot sleep 12/10/2007  . Dermatologic disease 09/15/2007  . BP (high blood pressure) 08/28/2006  . Hypercholesteremia 08/28/2006  . Mixed incontinence 08/28/2006  Past Surgical History:  Procedure Laterality Date  . CATARACT EXTRACTION W/PHACO Left 11/17/2017   Procedure: CATARACT EXTRACTION PHACO AND INTRAOCULAR LENS PLACEMENT (IOC)-LEFT DIABETIC;  Surgeon: Birder Robson, MD;  Location: [facility] ORS;  Service: Ophthalmology;  Laterality: Left;  [place] 00:36 AP% 15.1 CDE 5.46 Fluid pack lot # [id number]  . CATARACT EXTRACTION W/PHACO Right 12/22/2017   Procedure: CATARACT EXTRACTION PHACO AND INTRAOCULAR LENS PLACEMENT ([place]);  Surgeon: Birder Robson, MD;  Location: [facility] ORS;  Service: Ophthalmology;  Laterality: Right;  [place] 00:46.1 AP% 12.9 CDE 5.97 Fluid pack lot # [id number]  . COLONOSCOPY WITH PROPOFOL N/A 04/24/2016    Procedure: COLONOSCOPY WITH PROPOFOL;  Surgeon: Manya Silvas, MD;  Location: [facility] END[facility]COPY;  Service: Endoscopy;  Laterality: N/A;  . DILATION AND CURETTAGE OF UTERUS  2002  . JOINT REPLACEMENT Right   . 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 THYROIDECTOMY  2010  . TONSILLECTOMY    . TONSILLECTOMY AND ADENOIDECTOMY  1950  . TOTAL KNEE ARTHROPLASTY  2015   right knee    Her family history includes Stroke in her father.     Previous Medications   ALPRAZOLAM (XANAX) 0.5 MG TABLET    TAKE ONE-HALF TABLET BY MOUTH AT BEDTIME AS NEEDED FOR SLEEP   ASPIRIN 81 MG TABLET    Take 81 mg by mouth at bedtime.    BENAZEPRIL (LOTENSIN) 40 MG TABLET    TAKE ONE TABLET BY MOUTH DAILY   BIOTIN 2500 MCG CAPS    Take 2,500 mcg by mouth daily.   CALCIUM CARBONATE-VITAMIN D 600-200 MG-UNIT CAPS    Take 1 tablet by mouth daily with supper.    HYDROCHLOROTHIAZIDE (MICROZIDE) 12.5 MG CAPSULE    TAKE ONE CAPSULE BY MOUTH DAILY   IBUPROFEN (ADVIL,MOTRIN) 200 MG TABLET    Take 400 mg by mouth daily as needed for headache or moderate pain.   LEVOTHYROXINE (SYNTHROID, LEVOTHROID) 100 MCG TABLET    TAKE ONE TABLET BY MOUTH DAILY BEFORE BREAKFAST   METFORMIN (GLUCOPHAGE) 500 MG TABLET    TAKE ONE TABLET BY MOUTH DAILY WITH BREAKFAST   MULTIPLE VITAMIN PO    Take 1 tablet by mouth daily.    OMEGA-3 FATTY ACIDS (FISH OIL ULTRA) 1400 MG CAPS    Take 1,400 mg by mouth daily.   OXYBUTYNIN (DITROPAN) 5 MG TABLET    TAKE ONE TABLET BY MOUTH EVERY MORNING WITH BREAKFAST   PRAVASTATIN (PRAVACHOL) 10 MG TABLET    TAKE ONE TABLET BY MOUTH EVERY NIGHT AT BEDTIME    Patient Care Team: Mar Daring, PA-C as PCP - General (Family Medicine) Birder Robson, MD as Referring Physician (Ophthalmology) Ralene Bathe, MD as Consulting Physician (Dermatology) Marry Guan, Laurice Record, MD as Consulting Physician (Orthopedic Surgery)      Objective:   Vitals: BP (!) 151/73 (BP Location: Left Arm, Patient Position:  Sitting, Cuff Size: Large)   Pulse 70   Temp 98.1 F (36.7 C) (Oral)   Ht 5' 5.5" (1.664 m)   Wt 208 lb 9.6 oz (94.6 kg)   BMI 34.18 kg/m   Physical Exam Vitals signs reviewed.  Constitutional:      General: She is not in acute distress.    Appearance: Normal appearance. She is well-developed. She is not ill-appearing or diaphoretic.  HENT:     Head: Normocephalic and atraumatic.     Right Ear: Tympanic membrane, ear canal and external ear normal.     Left Ear: Tympanic membrane, ear canal and  external ear normal.     Nose: Nose normal.     Mouth/Throat:     Mouth: Mucous membranes are moist.     Pharynx: Oropharynx is clear. No oropharyngeal exudate or posterior oropharyngeal erythema.  Eyes:     General: No scleral icterus.       Right eye: No discharge.        Left eye: No discharge.     Extraocular Movements: Extraocular movements intact.     Conjunctiva/sclera: Conjunctivae normal.     Pupils: Pupils are equal, round, and reactive to light.  Neck:     Musculoskeletal: Normal range of motion and neck supple.     Thyroid: No thyromegaly.     Vascular: No carotid bruit or JVD.     Trachea: No tracheal deviation.  Cardiovascular:     Rate and Rhythm: Normal rate and regular rhythm.     Pulses: Normal pulses.     Heart sounds: Normal heart sounds. No murmur. No friction rub. No gallop.   Pulmonary:     Effort: Pulmonary effort is normal. No respiratory distress.     Breath sounds: Normal breath sounds. No wheezing or rales.  Chest:     Chest wall: No tenderness.  Abdominal:     General: Bowel sounds are normal. There is no distension.     Palpations: Abdomen is soft. There is no mass.     Tenderness: There is no abdominal tenderness. There is no guarding or rebound.  Musculoskeletal: Normal range of motion.        General: No tenderness.  Lymphadenopathy:     Cervical: No cervical adenopathy.  Skin:    General: Skin is warm and dry.     Capillary Refill: Capillary  refill takes less than 2 seconds.     Findings: No rash.  Neurological:     General: No focal deficit present.     Mental Status: She is alert and oriented to person, place, and time. Mental status is at baseline.     Cranial Nerves: No cranial nerve deficit.     Motor: No weakness.     Gait: Gait normal.  Psychiatric:        Mood and Affect: Mood normal.        Behavior: Behavior normal.        Thought Content: Thought content normal.        Judgment: Judgment normal.     Activities of Daily Living In your present state of health, do you have any difficulty performing the following activities: 06/10/2019  Hearing? N  Vision? N  Difficulty concentrating or making decisions? N  Walking or climbing stairs? N  Dressing or bathing? N  Doing errands, shopping? N  Some recent data might be hidden    Fall Risk Assessment Fall Risk  06/10/2019 05/26/2019 04/19/2018 04/17/2017 04/16/2016  Falls in the past year? 0 0 No No No  Number falls in past yr: - 0 - - -     Depression Screen PHQ 2/9 Scores 06/10/2019 05/26/2019 04/19/2018 04/17/2017  PHQ - 2 Score 0 0 0 1  PHQ- 9 Score 0 0 - 3    Cognitive Testing - 6-CIT  Correct? Score   What year is it? yes 0 0 or 4  What month is it? yes 0 0 or 3  Memorize:    Pia Mau,  44,  [place],      What time is it? (within 1 hour)  yes 0 0 or 3  Count backwards from 20 yes 0 0, 2, or 4  Name the months of the year yes 4 0, 2, or 4  Repeat name & address above yes 2 0, 2, 4, 6, 8, or 10       TOTAL SCORE  6/28   Interpretation:  Normal  Normal (0-7) Abnormal (8-28)       Assessment & Plan:     Annual Wellness Visit  Reviewed patient's Family Medical History Reviewed and updated list of patient's medical providers Assessment of cognitive impairment was done Assessed patient's functional ability Established a written schedule for health screening [employer] Completed and Reviewed  Exercise Activities and  Dietary recommendations Goals    . DIET - REDUCE FAT INTAKE     Recommend cutting out all fried foods to help aid in weight loss.     . Reduce portion size     Recommend to work on portion control for daily meals.        Immunization History  Administered Date(s) Administered  . Influenza, High Dose Seasonal PF 10/16/2015, 08/20/2016, 09/28/2017, 10/16/2018  . Pneumococcal Conjugate-13 10/30/2014  . Pneumococcal Polysaccharide-23 01/02/2010  . Zoster 02/08/2009    Health Maintenance  Topic Date Due  . Samul Dada  09/03/1962  . FOOT EXAM  09/28/2018  . HEMOGLOBIN A1C  10/28/2018  . OPHTHALMOLOGY EXAM  12/29/2018  . INFLUENZA VACCINE  07/16/2019  . COLONOSCOPY  04/24/2026  . DEXA SCAN  Completed  . PNA vac Low Risk Adult  Completed     Discussed health benefits of physical activity, and encouraged her to engage in regular exercise appropriate for her age and condition.    1. Medicare annual wellness visit, subsequent Normal exam. Up to date on vaccinations and screenings.   2. Essential hypertension Stable. Diagnosis pulled for medication refill. Continue current medical treatment plan. Will check labs as below and f/u pending results. - benazepril-hydrochlorthiazide (LOTENSIN HCT) 20-12.5 MG tablet; Take 1 tablet by mouth 2 (two) times a day.  Dispense: 180 tablet; Refill: 0 - CBC w/Diff/Platelet - Comprehensive Metabolic Panel (CMET) - Lipid Profile - HgB A1c  3. Hypothyroidism due to acquired atrophy of thyroid Stable on levothyroxine 66mcg. Will check labs as below and f/u pending results. - CBC w/Diff/Platelet - Comprehensive Metabolic Panel (CMET) - TSH  4. Type 2 diabetes mellitus with diabetic cataract, without long-term current use of insulin (HCC) Stable. Continue metformin 500mg  daily. Will check labs as below and f/u pending results. - CBC w/Diff/Platelet - Comprehensive Metabolic Panel (CMET) - Lipid Profile - HgB A1c  5.  Hypercholesteremia Stable. Continue pravastatin 10mg . Will check labs as below and f/u pending results. - CBC w/Diff/Platelet - Comprehensive Metabolic Panel (CMET) - Lipid Profile - HgB A1c  ------------------------------------------------------------------------------------------------------------

## 2019-06-13 DIAGNOSIS — E1136 Type 2 diabetes mellitus with diabetic cataract: Secondary | ICD-10-CM | POA: Diagnosis not present

## 2019-06-13 DIAGNOSIS — E034 Atrophy of thyroid (acquired): Secondary | ICD-10-CM | POA: Diagnosis not present

## 2019-06-13 DIAGNOSIS — E7800 Pure hypercholesterolemia, unspecified: Secondary | ICD-10-CM | POA: Diagnosis not present

## 2019-06-13 DIAGNOSIS — I10 Essential (primary) hypertension: Secondary | ICD-10-CM | POA: Diagnosis not present

## 2019-06-14 ENCOUNTER — Telehealth: Payer: Self-pay

## 2019-06-14 DIAGNOSIS — E034 Atrophy of thyroid (acquired): Secondary | ICD-10-CM

## 2019-06-14 LAB — COMPREHENSIVE METABOLIC PANEL
ALT: 21 IU/L (ref 0–32)
AST: 18 IU/L (ref 0–40)
Albumin/Globulin Ratio: 1.9 (ref 1.2–2.2)
Albumin: 4.5 g/dL (ref 3.7–4.7)
Alkaline Phosphatase: 87 IU/L (ref 39–117)
BUN/Creatinine Ratio: 24 (ref 12–28)
BUN: 21 mg/dL (ref 8–27)
Bilirubin Total: 0.4 mg/dL (ref 0.0–1.2)
CO2: 21 mmol/L (ref 20–29)
Calcium: 10 mg/dL (ref 8.7–10.3)
Chloride: 103 mmol/L (ref 96–106)
Creatinine, Ser: 0.87 mg/dL (ref 0.57–1.00)
GFR calc Af Amer: 75 mL/min/{1.73_m2} (ref >59)
GFR calc non Af Amer: 65 mL/min/{1.73_m2} (ref >59)
Globulin, Total: 2.4 g/dL (ref 1.5–4.5)
Glucose: 106 mg/dL — ABNORMAL HIGH (ref 65–99)
Potassium: 4.3 mmol/L (ref 3.5–5.2)
Sodium: 140 mmol/L (ref 134–144)
Total Protein: 6.9 g/dL (ref 6.0–8.5)

## 2019-06-14 LAB — CBC WITH DIFFERENTIAL/PLATELET
Basophils Absolute: 0.1 10*3/uL (ref 0.0–0.2)
Basos: 1 %
EOS (ABSOLUTE): 0.2 10*3/uL (ref 0.0–0.4)
Eos: 3 %
Hematocrit: 40 % (ref 34.0–46.6)
Hemoglobin: 13.2 g/dL (ref 11.1–15.9)
Immature Grans (Abs): 0 10*3/uL (ref 0.0–0.1)
Immature Granulocytes: 0 %
Lymphocytes Absolute: 3 10*3/uL (ref 0.7–3.1)
Lymphs: 34 %
MCH: 28.3 pg (ref 26.6–33.0)
MCHC: 33 g/dL (ref 31.5–35.7)
MCV: 86 fL (ref 79–97)
Monocytes Absolute: 0.6 10*3/uL (ref 0.1–0.9)
Monocytes: 7 %
Neutrophils Absolute: 5 10*3/uL (ref 1.4–7.0)
Neutrophils: 55 %
Platelets: 231 10*3/uL (ref 150–450)
RBC: 4.67 x10E6/uL (ref 3.77–5.28)
RDW: 13.6 % (ref 11.7–15.4)
WBC: 8.9 10*3/uL (ref 3.4–10.8)

## 2019-06-14 LAB — LIPID PANEL
Chol/HDL Ratio: 3.6 ratio (ref 0.0–4.4)
Cholesterol, Total: 205 mg/dL — ABNORMAL HIGH (ref 100–199)
HDL: 57 mg/dL (ref >39)
LDL Calculated: 123 mg/dL — ABNORMAL HIGH (ref 0–99)
Triglycerides: 127 mg/dL (ref 0–149)
VLDL Cholesterol Cal: 25 mg/dL (ref 5–40)

## 2019-06-14 LAB — TSH: TSH: 0.332 u[IU]/mL — ABNORMAL LOW (ref 0.450–4.500)

## 2019-06-14 LAB — HEMOGLOBIN A1C
Est. average glucose Bld gHb Est-mCnc: 128 mg/dL
Hgb A1c MFr Bld: 6.1 % — ABNORMAL HIGH (ref 4.8–5.6)

## 2019-06-14 MED ORDER — LEVOTHYROXINE SODIUM 88 MCG PO TABS: 88.0000 ug | ORAL_TABLET | Freq: Every day | ORAL | 3 refills | Status: DC

## 2019-06-14 NOTE — Telephone Encounter (Signed)
-----   Message from Mar Daring, [place] sent at 06/14/2019 11:45 AM EDT ----- Blood count is normal. Kidney and liver function is normal. Levothyroxine is slightly overcorrected. I would recommend to decrease dose to 43mcg from 17mcg and recheck in 8-12 weeks. If agreeable I will change dose. Cholesterol is stable. A1c is up slightly from 5.8 to 6.1. Work on healthy lifestyle modifications with dieting and exercise.

## 2019-06-14 NOTE — Telephone Encounter (Signed)
Sent in

## 2019-06-14 NOTE — Telephone Encounter (Signed)
Patient advised. She agrees to decrease Levothyroxine. [facility].

## 2019-06-16 DIAGNOSIS — M1711 Unilateral primary osteoarthritis, right knee: Secondary | ICD-10-CM | POA: Diagnosis not present

## 2019-06-16 DIAGNOSIS — R2231 Localized swelling, mass and lump, right upper limb: Secondary | ICD-10-CM | POA: Diagnosis not present

## 2019-06-16 DIAGNOSIS — Z96651 Presence of right artificial knee joint: Secondary | ICD-10-CM | POA: Diagnosis not present

## 2019-06-22 ENCOUNTER — Other Ambulatory Visit: Payer: Self-pay

## 2019-06-22 ENCOUNTER — Ambulatory Visit
Admission: RE | Admit: 2019-06-22 | Discharge: 2019-06-22 | Disposition: A | Discharge status: Home / Self Care | Payer: Medicare Other | Source: Ambulatory Visit | Attending: Physician Assistant | Admitting: Physician Assistant

## 2019-06-22 DIAGNOSIS — Z1231 Encounter for screening mammogram for malignant neoplasm of breast: Secondary | ICD-10-CM | POA: Insufficient documentation

## 2019-06-27 ENCOUNTER — Telehealth: Payer: Self-pay

## 2019-06-27 NOTE — Telephone Encounter (Signed)
-----   Message from Mar Daring, PA-C sent at 06/27/2019  1:30 PM EDT ----- Normal mammogram. Repeat screening in one year.

## 2019-06-27 NOTE — Telephone Encounter (Signed)
Patient advised as directed below. 

## 2019-07-10 ENCOUNTER — Other Ambulatory Visit: Payer: Self-pay | Admitting: Physician Assistant

## 2019-07-10 DIAGNOSIS — N3946 Mixed incontinence: Secondary | ICD-10-CM

## 2019-07-21 DIAGNOSIS — E119 Type 2 diabetes mellitus without complications: Secondary | ICD-10-CM | POA: Diagnosis not present

## 2019-07-21 LAB — HM DIABETES EYE EXAM

## 2019-07-22 ENCOUNTER — Encounter: Payer: Self-pay | Admitting: Physician Assistant

## 2019-07-26 ENCOUNTER — Encounter: Payer: Self-pay | Admitting: Physician Assistant

## 2019-07-26 ENCOUNTER — Ambulatory Visit (INDEPENDENT_AMBULATORY_CARE_PROVIDER_SITE_OTHER): Payer: Medicare Other | Admitting: Physician Assistant

## 2019-07-26 ENCOUNTER — Other Ambulatory Visit: Payer: Self-pay

## 2019-07-26 VITALS — BP 156/82 | HR 67 | Temp 98.2°F | Resp 16 | Wt 210.0 lb

## 2019-07-26 DIAGNOSIS — I10 Essential (primary) hypertension: Secondary | ICD-10-CM

## 2019-07-26 DIAGNOSIS — E034 Atrophy of thyroid (acquired): Secondary | ICD-10-CM

## 2019-07-26 MED ORDER — AMLODIPINE BESYLATE 5 MG PO TABS: 5.0000 mg | ORAL_TABLET | Freq: Every day | ORAL | 3 refills | Status: DC

## 2019-07-26 NOTE — Progress Notes (Signed)
Patient: Wendy Barnes Female    DOB: [birth year]   78 y.o.   MRN: [id number] Visit Date: 07/26/2019  Today's Provider: Mar Daring, PA-C   Chief Complaint  Patient presents with  . Follow-up    Hypertension and TSH   Subjective:     HPI    Hypertension, follow-up:  BP Readings from Last 3 Encounters:  07/26/19 (!) 156/82  06/10/19 (!) 151/73  05/26/19 (!) 158/84    She was last seen for hypertension 06/10/2019.  BP at that visit was 151/73. Management since that visit includes; labs checked, no changes .She reports excellent compliance with treatment. She is not having side effects.  She is exercising. She is adherent to low salt diet.   Outside blood pressures are n/a. She is experiencing none.  Patient denies chest pain, chest pressure/discomfort, exertional chest pressure/discomfort, fatigue, irregular heart beat, lower extremity edema, near-syncope and palpitations.   Cardiovascular risk factors include advanced age (older than 11 for men, 47 for women), diabetes mellitus, dyslipidemia and hypertension.  ------------------------------------------------------------------------  Hypothyroidism due to acquired atrophy of thyroid From 06/10/2019-labs checked. Decreased levothyroxine to 88 mcg qd. Advised to rechecked TSH in 8-12 weeks.   Allergies  Allergen Reactions  . Amoxicillin Hives and Rash    Has patient had a PCN reaction causing immediate rash, facial/tongue/throat swelling, SOB or lightheadedness with hypotension: Yes Has patient had a PCN reaction causing severe rash involving mucus membranes or skin necrosis: Yes Has patient had a PCN reaction that required hospitalization: No Has patient had a PCN reaction occurring within the last 10 years: Yes If all of the above answers are "NO", then may proceed with Cephalosporin use.   [name] Penicillins Hives and Rash     Current Outpatient Medications:  .  ALPRAZolam (XANAX) 0.5 MG tablet, TAKE  ONE-HALF TABLET BY MOUTH AT BEDTIME AS NEEDED FOR SLEEP, Disp: 45 tablet, Rfl: 5 .  aspirin 81 MG tablet, Take 81 mg by mouth at bedtime. , Disp: , Rfl:  .  benazepril-hydrochlorthiazide (LOTENSIN HCT) 20-12.5 MG tablet, Take 1 tablet by mouth 2 (two) times a day., Disp: 180 tablet, Rfl: 0 .  Biotin 2500 MCG CAPS, Take 2,500 mcg by mouth daily., Disp: , Rfl:  .  Calcium Carbonate-Vitamin D 600-200 MG-UNIT CAPS, Take 1 tablet by mouth daily with supper. , Disp: , Rfl:  .  ibuprofen (ADVIL,MOTRIN) 200 MG tablet, Take 400 mg by mouth daily as needed for headache or moderate pain., Disp: , Rfl:  .  levothyroxine (SYNTHROID) 88 MCG tablet, Take 1 tablet (88 mcg total) by mouth daily., Disp: 90 tablet, Rfl: 3 .  metFORMIN (GLUCOPHAGE) 500 MG tablet, TAKE ONE TABLET BY MOUTH DAILY WITH BREAKFAST, Disp: 90 tablet, Rfl: 3 .  MULTIPLE VITAMIN PO, Take 1 tablet by mouth daily. , Disp: , Rfl:  .  Omega-3 Fatty Acids (FISH OIL ULTRA) 1400 MG CAPS, Take 1,400 mg by mouth daily., Disp: , Rfl:  .  pravastatin (PRAVACHOL) 10 MG tablet, TAKE ONE TABLET BY MOUTH EVERY NIGHT AT BEDTIME, Disp: 90 tablet, Rfl: 2 .  oxybutynin (DITROPAN) 5 MG tablet, TAKE ONE TABLET BY MOUTH EVERY MORNING WITH BREAKFAST (Patient not taking: Reported on 07/26/2019), Disp: 90 tablet, Rfl: 1  Review of Systems  Constitutional: Negative for appetite change, chills, fatigue and fever.  Respiratory: Negative for chest tightness and shortness of breath.   Cardiovascular: Negative for chest pain and palpitations.  Gastrointestinal: Negative for abdominal pain, nausea  and vomiting.  Neurological: Negative for dizziness and weakness.    Social History   Tobacco Use  . Smoking status: Former Smoker    Packs/day: 1.00    Years: 25.00    Pack years: 25.00    Types: Cigarettes    Quit date: 12/15/1995    Years since quitting: 23.6  . Smokeless tobacco: Never Used  . Tobacco comment: quit in 1990's  Substance Use Topics  . Alcohol use:  Yes    Alcohol/week: 0.0 standard drinks    Comment: occasionally - glass of wine      Objective:   BP (!) 156/82 (BP Location: Left Arm, Patient Position: Sitting, Cuff Size: Large)   Pulse 67   Temp 98.2 F (36.8 C) (Oral)   Resp 16   Wt 210 lb (95.3 kg)   BMI 34.41 kg/m  Vitals:   07/26/19 1336  BP: (!) 156/82  Pulse: 67  Resp: 16  Temp: 98.2 F (36.8 C)  TempSrc: Oral  Weight: 210 lb (95.3 kg)     Physical Exam Vitals signs reviewed.  Constitutional:      General: She is not in acute distress.    Appearance: Normal appearance. She is well-developed. She is obese. She is not diaphoretic.  Neck:     Musculoskeletal: Normal range of motion and neck supple.  Cardiovascular:     Rate and Rhythm: Normal rate and regular rhythm.     Heart sounds: Normal heart sounds. No murmur. No friction rub. No gallop.   Pulmonary:     Effort: Pulmonary effort is normal. No respiratory distress.     Breath sounds: Normal breath sounds. No wheezing or rales.  Neurological:     Mental Status: She is alert.      No results found for any visits on 07/26/19.     Assessment & Plan    1. Essential hypertension Elevated. Continue Benazepril-hctz BID. Add amlodipine 5mg  as below. I will see her back in 4 weeks.  - amLODipine (NORVASC) 5 MG tablet; Take 1 tablet (5 mg total) by mouth daily.  Dispense: 90 tablet; Refill: 3  2. Hypothyroidism due to acquired atrophy of thyroid Decreased levothyroxine 79mcg. Recheck TSH as below.  - TSH     Mar Daring, PA-C  [facility]

## 2019-07-26 NOTE — Patient Instructions (Signed)
Amlodipine tablets What is this medicine? AMLODIPINE (am LOE di peen) is a calcium-channel blocker. It affects the amount of calcium found in your heart and muscle cells. This relaxes your blood vessels, which can reduce the amount of work the heart has to do. This medicine is used to lower high blood pressure. It is also used to prevent chest pain. This medicine may be used for other purposes; ask your health care provider or pharmacist if you have questions. COMMON BRAND NAME(S): Norvasc What should I tell my health care provider before I take this medicine? They need to know if you have any of these conditions:  heart disease  liver disease  an unusual or allergic reaction to amlodipine, other medicines, foods, dyes, or preservatives  pregnant or trying to get pregnant  breast-feeding How should I use this medicine? Take this medicine by mouth with a glass of water. Follow the directions on the prescription label. You can take it with or without food. If it upsets your stomach, take it with food. Take your medicine at regular intervals. Do not take it more often than directed. Do not stop taking except on your doctor's advice. Talk to your pediatrician regarding the use of this medicine in children. While this drug may be prescribed for children as young as 6 years for selected conditions, precautions do apply. Patients over 65 years of age may have a stronger reaction and need a smaller dose. Overdosage: If you think you have taken too much of this medicine contact a poison control center or emergency room at once. NOTE: This medicine is only for you. Do not share this medicine with others. What if I miss a dose? If you miss a dose, take it as soon as you can. If it is almost time for your next dose, take only that dose. Do not take double or extra doses. What may interact with this medicine? Do not take this medicine with any of the following medications:  tranylcypromine This  medicine may also interact with the following medications:  clarithromycin  cyclosporine  diltiazem  itraconazole  simvastatin  tacrolimus This list may not describe all possible interactions. Give your health care provider a list of all the medicines, herbs, non-prescription drugs, or dietary supplements you use. Also tell them if you smoke, drink alcohol, or use illegal drugs. Some items may interact with your medicine. What should I watch for while using this medicine? Visit your healthcare professional for regular checks on your progress. Check your blood pressure as directed. Ask your healthcare professional what your blood pressure should be and when you should contact him or her. Do not treat yourself for coughs, colds, or pain while you are using this medicine without asking your healthcare professional for advice. Some medicines may increase your blood pressure. You may get dizzy. Do not drive, use machinery, or do anything that needs mental alertness until you know how this medicine affects you. Do not stand or sit up quickly, especially if you are an older patient. This reduces the risk of dizzy or fainting spells. Avoid alcoholic drinks; they can make you dizzier. What side effects may I notice from receiving this medicine? Side effects that you should report to your doctor or health care professional as soon as possible:  allergic reactions like skin rash, itching or hives; swelling of the face, lips, or tongue  fast, irregular heartbeat  signs and symptoms of low blood pressure like dizziness; feeling faint or lightheaded, falls; unusually weak   or tired  swelling of ankles, feet, hands Side effects that usually do not require medical attention (report these to your doctor or health care professional if they continue or are bothersome):  dry mouth  facial flushing  headache  stomach pain  tiredness This list may not describe all possible side effects. Call your  doctor for medical advice about side effects. You may report side effects to FDA at 1-800-FDA-1088. Where should I keep my medicine? Keep out of the reach of children. Store at room temperature between 59 and 86 degrees F (15 and 30 degrees C). Throw away any unused medicine after the expiration date. NOTE: This sheet is a summary. It may not cover all possible information. If you have questions about this medicine, talk to your doctor, pharmacist, or health care provider.  2020 Elsevier/Gold Standard (2018-06-25 15:07:10)  

## 2019-07-27 ENCOUNTER — Telehealth: Payer: Self-pay | Admitting: *Deleted

## 2019-07-27 LAB — TSH: TSH: 0.668 u[IU]/mL (ref 0.450–4.500)

## 2019-07-27 NOTE — Telephone Encounter (Signed)
Pt returned call ° °teri °

## 2019-07-27 NOTE — Telephone Encounter (Signed)
Patient notified of results.

## 2019-07-27 NOTE — Telephone Encounter (Signed)
LMOVM for pt to return call 

## 2019-07-27 NOTE — Telephone Encounter (Signed)
-----   Message from Mar Daring, PA-C sent at 07/27/2019  8:14 AM EDT ----- Thyroid is in normal range. Continue levothyroxine 62mcg.

## 2019-08-19 NOTE — Progress Notes (Signed)
Patient: Wendy Barnes Female    DOB: 04-25-1941   78 y.o.   MRN: [id number] Visit Date: 08/23/2019  Today's Provider: Mar Daring, PA-C   No chief complaint on file.  Subjective:     HPI  Essential hypertension: Patient's BP 4 weeks ago was elevated. She was advised to continue Benazepril-hctz BID. Amlodipine 5mg  was added and she is here for her 4 weeks f/u. No side effects from the medicine. She reports that she has started exercising and has not noticed any swelling.  Wt Readings from Last 3 Encounters:  08/23/19 208 lb 3.2 oz (94.4 kg)  07/26/19 210 lb (95.3 kg)  06/10/19 208 lb 9.6 oz (94.6 kg)     Allergies  Allergen Reactions  . Amoxicillin Hives and Rash    Has patient had a PCN reaction causing immediate rash, facial/tongue/throat swelling, SOB or lightheadedness with hypotension: Yes Has patient had a PCN reaction causing severe rash involving mucus membranes or skin necrosis: Yes Has patient had a PCN reaction that required hospitalization: No Has patient had a PCN reaction occurring within the last 10 years: Yes If all of the above answers are "NO", then may proceed with Cephalosporin use.   [name] Penicillins Hives and Rash     Current Outpatient Medications:  .  ALPRAZolam (XANAX) 0.5 MG tablet, TAKE ONE-HALF TABLET BY MOUTH AT BEDTIME AS NEEDED FOR SLEEP, Disp: 45 tablet, Rfl: 5 .  amLODipine (NORVASC) 5 MG tablet, Take 1 tablet (5 mg total) by mouth daily., Disp: 90 tablet, Rfl: 3 .  aspirin 81 MG tablet, Take 81 mg by mouth at bedtime. , Disp: , Rfl:  .  benazepril-hydrochlorthiazide (LOTENSIN HCT) 20-12.5 MG tablet, Take 1 tablet by mouth 2 (two) times a day., Disp: 180 tablet, Rfl: 0 .  Biotin 2500 MCG CAPS, Take 2,500 mcg by mouth daily., Disp: , Rfl:  .  Calcium Carbonate-Vitamin D 600-200 MG-UNIT CAPS, Take 1 tablet by mouth daily with supper. , Disp: , Rfl:  .  CRANBERRY CONCENTRATE PO, Take by mouth., Disp: , Rfl:  .  levothyroxine  (SYNTHROID) 88 MCG tablet, Take 1 tablet (88 mcg total) by mouth daily., Disp: 90 tablet, Rfl: 3 .  metFORMIN (GLUCOPHAGE) 500 MG tablet, TAKE ONE TABLET BY MOUTH DAILY WITH BREAKFAST, Disp: 90 tablet, Rfl: 3 .  MULTIPLE VITAMIN PO, Take 1 tablet by mouth daily. , Disp: , Rfl:  .  Omega-3 Fatty Acids (FISH OIL ULTRA) 1400 MG CAPS, Take 1,400 mg by mouth daily., Disp: , Rfl:  .  pravastatin (PRAVACHOL) 10 MG tablet, TAKE ONE TABLET BY MOUTH EVERY NIGHT AT BEDTIME, Disp: 90 tablet, Rfl: 2  Review of Systems  Constitutional: Negative for appetite change, chills, fatigue and fever.  Respiratory: Negative for chest tightness and shortness of breath.   Cardiovascular: Negative for chest pain and palpitations.  Gastrointestinal: Negative for abdominal pain, nausea and vomiting.  Neurological: Negative for dizziness and weakness.    Social History   Tobacco Use  . Smoking status: Former Smoker    Packs/day: 1.00    Years: 25.00    Pack years: 25.00    Types: Cigarettes    Quit date: 12/15/1995    Years since quitting: 23.7  . Smokeless tobacco: Never Used  . Tobacco comment: quit in 1990's  Substance Use Topics  . Alcohol use: Yes    Alcohol/week: 0.0 standard drinks    Comment: occasionally - glass of wine  Objective:   BP 133/79 (BP Location: Left Arm, Patient Position: Sitting, Cuff Size: Large)   Pulse 72   Temp (!) 97.2 F (36.2 C) (Other (Comment)) Comment (Src): forehead  Resp 16   Wt 208 lb 3.2 oz (94.4 kg)   BMI 34.12 kg/m  Vitals:   08/23/19 1329  BP: 133/79  Pulse: 72  Resp: 16  Temp: (!) 97.2 F (36.2 C)  TempSrc: Other (Comment)  Weight: 208 lb 3.2 oz (94.4 kg)  Body mass index is 34.12 kg/m.   Physical Exam Vitals signs reviewed.  Constitutional:      General: She is not in acute distress.    Appearance: Normal appearance. She is well-developed. She is obese. She is not ill-appearing or diaphoretic.  Neck:     Musculoskeletal: Normal range of  motion and neck supple.  Cardiovascular:     Rate and Rhythm: Normal rate and regular rhythm.     Pulses: Normal pulses.     Heart sounds: Normal heart sounds. No murmur. No friction rub. No gallop.   Pulmonary:     Effort: Pulmonary effort is normal. No respiratory distress.     Breath sounds: Normal breath sounds. No wheezing or rales.  Musculoskeletal:     Right lower leg: No edema.     Left lower leg: No edema.  Neurological:     Mental Status: She is alert.      No results found for any visits on 08/23/19.     Assessment & Plan    1. Essential hypertension Stable now with Benazepril-hctz and amlodipine. Will check labs as below and f/u pending results. If labs are normal she can return to annual f/u in June 2021 unless issues arise.  - Basic Metabolic Panel (BMET)  2. OAB (overactive bladder) Stopped oxybutynin since not effective. Start Vesicare. Call if not improving or tolerating and may consider changing to Mybetriq.  - solifenacin (VESICARE) 10 MG tablet; Take 1 tablet (10 mg total) by mouth daily.  Dispense: 30 tablet; Refill: 0  3. Need for influenza vaccination Flu vaccine given today without complication. Patient sat upright for 15 minutes to check for adverse reaction before being released. - Flu Vaccine QUAD High Dose(Fluad)     Mar Daring, PA-C  [facility]

## 2019-08-23 ENCOUNTER — Ambulatory Visit (INDEPENDENT_AMBULATORY_CARE_PROVIDER_SITE_OTHER): Payer: Medicare Other | Admitting: Physician Assistant

## 2019-08-23 ENCOUNTER — Encounter: Payer: Self-pay | Admitting: Physician Assistant

## 2019-08-23 ENCOUNTER — Other Ambulatory Visit: Payer: Self-pay

## 2019-08-23 VITALS — BP 133/79 | HR 72 | Temp 97.2°F | Resp 16 | Wt 208.2 lb

## 2019-08-23 DIAGNOSIS — Z23 Encounter for immunization: Secondary | ICD-10-CM

## 2019-08-23 DIAGNOSIS — I10 Essential (primary) hypertension: Secondary | ICD-10-CM

## 2019-08-23 DIAGNOSIS — N3281 Overactive bladder: Secondary | ICD-10-CM | POA: Diagnosis not present

## 2019-08-23 MED ORDER — SOLIFENACIN SUCCINATE 10 MG PO TABS: 10.0000 mg | ORAL_TABLET | Freq: Every day | ORAL | 0 refills | Status: DC

## 2019-08-23 NOTE — Patient Instructions (Signed)
Solifenacin tablets What is this medicine? SOLIFENACIN (sol i FEN a cin) is used to treat overactive bladder. This medicine reduces the amount of bathroom visits. It may also help to control wetting accidents. It may be used alone, but sometimes may be given with other treatments. This medicine may be used for other purposes; ask your health care provider or pharmacist if you have questions. COMMON BRAND NAME(S): VESIcare What should I tell my health care provider before I take this medicine? They need to know if you have any of these conditions:  glaucoma  history of irregular heartbeat  kidney disease  liver disease  problems urinating  prostate disease  stomach or intestine problems  an unusual or allergic reaction to solifenacin, other medicines, foods, dyes, or preservatives  pregnant or trying to get pregnant  breast-feeding How should I use this medicine? Take this medicine by mouth with a glass of water. Follow the directions on the prescription label. Swallow the tablets whole. You can take it with or without food. If it upsets your stomach, take it with food. Take your doses at regular intervals. Do not take your medicine more often than directed. Do not stop taking except on the advice of your doctor or health care professional. Talk to your pediatrician regarding the use of this medicine in children. Special care may be needed. Overdosage: If you think you have taken too much of this medicine contact a poison control center or emergency room at once. NOTE: This medicine is only for you. Do not share this medicine with others. What if I miss a dose? If you miss a dose, take it as soon as you can. If it is almost time for your next dose, take only that dose. Do not take double or extra doses. What may interact with this medicine? Do not take this medicine with any of the following medications:  cisapride  dronedarone  pimozide  thioridazine This medicine may also  interact with the following medications:  atropine  certain other medicines for bladder problems like oxybutynin, tolterodine  certain medicines for fungal infections like fluconazole, itraconazole, ketoconazole, posaconazole, voriconazole  certain medicines for travel sickness like scopolamine  other medicines that prolong the QT interval (cause an abnormal heart rhythm) like dofetilide, ziprasidone  rifampin  St. John's Wort This list may not describe all possible interactions. Give your health care provider a list of all the medicines, herbs, non-prescription drugs, or dietary supplements you use. Also tell them if you smoke, drink alcohol, or use illegal drugs. Some items may interact with your medicine. What should I watch for while using this medicine? Visit your doctor or health care professional for regular checks on your progress. You may need to limit your intake tea, coffee, caffeinated sodas, and alcohol. These drinks may make your symptoms worse. You may get drowsy or dizzy. Do not drive, use machinery, or do anything that needs mental alertness until you know how this medicine affects you. Do not stand or sit up quickly, especially if you are an older patient. This reduces the risk of dizzy or fainting spells. Your mouth may get dry. Chewing sugarless gum or sucking hard candy, and drinking plenty of water may help. Contact your doctor if the problem does not go away or is severe. This medicine may cause dry eyes and blurred vision. If you wear contact lenses, you may feel some discomfort. Lubricating drops may help. See your eye care professional if the problem does not go away or  is severe. Avoid extreme heat. This medicine can cause you to sweat less than normal. Your body temperature could increase to dangerous levels, which may lead to heat stroke. What side effects may I notice from receiving this medicine? Side effects that you should report to your doctor or health care  professional as soon as possible:  allergic reactions like skin rash, itching or hives, swelling of the face, lips, or tongue  breathing problems  changes in vision  confusion  eye pain  fast, irregular heartbeat  feeling faint or lightheaded, falls  hallucinations, loss of contact with reality  signs and symptoms of heat stroke such as decreased sweating; high temperature or fever; nausea; unusually weak or tired  trouble passing urine or change in the amount of urine Side effects that usually do not require medical attention (report to your doctor or health care professional if they continue or are bothersome):  constipation  dizziness  drowsiness  headache  stomach upset This list may not describe all possible side effects. Call your doctor for medical advice about side effects. You may report side effects to FDA at 1-800-FDA-1088. Where should I keep my medicine? Keep out of the reach of children. Store at room temperature between 15 and 30 degrees C (59 and 86 degrees F). Throw away any unused medicine after the expiration date. NOTE: This sheet is a summary. It may not cover all possible information. If you have questions about this medicine, talk to your doctor, pharmacist, or health care provider.  2020 Elsevier/Gold Standard (2018-11-23 10:17:17)

## 2019-08-24 ENCOUNTER — Other Ambulatory Visit: Payer: Self-pay | Admitting: Physician Assistant

## 2019-08-24 ENCOUNTER — Telehealth: Payer: Self-pay

## 2019-08-24 DIAGNOSIS — I10 Essential (primary) hypertension: Secondary | ICD-10-CM

## 2019-08-24 LAB — BASIC METABOLIC PANEL
BUN/Creatinine Ratio: 18 (ref 12–28)
BUN: 14 mg/dL (ref 8–27)
CO2: 23 mmol/L (ref 20–29)
Calcium: 9.7 mg/dL (ref 8.7–10.3)
Chloride: 103 mmol/L (ref 96–106)
Creatinine, Ser: 0.76 mg/dL (ref 0.57–1.00)
GFR calc Af Amer: 89 mL/min/{1.73_m2} (ref >59)
GFR calc non Af Amer: 77 mL/min/{1.73_m2} (ref >59)
Glucose: 112 mg/dL — ABNORMAL HIGH (ref 65–99)
Potassium: 3.8 mmol/L (ref 3.5–5.2)
Sodium: 142 mmol/L (ref 134–144)

## 2019-08-24 NOTE — Telephone Encounter (Signed)
Patient advised as directed below. 

## 2019-08-24 NOTE — Telephone Encounter (Signed)
-----   Message from Mar Daring, [place] sent at 08/24/2019 10:39 AM EDT ----- Kidney function is stable. No changes with starting amlodipine noted.

## 2019-09-19 ENCOUNTER — Other Ambulatory Visit: Payer: Self-pay | Admitting: Physician Assistant

## 2019-09-19 DIAGNOSIS — N3281 Overactive bladder: Secondary | ICD-10-CM

## 2019-09-19 NOTE — Telephone Encounter (Signed)
Ok to fill 

## 2019-09-20 ENCOUNTER — Other Ambulatory Visit: Payer: Self-pay | Admitting: Physician Assistant

## 2019-09-20 DIAGNOSIS — E119 Type 2 diabetes mellitus without complications: Secondary | ICD-10-CM

## 2019-10-17 ENCOUNTER — Other Ambulatory Visit: Payer: Self-pay | Admitting: Physician Assistant

## 2019-10-17 DIAGNOSIS — F5101 Primary insomnia: Secondary | ICD-10-CM

## 2019-10-17 MED ORDER — ALPRAZOLAM 0.5 MG PO TABS: ORAL_TABLET | ORAL | 1 refills | Status: DC

## 2019-10-17 NOTE — Telephone Encounter (Signed)
Pt needing a refill on:  ALPRAZolam Duanne Moron) 0.5 MG tablet has only 2 left  Please fill at:  [place], [place] [phone] (Phone) [phone] (Fax)   Thanks, [place]

## 2019-10-17 NOTE — Telephone Encounter (Signed)
Jenni's patient. Last OV was on 08/23/2019 and last RF was 03/29/2019 with 5 refills.

## 2019-12-21 ENCOUNTER — Ambulatory Visit: Payer: Medicare Other | Admitting: Physician Assistant

## 2019-12-22 ENCOUNTER — Other Ambulatory Visit: Payer: Self-pay

## 2019-12-22 ENCOUNTER — Ambulatory Visit (INDEPENDENT_AMBULATORY_CARE_PROVIDER_SITE_OTHER): Payer: Medicare Other | Admitting: Physician Assistant

## 2019-12-22 ENCOUNTER — Encounter: Payer: Self-pay | Admitting: Physician Assistant

## 2019-12-22 VITALS — BP 124/68 | HR 65 | Temp 96.9°F | Resp 16 | Wt 214.0 lb

## 2019-12-22 DIAGNOSIS — N3281 Overactive bladder: Secondary | ICD-10-CM | POA: Diagnosis not present

## 2019-12-22 DIAGNOSIS — E034 Atrophy of thyroid (acquired): Secondary | ICD-10-CM

## 2019-12-22 DIAGNOSIS — I10 Essential (primary) hypertension: Secondary | ICD-10-CM | POA: Diagnosis not present

## 2019-12-22 DIAGNOSIS — E1136 Type 2 diabetes mellitus with diabetic cataract: Secondary | ICD-10-CM

## 2019-12-22 MED ORDER — SOLIFENACIN SUCCINATE 5 MG PO TABS: 5.0000 mg | ORAL_TABLET | Freq: Every day | ORAL | 1 refills | Status: DC

## 2019-12-22 NOTE — Progress Notes (Signed)
Patient: Wendy Barnes Female    DOB: [birth year]   79 y.o.   MRN: [id number] Visit Date: 12/22/2019  Today's Provider: Mar Daring, PA-C   Chief Complaint  Patient presents with  . Hypertension  . Diabetes  . Hypothyroidism   Subjective:     HPI  Hypertension, follow-up:  BP Readings from Last 3 Encounters:  12/22/19 124/68  08/23/19 133/79  07/26/19 (!) 156/82    She was last seen for hypertension 4 months ago.  BP at that visit was 133/79. Management since that visit includes no changes. She reports good compliance with treatment. She is not having side effects.  She is not exercising. She is adherent to low salt diet.   Outside blood pressures are not being checked. She is experiencing none.  Patient denies chest pain, chest pressure/discomfort, claudication, dyspnea, exertional chest pressure/discomfort, fatigue, irregular heart beat, lower extremity edema, near-syncope, orthopnea, palpitations, paroxysmal nocturnal dyspnea, syncope and tachypnea.   Cardiovascular risk factors include advanced age (older than 22 for men, 50 for women), diabetes mellitus, dyslipidemia and hypertension.  Use of agents associated with hypertension: thyroid hormones.     Weight trend: increasing steadily Wt Readings from Last 3 Encounters:  12/22/19 214 lb (97.1 kg)  08/23/19 208 lb 3.2 oz (94.4 kg)  07/26/19 210 lb (95.3 kg)    Current diet: well balanced  ------------------------------------------------------------------------  Follow up for Hypothyroidism:  The patient was last seen for this 5 months ago. Changes made at last visit include decreasing Levothyroxine to 27mcg daily.  She reports good compliance with treatment. She feels that condition is Unchanged. She is not having side effects.   ------------------------------------------------------------------------------------  Diabetes Mellitus Type II, Follow-up:   Lab Results  Component Value Date    HGBA1C 6.1 (H) 06/13/2019   HGBA1C 5.8 (H) 04/27/2018   HGBA1C 5.8 09/28/2017    Last seen for diabetes 6 months ago.  Management since then includes no changes. She reports good compliance with treatment. She is not having side effects.  Current symptoms include none and have been stable. Home blood sugar records: blood sugars are not checked  Episodes of hypoglycemia? no   Current insulin regiment: Is not on insulin Most Recent Eye Exam: UTD Weight trend: increasing steadily Prior visit with dietician: No Current exercise: none Current diet habits: well balanced  Pertinent Labs:    Component Value Date/Time   CHOL 205 (H) 06/13/2019 0928   TRIG 127 06/13/2019 0928   HDL 57 06/13/2019 0928   LDLCALC 123 (H) 06/13/2019 0928   CREATININE 0.76 08/23/2019 1413   CREATININE 0.58 (L) 02/22/2014 0632    Wt Readings from Last 3 Encounters:  12/22/19 214 lb (97.1 kg)  08/23/19 208 lb 3.2 oz (94.4 kg)  07/26/19 210 lb (95.3 kg)    ------------------------------------------------------------------------  Allergies  Allergen Reactions  . Amoxicillin Hives and Rash    Has patient had a PCN reaction causing immediate rash, facial/tongue/throat swelling, SOB or lightheadedness with hypotension: Yes Has patient had a PCN reaction causing severe rash involving mucus membranes or skin necrosis: Yes Has patient had a PCN reaction that required hospitalization: No Has patient had a PCN reaction occurring within the last 10 years: Yes If all of the above answers are "NO", then may proceed with Cephalosporin use.   [name] Penicillins Hives and Rash     Current Outpatient Medications:  .  ALPRAZolam (XANAX) 0.5 MG tablet, TAKE ONE-HALF TABLET BY MOUTH AT BEDTIME AS  NEEDED FOR SLEEP, Disp: 45 tablet, Rfl: 1 .  amLODipine (NORVASC) 5 MG tablet, Take 1 tablet (5 mg total) by mouth daily., Disp: 90 tablet, Rfl: 3 .  aspirin 81 MG tablet, Take 81 mg by mouth at bedtime. , Disp: , Rfl:  .   benazepril-hydrochlorthiazide (LOTENSIN HCT) 20-12.5 MG tablet, TAKE ONE TABLET BY MOUTH TWICE A DAY, Disp: 180 tablet, Rfl: 1 .  Biotin 2500 MCG CAPS, Take 2,500 mcg by mouth daily., Disp: , Rfl:  .  Calcium Carbonate-Vitamin D 600-200 MG-UNIT CAPS, Take 1 tablet by mouth daily with supper. , Disp: , Rfl:  .  CRANBERRY CONCENTRATE PO, Take by mouth., Disp: , Rfl:  .  Cranberry-Vitamin C-Vitamin E (CRANBERRY PLUS VITAMIN C) 4200-20-3 MG-MG-UNIT CAPS, Take 1 capsule by mouth daily., Disp: , Rfl:  .  levothyroxine (SYNTHROID) 88 MCG tablet, Take 1 tablet (88 mcg total) by mouth daily., Disp: 90 tablet, Rfl: 3 .  metFORMIN (GLUCOPHAGE) 500 MG tablet, TAKE ONE TABLET BY MOUTH DAILY WITH BREAKFAST, Disp: 90 tablet, Rfl: 2 .  MULTIPLE VITAMIN PO, Take 1 tablet by mouth daily. , Disp: , Rfl:  .  Omega-3 Fatty Acids (FISH OIL ULTRA) 1400 MG CAPS, Take 1,400 mg by mouth daily., Disp: , Rfl:  .  pravastatin (PRAVACHOL) 10 MG tablet, TAKE ONE TABLET BY MOUTH EVERY NIGHT AT BEDTIME, Disp: 90 tablet, Rfl: 2 .  solifenacin (VESICARE) 10 MG tablet, TAKE ONE TABLET BY MOUTH DAILY, Disp: 90 tablet, Rfl: 1  Review of Systems  Constitutional: Negative for appetite change, chills, fatigue and fever.  Respiratory: Negative for chest tightness and shortness of breath.   Cardiovascular: Negative for chest pain and palpitations.  Gastrointestinal: Negative for abdominal pain, nausea and vomiting.  Endocrine: Negative for cold intolerance, heat intolerance, polydipsia, polyphagia and polyuria.  Neurological: Negative for dizziness and weakness.    Social History   Tobacco Use  . Smoking status: Former Smoker    Packs/day: 1.00    Years: 25.00    Pack years: 25.00    Types: Cigarettes    Quit date: 12/15/1995    Years since quitting: 24.0  . Smokeless tobacco: Never Used  . Tobacco comment: quit in 1990's  Substance Use Topics  . Alcohol use: Yes    Alcohol/week: 0.0 standard drinks    Comment:  occasionally - glass of wine      Objective:   BP 124/68 (BP Location: Left Arm, Patient Position: Sitting, Cuff Size: Large)   Pulse 65   Temp (!) 96.9 F (36.1 C) (Temporal)   Resp 16   Wt 214 lb (97.1 kg)   SpO2 98% Comment: room air  BMI 35.07 kg/m  Vitals:   12/22/19 0850  BP: 124/68  Pulse: 65  Resp: 16  Temp: (!) 96.9 F (36.1 C)  TempSrc: Temporal  SpO2: 98%  Weight: 214 lb (97.1 kg)  Body mass index is 35.07 kg/m.   Physical Exam Vitals reviewed.  Constitutional:      General: She is not in acute distress.    Appearance: Normal appearance. She is well-developed. She is obese. She is not ill-appearing or diaphoretic.  HENT:     Head: Normocephalic and atraumatic.     Right Ear: Hearing, tympanic membrane, ear canal and external ear normal.     Left Ear: Hearing, tympanic membrane, ear canal and external ear normal.     Mouth/Throat:     Pharynx: Uvula midline.  Eyes:     General: No  scleral icterus.       Right eye: No discharge.        Left eye: No discharge.     Extraocular Movements: Extraocular movements intact.     Conjunctiva/sclera: Conjunctivae normal.     Pupils: Pupils are equal, round, and reactive to light.  Neck:     Thyroid: No thyromegaly.     Trachea: No tracheal deviation.  Cardiovascular:     Rate and Rhythm: Normal rate and regular rhythm.     Pulses: Normal pulses.     Heart sounds: Normal heart sounds. No murmur. No friction rub. No gallop.   Pulmonary:     Effort: Pulmonary effort is normal. No respiratory distress.     Breath sounds: Normal breath sounds. No stridor. No wheezing or rales.  Musculoskeletal:     Cervical back: Normal range of motion and neck supple.     Right lower leg: No edema.     Left lower leg: No edema.  Lymphadenopathy:     Cervical: No cervical adenopathy.  Skin:    General: Skin is warm and dry.     Capillary Refill: Capillary refill takes less than 2 seconds.  Neurological:     Mental Status: She  is alert.      No results found for any visits on 12/22/19.     Assessment & Plan    1. Type 2 diabetes mellitus with diabetic cataract, without long-term current use of insulin (HCC) Stable. Continue Metformin 500mg  daily. Will check labs as below and f/u pending results. Return in 6 months for AWV.  - HgB A1c  2. Hypothyroidism due to acquired atrophy of thyroid Stable. Continue levothyroxine 16mcg. Will check labs as below and f/u pending results. - TSH  3. OAB (overactive bladder) Doing well using only 1/2 tab of vesicare 10mg . Vesicare 5mg  refilled as below for patient.  - solifenacin (VESICARE) 5 MG tablet; Take 1 tablet (5 mg total) by mouth daily.  Dispense: 90 tablet; Refill: 1  4. Essential hypertension Stable and much improved. Continue Amlodipine 5mg  daily, Benazepril-HCTZ 20-12.5mg  BID.      Mar Daring, PA-C  [facility]

## 2019-12-23 ENCOUNTER — Telehealth: Payer: Self-pay | Admitting: Physician Assistant

## 2019-12-23 ENCOUNTER — Telehealth: Payer: Self-pay

## 2019-12-23 DIAGNOSIS — N3281 Overactive bladder: Secondary | ICD-10-CM

## 2019-12-23 LAB — TSH: TSH: 2.94 u[IU]/mL (ref 0.450–4.500)

## 2019-12-23 LAB — HEMOGLOBIN A1C
Est. average glucose Bld gHb Est-mCnc: 126 mg/dL
Hgb A1c MFr Bld: 6 % — ABNORMAL HIGH (ref 4.8–5.6)

## 2019-12-23 NOTE — Telephone Encounter (Signed)
Pt advised.   Thanks,   -Kawhi Diebold  

## 2019-12-23 NOTE — Telephone Encounter (Signed)
If she gets the goodrx app she can get vesicare 5mg  for $18.56 from [facility].  Would she want to do that before changing or does she wish for me to change? I could change it back to the 10mg  and she can cut in half to extend the Rx.

## 2019-12-23 NOTE — Telephone Encounter (Signed)
Pt stated her rx for solifenacin (VESICARE) 5 MG tablet Was too expensive and a Tier 3 and she would like to know if Anderson Malta could prescribe something in a Tier 2 so she could get something generic and probably cheaper. Please advise.  Please follow up with pt. [place], [place] Phone:  [phone]  Fax:  [phone]

## 2019-12-23 NOTE — Telephone Encounter (Signed)
-----   Message from Mar Daring, [place] sent at 12/23/2019 11:41 AM EST ----- Thyroid is normal. A1c is stable and slightly improved to 6.0 from 6.1 6 months ago.

## 2019-12-26 MED ORDER — OXYBUTYNIN CHLORIDE ER 10 MG PO TB24: 10.0000 mg | ORAL_TABLET | Freq: Every day | ORAL | 1 refills | Status: DC

## 2019-12-26 NOTE — Telephone Encounter (Signed)
LMTCB to make sure her prescription has been able to be filled.

## 2019-12-26 NOTE — Telephone Encounter (Signed)
Patient has been notified about her medication and wil pick them up once they are ready.

## 2019-12-26 NOTE — Telephone Encounter (Signed)
She would like to be placed on something else as she is not good at computers and is unsure about the Good RX. She mentioned Oxybutynin. But she is just concerned about the price and would like to know what you suggest.

## 2019-12-26 NOTE — Telephone Encounter (Signed)
Did anyone ask her the questions below...Wendy KitchenMarland KitchenDoes she want to try the goodrx app and get the vesicare 5mg  for $18 without filing her insirance, does she want the 10mg  and continue splitting in half, or something completely different sent in???????????????????????????

## 2019-12-26 NOTE — Telephone Encounter (Signed)
Oxybutynin is what she was on before vesicare. I will send that back in.

## 2019-12-26 NOTE — Addendum Note (Signed)
Addended by: Mar Daring on: 12/26/2019 03:15 PM   Modules accepted: Orders

## 2019-12-26 NOTE — Telephone Encounter (Signed)
Pt calling back.  States RX has not been called in to her pharmacy yet.

## 2020-01-09 ENCOUNTER — Telehealth: Payer: Self-pay

## 2020-01-09 NOTE — Telephone Encounter (Signed)
Have her stop pravastatin x 2 weeks and call back to let us know if symptoms are improved or not.

## 2020-01-09 NOTE — Telephone Encounter (Signed)
Copied from [place] [id number]. Topic: General - Inquiry >> Jan 09, 2020  9:22 AM Scherrie Gerlach wrote: Reason for CRM: pt states she believes the med pravastatin (PRAVACHOL) 10 MG tablet is causing her to have joint,and leg pain.  No swelling, but finds her legs ache at night. Pt states she has been through this before and has had to change the cholesterol med.  Would like to know what Anderson Malta thinks. Pt just seen 12/22/19 and declined to make appt to discuss.  Would like a call back to advise

## 2020-01-09 NOTE — Addendum Note (Signed)
Addended by: Mar Daring on: 01/09/2020 12:26 PM   Modules accepted: Orders

## 2020-01-09 NOTE — Telephone Encounter (Signed)
Patient advised as directed below. 

## 2020-01-23 ENCOUNTER — Telehealth: Payer: Self-pay

## 2020-01-23 DIAGNOSIS — E7800 Pure hypercholesterolemia, unspecified: Secondary | ICD-10-CM

## 2020-01-23 MED ORDER — ROSUVASTATIN CALCIUM 5 MG PO TABS: 5.0000 mg | ORAL_TABLET | Freq: Every day | ORAL | 3 refills | Status: DC

## 2020-01-23 NOTE — Telephone Encounter (Signed)
Patient advised as directed below. 

## 2020-01-23 NOTE — Telephone Encounter (Signed)
Glad they got better. Will send in Rosuvastatin 5mg  to see how she tolerates this.

## 2020-01-23 NOTE — Telephone Encounter (Signed)
Copied from [place] [id number]. Topic: General - Inquiry >> Jan 23, 2020  8:41 AM Scherrie Gerlach wrote: Reason for CRM: pt was to call back after 2 weeks of beng of prevastatin and let jennifer know how she is doing. Pt states all the aches and pains she was having went away, and does she want to prescribe something else?

## 2020-02-26 ENCOUNTER — Telehealth: Payer: Self-pay | Admitting: Physician Assistant

## 2020-02-26 DIAGNOSIS — I10 Essential (primary) hypertension: Secondary | ICD-10-CM

## 2020-02-26 NOTE — Telephone Encounter (Signed)
Requested Prescriptions  Pending Prescriptions Disp Refills  . benazepril-hydrochlorthiazide (LOTENSIN HCT) 20-12.5 MG tablet [Pharmacy Med Name: BENAZEPRIL-HCTZ 20-12.5 MG TAB] 60 tablet 0    Sig: TAKE ONE TABLET BY MOUTH TWICE A DAY     Cardiovascular:  ACEI + Diuretic Combos Failed - 02/26/2020  6:51 AM      Failed - Na in normal range and within 180 days    Sodium  Date Value Ref Range Status  08/23/2019 142 134 - 144 mmol/L Final  02/22/2014 134 (L) 136 - 145 mmol/L Final         Failed - K in normal range and within 180 days    Potassium  Date Value Ref Range Status  08/23/2019 3.8 3.5 - 5.2 mmol/L Final  02/22/2014 3.9 3.5 - 5.1 mmol/L Final         Failed - Cr in normal range and within 180 days    Creatinine  Date Value Ref Range Status  02/22/2014 0.58 (L) 0.60 - 1.30 mg/dL Final   Creatinine, Ser  Date Value Ref Range Status  08/23/2019 0.76 0.57 - 1.00 mg/dL Final         Failed - Ca in normal range and within 180 days    Calcium  Date Value Ref Range Status  08/23/2019 9.7 8.7 - 10.3 mg/dL Final   Calcium, Total  Date Value Ref Range Status  02/22/2014 8.2 (L) 8.5 - 10.1 mg/dL Final         Passed - Patient is not pregnant      Passed - Last BP in normal range    BP Readings from Last 1 Encounters:  12/22/19 124/68         Passed - Valid encounter within last 6 months    Recent Outpatient Visits          2 months ago Type 2 diabetes mellitus with diabetic cataract, without long-term current use of insulin [facility])   [place], [place], [place]   6 months ago Essential hypertension   [place], [place], [place]   7 months ago Essential hypertension   [facility] Fenton Malling M, [place]   9 months ago Acute cystitis without hematuria   [place], [place], [place]   1 year ago Cough   [facility] [place], Dionne Bucy, [place]              Per Rx refill protocol patient is past due for lab work.

## 2020-02-27 ENCOUNTER — Telehealth: Payer: Self-pay

## 2020-02-27 DIAGNOSIS — I10 Essential (primary) hypertension: Secondary | ICD-10-CM

## 2020-02-27 MED ORDER — BENAZEPRIL-HYDROCHLOROTHIAZIDE 20-12.5 MG PO TABS: 1.0000 | ORAL_TABLET | Freq: Two times a day (BID) | ORAL | 0 refills | Status: DC

## 2020-02-27 NOTE — Telephone Encounter (Signed)
90 day supply sent in 

## 2020-02-27 NOTE — Telephone Encounter (Signed)
FYI

## 2020-02-27 NOTE — Telephone Encounter (Signed)
Copied from [place] [id number]. Topic: General - Other >> Feb 27, 2020 11:18 AM Leward Quan A wrote: Reason for CRM: Patient called to say that her Rx for benazepril-hydrochlorthiazide (LOTENSIN HCT) 20-12.5 MG tablet was sent to pharmacy for a 30 day supply. Per patient she would prefer a 90 day supply like all her other medications and its also cheaper for her. Please advise

## 2020-02-27 NOTE — Addendum Note (Signed)
Addended by: Doristine Devoid on: 02/27/2020 01:52 PM   Modules accepted: Orders

## 2020-05-03 ENCOUNTER — Other Ambulatory Visit: Payer: Self-pay | Admitting: Physician Assistant

## 2020-05-03 DIAGNOSIS — N3281 Overactive bladder: Secondary | ICD-10-CM

## 2020-05-03 DIAGNOSIS — E034 Atrophy of thyroid (acquired): Secondary | ICD-10-CM

## 2020-05-03 DIAGNOSIS — E119 Type 2 diabetes mellitus without complications: Secondary | ICD-10-CM

## 2020-05-03 DIAGNOSIS — I10 Essential (primary) hypertension: Secondary | ICD-10-CM

## 2020-05-03 NOTE — Telephone Encounter (Signed)
Patient has upcoming appointment 06/28/20- RF pass protocol- except for some labs lacking. Please review for lab orders at next visit

## 2020-05-17 ENCOUNTER — Other Ambulatory Visit: Payer: Self-pay | Admitting: Physician Assistant

## 2020-05-17 DIAGNOSIS — F5101 Primary insomnia: Secondary | ICD-10-CM

## 2020-05-17 MED ORDER — ALPRAZOLAM 0.5 MG PO TABS: ORAL_TABLET | ORAL | 1 refills | Status: DC

## 2020-05-17 NOTE — Telephone Encounter (Signed)
Requested medication (s) are due for refill today: no  Requested medication (s) are on the active medication list:yes  Last refill: 10/17/2019  Future visit scheduled: yes  Notes to clinic:  this refill cannot be delegated    Requested Prescriptions  Pending Prescriptions Disp Refills   ALPRAZolam (XANAX) 0.5 MG tablet 45 tablet 1    Sig: TAKE ONE-HALF TABLET BY MOUTH AT BEDTIME AS NEEDED FOR SLEEP      Not Delegated - Psychiatry:  Anxiolytics/Hypnotics Failed - 05/17/2020  8:48 AM      Failed - This refill cannot be delegated      Failed - Urine Drug Screen completed in last 360 days.      Passed - Valid encounter within last 6 months    Recent Outpatient Visits           4 months ago Type 2 diabetes mellitus with diabetic cataract, without long-term current use of insulin [facility])   [place], [place], [place]   8 months ago Essential hypertension   [facility] Fenton Malling M, [place]   9 months ago Essential hypertension   [facility] Fenton Malling M, [place]   11 months ago Acute cystitis without hematuria   [facility], [place], [place]   1 year ago Cough   [facility] [place], Dionne Bucy, [place]

## 2020-05-17 NOTE — Telephone Encounter (Signed)
Medication Refill - Medication: alprazolam   Has the patient contacted their pharmacy? Yes.   (Agent: If no, request that the patient contact the pharmacy for the refill.) (Agent: If yes, when and what did the pharmacy advise?)  Preferred Pharmacy (with phone number or street name):  [place], [address]  [place] [address]  Phone: [phone] Fax: [phone]  Not a 24 hour pharmacy; exact hours not known.     Agent: Please be advised that RX refills may take up to 3 business days. We ask that you follow-up with your pharmacy.

## 2020-05-21 ENCOUNTER — Telehealth: Payer: Self-pay | Admitting: Physician Assistant

## 2020-05-21 DIAGNOSIS — I10 Essential (primary) hypertension: Secondary | ICD-10-CM

## 2020-05-21 MED ORDER — BENAZEPRIL-HYDROCHLOROTHIAZIDE 20-12.5 MG PO TABS: 1.0000 | ORAL_TABLET | Freq: Two times a day (BID) | ORAL | 1 refills | Status: DC

## 2020-05-21 NOTE — Telephone Encounter (Signed)
Medication Refill - Medication:  benazepril-hydrochlorthiazide (LOTENSIN HCT) 20-12.5 MG tablet   Has the patient contacted their pharmacy?  Yes advised to call office. Patient stated it is needed for 2x daily. Pt would like it today. Pt would like to speak with nurse when done.  Preferred Pharmacy (with phone number or street name):  [place], [place] Phone:  [phone]  Fax:  [phone]     Agent: Please be advised that RX refills may take up to 3 business days. We ask that you follow-up with your pharmacy.

## 2020-05-21 NOTE — Telephone Encounter (Signed)
Patient called to say that she is in need of her BP medication the benazepril-hydrochlorthiazide (LOTENSIN HCT) 20-12.5 MG tablet per patient she will be out as of tonight and need this Rx ASAP please

## 2020-05-21 NOTE — Addendum Note (Signed)
Addended by: Ashley Royalty E on: 05/21/2020 04:21 PM   Modules accepted: Orders

## 2020-05-23 ENCOUNTER — Other Ambulatory Visit: Payer: Self-pay | Admitting: Physician Assistant

## 2020-05-23 DIAGNOSIS — Z1231 Encounter for screening mammogram for malignant neoplasm of breast: Secondary | ICD-10-CM

## 2020-06-07 ENCOUNTER — Other Ambulatory Visit: Payer: Self-pay

## 2020-06-07 ENCOUNTER — Encounter: Payer: Self-pay | Admitting: Dermatology

## 2020-06-07 ENCOUNTER — Ambulatory Visit (INDEPENDENT_AMBULATORY_CARE_PROVIDER_SITE_OTHER): Payer: Medicare Other | Admitting: Dermatology

## 2020-06-07 DIAGNOSIS — L300 Nummular dermatitis: Secondary | ICD-10-CM | POA: Diagnosis not present

## 2020-06-07 DIAGNOSIS — L821 Other seborrheic keratosis: Secondary | ICD-10-CM

## 2020-06-07 DIAGNOSIS — Z1283 Encounter for screening for malignant neoplasm of skin: Secondary | ICD-10-CM | POA: Diagnosis not present

## 2020-06-07 DIAGNOSIS — L814 Other melanin hyperpigmentation: Secondary | ICD-10-CM

## 2020-06-07 DIAGNOSIS — L738 Other specified follicular disorders: Secondary | ICD-10-CM

## 2020-06-07 DIAGNOSIS — L820 Inflamed seborrheic keratosis: Secondary | ICD-10-CM

## 2020-06-07 DIAGNOSIS — L578 Other skin changes due to chronic exposure to nonionizing radiation: Secondary | ICD-10-CM

## 2020-06-07 DIAGNOSIS — Z85828 Personal history of other malignant neoplasm of skin: Secondary | ICD-10-CM

## 2020-06-07 DIAGNOSIS — D229 Melanocytic nevi, unspecified: Secondary | ICD-10-CM

## 2020-06-07 DIAGNOSIS — D1800 Hemangioma unspecified site: Secondary | ICD-10-CM

## 2020-06-07 MED ORDER — TRIAMCINOLONE ACETONIDE 0.1 % EX CREA: 1.0000 "application " | TOPICAL_CREAM | CUTANEOUS | 2 refills | Status: DC

## 2020-06-07 NOTE — Progress Notes (Signed)
   Follow-Up Visit   Subjective  Wendy Barnes is a 79 y.o. female who presents for the following: Annual Exam (History of BCC of left temple - TBSE today) and Other (Scaly spot of chest x few months). The patient presents for Total-Body Skin Exam (TBSE) for skin cancer screening and mole check.  The following portions of the chart were reviewed this encounter and updated as appropriate:  Tobacco  Allergies  Meds  Problems  Med Hx  Surg Hx  Fam Hx      Review of Systems:  No other skin or systemic complaints except as noted in HPI or Assessment and Plan.  Objective  Well appearing patient in no apparent distress; mood and affect are within normal limits.  A full examination was performed including scalp, head, eyes, ears, nose, lips, neck, chest, axillae, abdomen, back, buttocks, bilateral upper extremities, bilateral lower extremities, hands, feet, fingers, toes, fingernails, and toenails. All findings within normal limits unless otherwise noted below.  Objective  Scalp: Erythematous keratotic or waxy stuck-on papule or plaque.   Objective  Left Temple: Well healed excision site.  Objective  Chest - Medial [facility]): Hyperpigmented patches with excoriations.   Assessment & Plan    Lentigines - Scattered tan macules - Discussed due to sun exposure - Benign, observe - Call for any changes  Seborrheic Keratoses - Stuck-on, waxy, tan-brown papules and plaques  - Discussed benign etiology and prognosis. - Observe - Call for any changes  Melanocytic Nevi - Tan-brown and/or pink-flesh-colored symmetric macules and papules - Benign appearing on exam today - Observation - Call clinic for new or changing moles - Recommend daily use of broad spectrum spf 30+ sunscreen to sun-exposed areas.   Hemangiomas - Red papules - Discussed benign nature - Observe - Call for any changes  Actinic Damage - diffuse scaly erythematous macules with underlying dyspigmentation -  Recommend daily broad spectrum sunscreen SPF 30+ to sun-exposed areas, reapply every 2 hours as needed.  - Call for new or changing lesions.  Skin cancer screening performed today.  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 Observe for recurrence. Call clinic for new or changing lesions.  Recommend regular skin exams, daily broad-spectrum spf 30+ sunscreen use, and photoprotection.   Clear. Observe  Nummular dermatitis Chest - Medial (Center)  triamcinolone cream (KENALOG) 0.1 % - Chest - Medial [facility])  Return in about 1 year (around 06/07/2021).  I, Ashok Cordia, CMA, am acting as scribe for Sarina Ser, MD .  Documentation: I have reviewed the above documentation for accuracy and completeness, and I agree with the above.  Sarina Ser, MD

## 2020-06-07 NOTE — Patient Instructions (Signed)

## 2020-06-13 ENCOUNTER — Encounter: Payer: Self-pay | Admitting: Dermatology

## 2020-06-22 ENCOUNTER — Telehealth: Payer: Self-pay

## 2020-06-22 ENCOUNTER — Ambulatory Visit
Admission: RE | Admit: 2020-06-22 | Discharge: 2020-06-22 | Disposition: A | Discharge status: Home / Self Care | Payer: Medicare Other | Source: Ambulatory Visit | Attending: Physician Assistant | Admitting: Physician Assistant

## 2020-06-22 DIAGNOSIS — Z1231 Encounter for screening mammogram for malignant neoplasm of breast: Secondary | ICD-10-CM

## 2020-06-22 NOTE — Telephone Encounter (Signed)
-----   Message from Mar Daring, PA-C sent at 06/22/2020  2:37 PM EDT ----- Normal mammogram. Repeat screening in one year.

## 2020-06-22 NOTE — Telephone Encounter (Signed)
Tried calling patient, Left message to call back. OK for [facility] triage to advise of results.

## 2020-06-22 NOTE — Telephone Encounter (Signed)
Patient calling for results. Results given from Fenton Malling, PA-C on 06/22/20. Normal mammogram. Repeat screening in one year. patient verbalized understanding.

## 2020-06-26 NOTE — Progress Notes (Signed)
Annual Wellness Visit     Patient: Wendy Barnes, Female    DOB: Dec 14, 1941, 79 y.o.   MRN: [id number] Visit Date: 06/28/2020  I,Sulibeya S Dimas,acting as a scribe for [employer], PA-C.,have documented all relevant documentation on the behalf of Mar Daring, PA-C,as directed by  Mar Daring, PA-C while in the presence of Mar Daring, PA-C.  Today's Provider: Mar Daring, PA-C   Chief Complaint  Patient presents with   Medicare Wellness   Subjective    Wendy Barnes is a 79 y.o. female who presents today for her Annual Wellness Visit. She reports consuming a general diet. The patient does not participate in regular exercise at present. She generally feels well. She reports sleeping fairly well. She does not have additional problems to discuss today.   HPI 06/22/2020 Mammogram-BI-RADS 1 05/09/2016 BMD-Normal 04/24/2016 Colonoscopy-polyps, Tubular Adenoma  Patient Active Problem List   Diagnosis Date Noted   Hypothyroidism 02/11/2018   Osteoarthritis of spine with radiculopathy, lumbar region 04/16/2016   Status post total right knee replacement 02/24/2016   Colon polyp 05/16/2015   Dysfunction of eustachian tube 05/16/2015   Gonalgia 05/16/2015   Plantar fasciitis 05/16/2015   Detrusor muscle hypertonia 05/16/2015   Vaginal lesion 05/16/2015   History of diverticulitis of colon 05/16/2015   Burning or prickling sensation 04/10/2010   Cannot sleep 12/10/2007   Dermatologic disease 09/15/2007   BP (high blood pressure) 08/28/2006   Hypercholesteremia 08/28/2006   Mixed incontinence 08/28/2006   Past Medical History:  Diagnosis Date   Anxiety    Cancer ([place])    skin   Hyperchloremia    Hypertension    Hypothyroidism    Squamous cell carcinoma of skin 09/18/2008   left temple   T2DM (type 2 diabetes mellitus) ([place])    Thyroid disease    Varicella    Vertigo    Past Surgical History:    Procedure Laterality Date   CATARACT EXTRACTION W/PHACO Left 11/17/2017   Procedure: CATARACT EXTRACTION PHACO AND INTRAOCULAR LENS PLACEMENT (IOC)-LEFT DIABETIC;  Surgeon: Birder Robson, MD;  Location: [facility] ORS;  Service: Ophthalmology;  Laterality: Left;  [place] 00:36 AP% 15.1 CDE 5.46 Fluid pack lot # [id number]   CATARACT EXTRACTION W/PHACO Right 12/22/2017   Procedure: CATARACT EXTRACTION PHACO AND INTRAOCULAR LENS PLACEMENT (IOC);  Surgeon: Birder Robson, MD;  Location: [facility] ORS;  Service: Ophthalmology;  Laterality: Right;  [place] 00:46.1 AP% 12.9 CDE 5.97 Fluid pack lot # [id number]   COLONOSCOPY WITH PROPOFOL N/A 04/24/2016   Procedure: COLONOSCOPY WITH PROPOFOL;  Surgeon: Manya Silvas, MD;  Location: [facility] ENDOSCOPY;  Service: Endoscopy;  Laterality: N/A;   DILATION AND CURETTAGE OF UTERUS  2002   JOINT REPLACEMENT Right    REPLACEMENT TOTAL KNEE Right 02/20/2014   THYROIDECTOMY  2010   TONSILLECTOMY     TONSILLECTOMY AND ADENOIDECTOMY  1950   TOTAL KNEE ARTHROPLASTY  2015   right knee   Social History   Socioeconomic History   Marital status: Widowed    Spouse name: Not on file   Number of children: 4   Years of education: Not on file   Highest education level: 12th grade  Occupational History   Occupation: retired  Tobacco Use   Smoking status: Former Smoker    Packs/day: 1.00    Years: 25.00    Pack years: 25.00    Types: Cigarettes    Quit date: 12/15/1995    Years since quitting: 24.5  Smokeless tobacco: Never Used   Tobacco comment: quit in 1990's  Vaping Use   Vaping Use: Never used  Substance and Sexual Activity   Alcohol use: Yes    Alcohol/week: 0.0 standard drinks    Comment: occasionally - glass of wine   Drug use: No   Sexual activity: Not on file  Other Topics Concern   Not on file  Social History Narrative   Not on file   Social Determinants of Health   Financial Resource Strain:    Difficulty of Paying Living  Expenses:   Food Insecurity:    Worried About Charity fundraiser in the Last Year:    Arboriculturist in the Last Year:   Transportation Needs:    Film/video editor (Medical):    Lack of Transportation (Non-Medical):   Physical Activity:    Days of Exercise per Week:    Minutes of Exercise per Session:   Stress:    Feeling of Stress :   Social Connections:    Frequency of Communication with Friends and Family:    Frequency of Social Gatherings with Friends and Family:    Attends Religious Services:    Active Member of Clubs or Organizations:    Attends Music therapist:    Marital Status:   Intimate Partner Violence:    Fear of Current or Ex-Partner:    Emotionally Abused:    Physically Abused:    Sexually Abused:    Family History  Problem Relation Age of Onset   Stroke Father    Breast cancer Neg Hx    Allergies  Allergen Reactions   Amoxicillin Hives and Rash    Has patient had a PCN reaction causing immediate rash, facial/tongue/throat swelling, SOB or lightheadedness with hypotension: Yes Has patient had a PCN reaction causing severe rash involving mucus membranes or skin necrosis: Yes Has patient had a PCN reaction that required hospitalization: No Has patient had a PCN reaction occurring within the last 10 years: Yes If all of the above answers are "NO", then may proceed with Cephalosporin use.    Lovastatin Other (See Comments)    myalgias   Penicillins Hives and Rash   Pravastatin Other (See Comments)    Myalgias       Medications: Outpatient Medications Prior to Visit  Medication Sig   ALPRAZolam (XANAX) 0.5 MG tablet TAKE ONE-HALF TABLET BY MOUTH AT BEDTIME AS NEEDED FOR SLEEP   aspirin 81 MG tablet Take 81 mg by mouth at bedtime.    benazepril-hydrochlorthiazide (LOTENSIN HCT) 20-12.5 MG tablet Take 1 tablet by mouth 2 (two) times daily.   Biotin 2500 MCG CAPS Take 2,500 mcg by mouth daily.   Calcium  Carbonate-Vitamin D 600-200 MG-UNIT CAPS Take 1 tablet by mouth daily with supper.    CRANBERRY CONCENTRATE PO Take by mouth.   Cranberry-Vitamin C-Vitamin E (CRANBERRY PLUS VITAMIN C) 4200-20-3 MG-MG-UNIT CAPS Take 1 capsule by mouth daily.   MULTIPLE VITAMIN PO Take 1 tablet by mouth daily.    Omega-3 Fatty Acids (FISH OIL ULTRA) 1400 MG CAPS Take 1,400 mg by mouth daily.   rosuvastatin (CRESTOR) 5 MG tablet Take 1 tablet (5 mg total) by mouth daily.   triamcinolone cream (KENALOG) 0.1 % Apply 1 application topically as directed. BID for 2 weeks then decrease to qd 5 times per week. Avoid face, groin, underarms.   [DISCONTINUED] amLODipine (NORVASC) 5 MG tablet TAKE ONE TABLET BY MOUTH DAILY   [DISCONTINUED] levothyroxine (  SYNTHROID) 88 MCG tablet TAKE ONE TABLET BY MOUTH DAILY   [DISCONTINUED] metFORMIN (GLUCOPHAGE) 500 MG tablet TAKE ONE TABLET BY MOUTH DAILY WITH BREAKFAST   [DISCONTINUED] oxybutynin (DITROPAN-XL) 10 MG 24 hr tablet TAKE ONE TABLET BY MOUTH EVERY NIGHT AT BEDTIME   No facility-administered medications prior to visit.    Allergies  Allergen Reactions   Amoxicillin Hives and Rash    Has patient had a PCN reaction causing immediate rash, facial/tongue/throat swelling, SOB or lightheadedness with hypotension: Yes Has patient had a PCN reaction causing severe rash involving mucus membranes or skin necrosis: Yes Has patient had a PCN reaction that required hospitalization: No Has patient had a PCN reaction occurring within the last 10 years: Yes If all of the above answers are "NO", then may proceed with Cephalosporin use.    Lovastatin Other (See Comments)    myalgias   Penicillins Hives and Rash   Pravastatin Other (See Comments)    Myalgias    Patient Care Team: Mar Daring, PA-C as PCP - General (Family Medicine) Birder Robson, MD as Referring Physician (Ophthalmology) Ralene Bathe, MD as Consulting Physician (Dermatology) Marry Guan,  Laurice Record, MD as Consulting Physician (Orthopedic Surgery)  Review of Systems  Constitutional: Negative.   HENT: Negative.   Eyes: Negative.   Respiratory: Negative.   Cardiovascular: Negative.   Gastrointestinal: Negative.   Endocrine: Negative.   Genitourinary: Negative.   Musculoskeletal: Negative.   Skin: Negative.   Allergic/Immunologic: Negative.   Neurological: Negative.   Hematological: Negative.   Psychiatric/Behavioral: Negative.     Last CBC Lab Results  Component Value Date   WBC 8.9 06/13/2019   HGB 13.2 06/13/2019   HCT 40.0 06/13/2019   MCV 86 06/13/2019   MCH 28.3 06/13/2019   RDW 13.6 06/13/2019   PLT 231 18/56/3149   Last metabolic panel Lab Results  Component Value Date   GLUCOSE 112 (H) 08/23/2019   NA 142 08/23/2019   K 3.8 08/23/2019   CL 103 08/23/2019   CO2 23 08/23/2019   BUN 14 08/23/2019   CREATININE 0.76 08/23/2019   GFRNONAA 77 08/23/2019   GFRAA 89 08/23/2019   CALCIUM 9.7 08/23/2019   PROT 6.9 06/13/2019   ALBUMIN 4.5 06/13/2019   LABGLOB 2.4 06/13/2019   AGRATIO 1.9 06/13/2019   BILITOT 0.4 06/13/2019   ALKPHOS 87 06/13/2019   AST 18 06/13/2019   ALT 21 06/13/2019   ANIONGAP 6 (L) 02/22/2014   Last lipids Lab Results  Component Value Date   CHOL 205 (H) 06/13/2019   HDL 57 06/13/2019   LDLCALC 123 (H) 06/13/2019   TRIG 127 06/13/2019   CHOLHDL 3.6 06/13/2019   Last hemoglobin A1c Lab Results  Component Value Date   HGBA1C 6.0 (H) 12/22/2019   Last thyroid functions Lab Results  Component Value Date   TSH 2.940 12/22/2019      Objective    Vitals: BP 125/71 (BP Location: Left Arm, Patient Position: Sitting, Cuff Size: Large)    Pulse (!) 59    Temp (!) 97.1 F (36.2 C) (Temporal)    Resp 16    Ht 5\' 4"  (1.626 m)    Wt 211 lb 12.8 oz (96.1 kg)    BMI 36.36 kg/m  BP Readings from Last 3 Encounters:  06/28/20 125/71  12/22/19 124/68  08/23/19 133/79   Wt Readings from Last 3 Encounters:  06/28/20 211 lb 12.8  oz (96.1 kg)  12/22/19 214 lb (97.1 kg)  08/23/19 208  lb 3.2 oz (94.4 kg)      Physical Exam Vitals reviewed.  Constitutional:      General: She is not in acute distress.    Appearance: Normal appearance. She is well-developed. She is obese. She is not ill-appearing or diaphoretic.  HENT:     Head: Normocephalic and atraumatic.     Right Ear: Tympanic membrane, ear canal and external ear normal.     Left Ear: Tympanic membrane, ear canal and external ear normal.     Nose: Nose normal.     Mouth/Throat:     Mouth: Mucous membranes are moist.     Pharynx: Oropharynx is clear. No oropharyngeal exudate.  Eyes:     General: No scleral icterus.       Right eye: Hordeolum present. No discharge.        Left eye: No discharge.     Extraocular Movements: Extraocular movements intact.     Conjunctiva/sclera: Conjunctivae normal.     Pupils: Pupils are equal, round, and reactive to light.  Neck:     Thyroid: No thyromegaly.     Vascular: No carotid bruit.  Cardiovascular:     Rate and Rhythm: Normal rate and regular rhythm.     Pulses: Normal pulses.     Heart sounds: Normal heart sounds. No murmur heard.   Pulmonary:     Effort: Pulmonary effort is normal. No respiratory distress.     Breath sounds: Normal breath sounds. No wheezing or rales.  Abdominal:     General: Abdomen is flat. There is no distension.     Palpations: Abdomen is soft.     Tenderness: There is no abdominal tenderness.     Hernia: No hernia is present.  Musculoskeletal:        General: No deformity.     Cervical back: Normal range of motion and neck supple.     Right lower leg: No edema.     Left lower leg: No edema.  Feet:     Right foot:     Protective Sensation: 10 sites tested. 10 sites sensed.     Left foot:     Protective Sensation: 10 sites tested.  Lymphadenopathy:     Cervical: No cervical adenopathy.  Skin:    General: Skin is warm and dry.     Capillary Refill: Capillary refill takes less  than 2 seconds.     Findings: No rash.  Neurological:     General: No focal deficit present.     Mental Status: She is alert and oriented to person, place, and time. Mental status is at baseline.     Sensory: No sensory deficit.     Motor: No weakness.     Gait: Gait normal.  Psychiatric:        Mood and Affect: Mood normal.        Behavior: Behavior normal.        Thought Content: Thought content normal.        Judgment: Judgment normal.    Diabetic Foot Form - Detailed   Diabetic Foot Exam - detailed Diabetic Foot exam was performed with the following findings: Yes 06/28/2020  9:35 AM  Visual Foot Exam completed.: Yes  Pulse Foot Exam completed.: Yes  Sensory Foot Exam Completed.: Yes Semmes-Weinstein Monofilament Test       Most recent functional status assessment: In your present state of health, do you have any difficulty performing the following activities: 06/28/2020  Hearing? N  Vision? N  Difficulty concentrating or making decisions? N  Walking or climbing stairs? N  Dressing or bathing? N  Doing errands, shopping? N  Some recent data might be hidden   Most recent fall risk assessment: Fall Risk  06/28/2020  Falls in the past year? 0  Number falls in past yr: 0  Injury with Fall? 0  Risk for fall due to : No Fall Risks    Most recent depression screenings: PHQ 2/9 Scores 06/28/2020 06/10/2019  PHQ - 2 Score 0 0  PHQ- 9 Score 0 0   Most recent cognitive screening: 6CIT Screen 04/17/2017  What Year? 0 points  What month? 0 points  What time? 0 points  Count back from 20 0 points  Months in reverse 0 points  Repeat phrase 4 points  Total Score 4   Most recent Audit-C alcohol use screening Alcohol Use Disorder Test (AUDIT) 06/28/2020  1. How often do you have a drink containing alcohol? 1  2. How many drinks containing alcohol do you have on a typical day when you are drinking? 0  3. How often do you have six or more drinks on one occasion? 0  AUDIT-C Score  1  Alcohol Brief Interventions/Follow-up AUDIT Score <7 follow-up not indicated   A score of 3 or more in women, and 4 or more in men indicates increased risk for alcohol abuse, EXCEPT if all of the points are from question 1   No results found for any visits on 06/28/20.  Assessment & Plan     Annual wellness visit done today including the all of the following: Reviewed patient's Family Medical History Reviewed and updated list of patient's medical providers Assessment of cognitive impairment was done Assessed patient's functional ability Established a written schedule for health screening [place] Completed and Reviewed  Exercise Activities and Dietary recommendations Goals     DIET - REDUCE FAT INTAKE     Recommend cutting out all fried foods to help aid in weight loss.      Reduce portion size     Recommend to work on portion control for daily meals.        Immunization History  Administered Date(s) Administered   Fluad Quad(high Dose 65+) 08/23/2019   Influenza, High Dose Seasonal PF 10/16/2015, 08/20/2016, 09/28/2017, 10/16/2018   PFIZER SARS-COV-2 Vaccination 01/16/2020, 02/06/2020   Pneumococcal Conjugate-13 10/30/2014   Pneumococcal Polysaccharide-23 01/02/2010   Zoster 02/08/2009    Health Maintenance  Topic Date Due   Hepatitis C Screening  Never done   TETANUS/TDAP  Never done   HEMOGLOBIN A1C  06/20/2020   INFLUENZA VACCINE  07/15/2020   OPHTHALMOLOGY EXAM  07/20/2020   FOOT EXAM  06/28/2021   DEXA SCAN  Completed   COVID-19 Vaccine  Completed   PNA vac Low Risk Adult  Completed     Discussed health benefits of physical activity, and encouraged her to engage in regular exercise appropriate for her age and condition.    1. Medicare annual wellness visit, subsequent Normal exam. Up to date on screenings and vaccinations.   2. Essential hypertension Stable. Diagnosis pulled for medication refill. Continue  current medical treatment plan. Will check labs as below and f/u pending results. - Comprehensive metabolic panel - amLODipine (NORVASC) 5 MG tablet; Take 1 tablet (5 mg total) by mouth daily.  Dispense: 90 tablet; Refill: 1  3. Hypercholesterolemia Stable. Continue Rosuvastatin 5mg . Will check labs as below and f/u pending results. - Lipid Panel With LDL/HDL  Ratio  4. Hypothyroidism due to acquired atrophy of thyroid Stable. Diagnosis pulled for medication refill. Continue current medical treatment plan. Will check labs as below and f/u pending results. - TSH - levothyroxine (SYNTHROID) 88 MCG tablet; Take 1 tablet (88 mcg total) by mouth daily.  Dispense: 90 tablet; Refill: 1  5. Need for hepatitis C screening test Will check labs as below and f/u pending results. - Hepatitis C antibody  6. OAB (overactive bladder) Stable. Diagnosis pulled for medication refill. Continue current medical treatment plan. - oxybutynin (DITROPAN-XL) 10 MG 24 hr tablet; Take 1 tablet (10 mg total) by mouth at bedtime.  Dispense: 90 tablet; Refill: 1  7. Type 2 diabetes mellitus with diabetic polyneuropathy, without long-term current use of insulin (HCC) Stable. Diagnosis pulled for medication refill. Continue current medical treatment plan. Will check labs as below and f/u pending results. - Hemoglobin A1c - metFORMIN (GLUCOPHAGE) 500 MG tablet; Take 1 tablet (500 mg total) by mouth daily with breakfast.  Dispense: 90 tablet; Refill: 1   Return in about 6 months (around 12/29/2020) for chronic disease f/u.     Reynolds Bowl, PA-C, have reviewed all documentation for this visit. The documentation on 06/28/20 for the exam, diagnosis, procedures, and orders are all accurate and complete.   Rubye Beach  [facility] [phone] (phone) [phone] (fax)  [place]

## 2020-06-28 ENCOUNTER — Encounter: Payer: Self-pay | Admitting: Physician Assistant

## 2020-06-28 ENCOUNTER — Ambulatory Visit (INDEPENDENT_AMBULATORY_CARE_PROVIDER_SITE_OTHER): Payer: Medicare Other | Admitting: Physician Assistant

## 2020-06-28 ENCOUNTER — Other Ambulatory Visit: Payer: Self-pay

## 2020-06-28 VITALS — BP 125/71 | HR 59 | Temp 97.1°F | Resp 16 | Ht 64.0 in | Wt 211.8 lb

## 2020-06-28 DIAGNOSIS — N3281 Overactive bladder: Secondary | ICD-10-CM

## 2020-06-28 DIAGNOSIS — E7800 Pure hypercholesterolemia, unspecified: Secondary | ICD-10-CM

## 2020-06-28 DIAGNOSIS — I10 Essential (primary) hypertension: Secondary | ICD-10-CM | POA: Diagnosis not present

## 2020-06-28 DIAGNOSIS — E034 Atrophy of thyroid (acquired): Secondary | ICD-10-CM

## 2020-06-28 DIAGNOSIS — E1142 Type 2 diabetes mellitus with diabetic polyneuropathy: Secondary | ICD-10-CM | POA: Diagnosis not present

## 2020-06-28 DIAGNOSIS — Z1159 Encounter for screening for other viral diseases: Secondary | ICD-10-CM

## 2020-06-28 DIAGNOSIS — Z0000 Encounter for general adult medical examination without abnormal findings: Secondary | ICD-10-CM

## 2020-06-28 MED ORDER — AMLODIPINE BESYLATE 5 MG PO TABS: 5.0000 mg | ORAL_TABLET | Freq: Every day | ORAL | 1 refills | Status: DC

## 2020-06-28 MED ORDER — LEVOTHYROXINE SODIUM 88 MCG PO TABS: 88.0000 ug | ORAL_TABLET | Freq: Every day | ORAL | 1 refills | Status: DC

## 2020-06-28 MED ORDER — METFORMIN HCL 500 MG PO TABS: 500.0000 mg | ORAL_TABLET | Freq: Every day | ORAL | 1 refills | Status: DC

## 2020-06-28 MED ORDER — OXYBUTYNIN CHLORIDE ER 10 MG PO TB24: 10.0000 mg | ORAL_TABLET | Freq: Every day | ORAL | 1 refills | Status: DC

## 2020-06-28 NOTE — Patient Instructions (Addendum)
Diabetic Neuropathy Diabetic neuropathy refers to nerve damage that is caused by diabetes (diabetes mellitus). Over time, people with diabetes can develop nerve damage throughout the body. There are several types of diabetic neuropathy:  Peripheral neuropathy. This is the most common type of diabetic neuropathy. It causes damage to nerves that carry signals between the spinal cord and other parts of the body (peripheral nerves). This usually affects nerves in the feet and legs first, and may eventually affect the hands and arms. The damage affects the ability to sense touch or temperature.  Autonomic neuropathy. This type causes damage to nerves that control involuntary functions (autonomic nerves). These nerves carry signals that control: ? Heartbeat. ? Body temperature. ? Blood pressure. ? Urination. ? Digestion. ? Sweating. ? Sexual function. ? Response to changing blood sugar (glucose) levels.  Focal neuropathy. This type of nerve damage affects one area of the body, such as an arm, a leg, or the face. The injury may involve one nerve or a small group of nerves. Focal neuropathy can be painful and unpredictable, and occurs most often in older adults with diabetes. This often develops suddenly, but usually improves over time and does not cause long-term problems.  Proximal neuropathy. This type of nerve damage affects the nerves of the thighs, hips, buttocks, or legs. It causes severe pain, weakness, and muscle death (atrophy), usually in the thigh muscles. It is more common among older men and people who have type 2 diabetes. The length of recovery time may vary. What are the causes? Peripheral, autonomic, and focal neuropathies are caused by diabetes that is not well controlled with treatment. The cause of proximal neuropathy is not known, but it may be caused by inflammation related to uncontrolled blood glucose levels. What are the signs or symptoms? Peripheral neuropathy Peripheral  neuropathy develops slowly over time. When the nerves of the feet and legs no longer work, you may experience:  Burning, stabbing, or aching pain in the legs or feet.  Pain or cramping in the legs or feet.  Loss of feeling (numbness) and inability to feel pressure or pain in the feet. This can lead to: ? Thick calluses or sores on areas of constant pressure. ? Ulcers. ? Reduced ability to feel temperature changes.  Foot deformities.  Muscle weakness.  Loss of balance or coordination. Autonomic neuropathy The symptoms of autonomic neuropathy vary depending on which nerves are affected. Symptoms may include:  Problems with digestion, such as: ? Nausea or vomiting. ? Poor appetite. ? Bloating. ? Diarrhea or constipation. ? Trouble swallowing. ? Losing weight without trying to.  Problems with the heart, blood and lungs, such as: ? Dizziness, especially when standing up. ? Fainting. ? Shortness of breath. ? Irregular heartbeat.  Bladder problems, such as: ? Trouble starting or stopping urination. ? Leaking urine. ? Trouble emptying the bladder. ? Urinary tract infections (UTIs).  Problems with other body functions, such as: ? Sweat. You may sweat too much or too little. ? Temperature. You might get hot easily. Or, you might feel cold more than usual. ? Sexual function. Men may not be able to get or maintain an erection. Women may have vaginal dryness and difficulty with arousal. Focal neuropathy Symptoms affect only one area of the body. Common symptoms include:  Numbness.  Tingling.  Burning pain.  Prickling feeling.  Very sensitive skin.  Weakness.  Inability to move (paralysis).  Muscle twitching.  Muscles getting smaller (wasting).  Poor coordination.  Double or blurred vision. Proximal  neuropathy  Sudden, severe pain in the hip, thigh, or buttocks. Pain may spread from the back into the legs (sciatica).  Pain and numbness in the arms and  legs.  Tingling.  Loss of bladder control or bowel control.  Weakness and wasting of thigh muscles.  Difficulty getting up from a seated position.  Abdominal swelling.  Unexplained weight loss. How is this diagnosed? Diagnosis usually involves reviewing your medical history and any symptoms you have. Diagnosis varies depending on the type of neuropathy your health care provider suspects. Peripheral neuropathy Your health care provider will check areas that are affected by your nervous system (neurologic exam), such as your reflexes, how you move, and what you can feel. You may have other tests, such as:  Blood tests.  Removal and examination of fluid that surrounds the spinal cord (lumbar puncture).  CT scan.  MRI.  A test to check the nerves that control muscles (electromyogram, EMG).  Tests of how quickly messages pass through your nerves (nerve conduction velocity tests).  Removal of a small piece of nerve to be examined under a microscope (biopsy). Autonomic neuropathy You may have tests, such as:  Tests to measure your blood pressure and heart rate. This may include monitoring you while you are safely secured to an exam table that moves you from a lying position to an upright position (table tilt test).  Breathing tests to check your lungs.  Tests to check how food moves through the digestive system (gastric emptying tests).  Blood, sweat, or urine tests.  Ultrasound of your bladder.  Spinal fluid tests. Focal neuropathy This condition may be diagnosed with:  A neurologic exam.  CT scan.  MRI.  EMG.  Nerve conduction velocity tests. Proximal neuropathy There is no test to diagnose this type of neuropathy. You may have tests to rule out other possible causes of this type of neuropathy. Tests may include:  X-rays of your spine and lumbar region.  Lumbar puncture.  MRI. How is this treated? The goal of treatment is to keep nerve damage from getting  worse. The most important part of treatment is keeping your blood glucose level and your A1C level within your target range by following your diabetes management plan. Over time, maintaining lower blood glucose levels helps lessen symptoms. In some cases, you may need prescription pain medicine. Follow these instructions at home:  Lifestyle   Do not use any products that contain nicotine or tobacco, such as cigarettes and e-cigarettes. If you need help quitting, ask your health care provider.  Be physically active every day. Include strength training and balance exercises.  Follow a healthy meal plan.  Work with your health care provider to manage your blood pressure. General instructions  Follow your diabetes management plan as directed. ? Check your blood glucose levels as directed by your health care provider. ? Keep your blood glucose in your target range as directed by your health care provider. ? Have your A1C level checked at least two times a year, or as often as told by your health care provider.  Take over the counter and prescription medicines only as told by your health care provider. This includes insulin and diabetes medicine.  Do not drive or use heavy machinery while taking prescription pain medicines.  Check your skin and feet every day for cuts, bruises, redness, blisters, or sores.  Keep all follow up visits as told by your health care provider. This is important. Contact a health care provider if:  You have burning, stabbing, or aching pain in your legs or feet.  You are unable to feel pressure or pain in your feet.  You develop problems with digestion, such as: ? Nausea. ? Vomiting. ? Bloating. ? Constipation. ? Diarrhea. ? Abdominal pain.  You have difficulty with urination, such as inability: ? To control when you urinate (incontinence). ? To completely empty the bladder (retention).  You have palpitations.  You feel dizzy, weak, or faint when you  stand up. Get help right away if:  You cannot urinate.  You have sudden weakness or loss of coordination.  You have trouble speaking.  You have pain or pressure in your chest.  You have an irregular heart beat.  You have sudden inability to move a part of your body. Summary  Diabetic neuropathy refers to nerve damage that is caused by diabetes. It can affect nerves throughout the entire body, causing numbness and pain in the arms, legs, digestive tract, heart, and other body systems.  Keep your blood glucose level and your blood pressure in your target range, as directed by your health care provider. This can help prevent neuropathy from getting worse.  Check your skin and feet every day for cuts, bruises, redness, blisters, or sores.  Do not use any products that contain nicotine or tobacco, such as cigarettes and e-cigarettes. If you need help quitting, ask your health care provider. This information is not intended to replace advice given to you by your health care provider. Make sure you discuss any questions you have with your health care provider. Document Revised: 01/13/2018 Document Reviewed: 01/05/2017 Elsevier Patient Education  2020 [place] 65 Years and Older, Female Preventive care refers to lifestyle choices and visits with your health care provider that can promote health and wellness. This includes:  A yearly physical exam. This is also called an annual well check.  Regular dental and eye exams.  Immunizations.  Screening for certain conditions.  Healthy lifestyle choices, such as diet and exercise. What can I expect for my preventive care visit? Physical exam Your health care provider will check:  Height and weight. These may be used to calculate body mass index (BMI), which is a measurement that tells if you are at a healthy weight.  Heart rate and blood pressure.  Your skin for abnormal spots. Counseling Your health care  provider may ask you questions about:  Alcohol, tobacco, and drug use.  Emotional well-being.  Home and relationship well-being.  Sexual activity.  Eating habits.  History of falls.  Memory and ability to understand (cognition).  Work and work Statistician.  Pregnancy and menstrual history. What immunizations do I need?  Influenza (flu) vaccine  This is recommended every year. Tetanus, diphtheria, and pertussis (Tdap) vaccine  You may need a Td booster every 10 years. Varicella (chickenpox) vaccine  You may need this vaccine if you have not already been vaccinated. Zoster (shingles) vaccine  You may need this after age 63. Pneumococcal conjugate (PCV13) vaccine  One dose is recommended after age 82. Pneumococcal polysaccharide (PPSV23) vaccine  One dose is recommended after age 47. Measles, mumps, and rubella (MMR) vaccine  You may need at least one dose of MMR if you were born in 1957 or later. You may also need a second dose. Meningococcal conjugate (MenACWY) vaccine  You may need this if you have certain conditions. Hepatitis A vaccine  You may need this if you have certain conditions or if you travel  or work in places where you may be exposed to hepatitis A. Hepatitis B vaccine  You may need this if you have certain conditions or if you travel or work in places where you may be exposed to hepatitis B. Haemophilus influenzae type b (Hib) vaccine  You may need this if you have certain conditions. You may receive vaccines as individual doses or as more than one vaccine together in one shot (combination vaccines). Talk with your health care provider about the risks and benefits of combination vaccines. What tests do I need? Blood tests  Lipid and cholesterol levels. These may be checked every 5 years, or more frequently depending on your overall health.  Hepatitis C test.  Hepatitis B test. Screening  Lung cancer screening. You may have this screening  every year starting at age 60 if you have a 30-pack-year history of smoking and currently smoke or have quit within the past 15 years.  Colorectal cancer screening. All adults should have this screening starting at age 83 and continuing until age 22. Your health care provider may recommend screening at age 52 if you are at increased risk. You will have tests every 1-10 years, depending on your results and the type of screening test.  Diabetes screening. This is done by checking your blood sugar (glucose) after you have not eaten for a while (fasting). You may have this done every 1-3 years.  Mammogram. This may be done every 1-2 years. Talk with your health care provider about how often you should have regular mammograms.  BRCA-related cancer screening. This may be done if you have a family history of breast, ovarian, tubal, or peritoneal cancers. Other tests  Sexually transmitted disease (STD) testing.  Bone density scan. This is done to screen for osteoporosis. You may have this done starting at age 37. Follow these instructions at home: Eating and drinking  Eat a diet that includes fresh fruits and vegetables, whole grains, lean protein, and low-fat dairy products. Limit your intake of foods with high amounts of sugar, saturated fats, and salt.  Take vitamin and mineral supplements as recommended by your health care provider.  Do not drink alcohol if your health care provider tells you not to drink.  If you drink alcohol: ? Limit how much you have to 0-1 drink a day. ? Be aware of how much alcohol is in your drink. In the [place] one drink equals one 12 oz bottle of beer (355 mL), one 5 oz glass of wine (148 mL), or one 1 oz glass of hard liquor (44 mL). Lifestyle  Take daily care of your teeth and gums.  Stay active. Exercise for at least 30 minutes on 5 or more days each week.  Do not use any products that contain nicotine or tobacco, such as cigarettes, e-cigarettes, and chewing  tobacco. If you need help quitting, ask your health care provider.  If you are sexually active, practice safe sex. Use a condom or other form of protection in order to prevent STIs (sexually transmitted infections).  Talk with your health care provider about taking a low-dose aspirin or statin. What's next?  Go to your health care provider once a year for a well check visit.  Ask your health care provider how often you should have your eyes and teeth checked.  Stay up to date on all vaccines. This information is not intended to replace advice given to you by your health care provider. Make sure you discuss any questions you have  with your health care provider. Document Revised: 11/25/2018 Document Reviewed: 11/25/2018 Elsevier Patient Education  2020 [employer].

## 2020-07-03 DIAGNOSIS — E119 Type 2 diabetes mellitus without complications: Secondary | ICD-10-CM | POA: Diagnosis not present

## 2020-07-03 DIAGNOSIS — E7800 Pure hypercholesterolemia, unspecified: Secondary | ICD-10-CM | POA: Diagnosis not present

## 2020-07-03 DIAGNOSIS — Z1159 Encounter for screening for other viral diseases: Secondary | ICD-10-CM | POA: Diagnosis not present

## 2020-07-03 DIAGNOSIS — E034 Atrophy of thyroid (acquired): Secondary | ICD-10-CM | POA: Diagnosis not present

## 2020-07-03 DIAGNOSIS — I10 Essential (primary) hypertension: Secondary | ICD-10-CM | POA: Diagnosis not present

## 2020-07-04 ENCOUNTER — Telehealth: Payer: Self-pay

## 2020-07-04 LAB — LIPID PANEL WITH LDL/HDL RATIO
Cholesterol, Total: 144 mg/dL (ref 100–199)
HDL: 53 mg/dL (ref >39)
LDL Chol Calc (NIH): 75 mg/dL (ref 0–99)
LDL/HDL Ratio: 1.4 ratio (ref 0.0–3.2)
Triglycerides: 81 mg/dL (ref 0–149)
VLDL Cholesterol Cal: 16 mg/dL (ref 5–40)

## 2020-07-04 LAB — COMPREHENSIVE METABOLIC PANEL
ALT: 14 IU/L (ref 0–32)
AST: 14 IU/L (ref 0–40)
Albumin/Globulin Ratio: 2 (ref 1.2–2.2)
Albumin: 4.2 g/dL (ref 3.7–4.7)
Alkaline Phosphatase: 79 IU/L (ref 48–121)
BUN/Creatinine Ratio: 19 (ref 12–28)
BUN: 14 mg/dL (ref 8–27)
Bilirubin Total: 0.4 mg/dL (ref 0.0–1.2)
CO2: 25 mmol/L (ref 20–29)
Calcium: 10.1 mg/dL (ref 8.7–10.3)
Chloride: 103 mmol/L (ref 96–106)
Creatinine, Ser: 0.75 mg/dL (ref 0.57–1.00)
GFR calc Af Amer: 90 mL/min/{1.73_m2} (ref >59)
GFR calc non Af Amer: 78 mL/min/{1.73_m2} (ref >59)
Globulin, Total: 2.1 g/dL (ref 1.5–4.5)
Glucose: 111 mg/dL — ABNORMAL HIGH (ref 65–99)
Potassium: 3.8 mmol/L (ref 3.5–5.2)
Sodium: 143 mmol/L (ref 134–144)
Total Protein: 6.3 g/dL (ref 6.0–8.5)

## 2020-07-04 LAB — TSH: TSH: 1.08 u[IU]/mL (ref 0.450–4.500)

## 2020-07-04 LAB — HEMOGLOBIN A1C
Est. average glucose Bld gHb Est-mCnc: 123 mg/dL
Hgb A1c MFr Bld: 5.9 % — ABNORMAL HIGH (ref 4.8–5.6)

## 2020-07-04 LAB — HEPATITIS C ANTIBODY: Hep C Virus Ab: <0.1 s/co ratio (ref 0.0–0.9)

## 2020-07-04 NOTE — Telephone Encounter (Signed)
-----   Message from Mar Daring, [place] sent at 07/04/2020  9:23 AM EDT ----- Kidney and liver function are normal. Sodium, potassium, and calcium are normal. A1c/Sugar has improved slightly from 6.0 to now 5.9. Hep C screen is negative. Cholesterol looks great! Continue rosuvastatin. Thyroid is normal.

## 2020-07-04 NOTE — Telephone Encounter (Signed)
Written by Mar Daring, PA-C on 07/04/2020 9:23 AM EDT Seen by patient Wendy Barnes on 07/04/2020 9:25 AM

## 2020-07-23 DIAGNOSIS — E119 Type 2 diabetes mellitus without complications: Secondary | ICD-10-CM | POA: Diagnosis not present

## 2020-07-23 LAB — HM DIABETES EYE EXAM

## 2020-07-24 ENCOUNTER — Encounter: Payer: Self-pay | Admitting: Physician Assistant

## 2020-07-26 ENCOUNTER — Ambulatory Visit: Payer: Medicare Other | Admitting: Physician Assistant

## 2020-08-29 ENCOUNTER — Ambulatory Visit (INDEPENDENT_AMBULATORY_CARE_PROVIDER_SITE_OTHER): Payer: Medicare Other

## 2020-08-29 ENCOUNTER — Other Ambulatory Visit: Payer: Self-pay

## 2020-08-29 DIAGNOSIS — Z23 Encounter for immunization: Secondary | ICD-10-CM

## 2020-09-17 ENCOUNTER — Ambulatory Visit: Payer: Medicare Other | Attending: Internal Medicine

## 2020-09-17 DIAGNOSIS — Z23 Encounter for immunization: Secondary | ICD-10-CM

## 2020-09-17 NOTE — Progress Notes (Signed)
   Covid-19 Vaccination Clinic  Name:  Florentina Marquart    MRN: [id number] DOB: Sep 02, 1941  09/17/2020  Ms. Cotham was observed post Covid-19 immunization for 15 minutes without incident. She was provided with Vaccine Information Sheet and instruction to access the V-Safe system.   Ms. Menn was instructed to call 911 with any severe reactions post vaccine: [name] Difficulty breathing  . Swelling of face and throat  . A fast heartbeat  . A bad rash all over body  . Dizziness and weakness

## 2020-11-09 ENCOUNTER — Other Ambulatory Visit: Payer: Self-pay | Admitting: Physician Assistant

## 2020-11-09 DIAGNOSIS — I10 Essential (primary) hypertension: Secondary | ICD-10-CM

## 2020-11-15 ENCOUNTER — Other Ambulatory Visit: Payer: Self-pay | Admitting: Physician Assistant

## 2020-11-15 DIAGNOSIS — F5101 Primary insomnia: Secondary | ICD-10-CM

## 2020-11-15 NOTE — Telephone Encounter (Signed)
Requested medication (s) are due for refill today: no  Requested medication (s) are on the active medication list: yes  Last refill: 08/01/2020  Future visit scheduled: yes  Notes to clinic:  this refill cannot be delegated    Requested Prescriptions  Pending Prescriptions Disp Refills   ALPRAZolam (XANAX) 0.5 MG tablet [Pharmacy Med Name: ALPRAZolam 0.5 MG TABLET] 45 tablet     Sig: TAKE 0.5 TABLET BY MOUTH EVERY NIGHT AT BEDTIME AS NEEDED FOR SLEEP      Not Delegated - Psychiatry:  Anxiolytics/Hypnotics Failed - 11/15/2020  1:26 PM      Failed - This refill cannot be delegated      Failed - Urine Drug Screen completed in last 360 days      Failed - Valid encounter within last 6 months    Recent Outpatient Visits           10 months ago Type 2 diabetes mellitus with diabetic cataract, without long-term current use of insulin [facility])   [facility] [place], Clearnce Sorrel, [place]   1 year ago Essential hypertension   [place], Clearnce Sorrel, [place]   1 year ago Essential hypertension   [place], [place], [place]   1 year ago Acute cystitis without hematuria   [facility] [place], Clearnce Sorrel, [place]   2 years ago Cough   [facility] [place], Dionne Bucy, [place]       Future Appointments             In 1 month [place], Clearnce Sorrel, PA-C [employer], [place]

## 2020-12-06 ENCOUNTER — Telehealth: Payer: Self-pay

## 2020-12-06 DIAGNOSIS — Z20822 Contact with and (suspected) exposure to covid-19: Secondary | ICD-10-CM

## 2020-12-06 NOTE — Telephone Encounter (Signed)
Patient advised to come in at 3pm since she was exposed 3-5 days ago.

## 2020-12-06 NOTE — Telephone Encounter (Signed)
Copied from [place] [id number]. Topic: Appointment Scheduling - Scheduling Inquiry for Clinic >> Dec 06, 2020 11:13 AM Erick Blinks wrote: Reason for CRM: Pt called to report that she was exposed to a family member Sunday-Wednesday that just tested positive on Tuesday with severe symptoms. She would like to get tested as soon as possible with the office. She herself is fully vaccinated and does not have any symptoms at this time. Please advise  Best contact: [phone]

## 2020-12-08 LAB — NOVEL CORONAVIRUS, NAA: SARS-CoV-2, NAA: NOT DETECTED

## 2020-12-08 LAB — SARS-COV-2, NAA 2 DAY TAT

## 2020-12-10 ENCOUNTER — Other Ambulatory Visit: Payer: Medicare Other

## 2020-12-26 DIAGNOSIS — M25562 Pain in left knee: Secondary | ICD-10-CM | POA: Diagnosis not present

## 2020-12-26 DIAGNOSIS — M1712 Unilateral primary osteoarthritis, left knee: Secondary | ICD-10-CM | POA: Diagnosis not present

## 2020-12-29 ENCOUNTER — Other Ambulatory Visit: Payer: Self-pay | Admitting: Physician Assistant

## 2020-12-29 DIAGNOSIS — E7800 Pure hypercholesterolemia, unspecified: Secondary | ICD-10-CM

## 2020-12-31 ENCOUNTER — Ambulatory Visit: Payer: Self-pay | Admitting: Physician Assistant

## 2021-01-02 ENCOUNTER — Ambulatory Visit (INDEPENDENT_AMBULATORY_CARE_PROVIDER_SITE_OTHER): Payer: Medicare Other | Admitting: Physician Assistant

## 2021-01-02 ENCOUNTER — Encounter: Payer: Self-pay | Admitting: Physician Assistant

## 2021-01-02 ENCOUNTER — Other Ambulatory Visit: Payer: Self-pay

## 2021-01-02 VITALS — BP 131/75 | HR 63 | Temp 97.9°F | Wt 210.0 lb

## 2021-01-02 DIAGNOSIS — I10 Essential (primary) hypertension: Secondary | ICD-10-CM

## 2021-01-02 DIAGNOSIS — E7800 Pure hypercholesterolemia, unspecified: Secondary | ICD-10-CM

## 2021-01-02 DIAGNOSIS — E034 Atrophy of thyroid (acquired): Secondary | ICD-10-CM

## 2021-01-02 DIAGNOSIS — E1142 Type 2 diabetes mellitus with diabetic polyneuropathy: Secondary | ICD-10-CM

## 2021-01-02 DIAGNOSIS — N3281 Overactive bladder: Secondary | ICD-10-CM | POA: Diagnosis not present

## 2021-01-02 MED ORDER — OXYBUTYNIN CHLORIDE ER 10 MG PO TB24
10.0000 mg | ORAL_TABLET | Freq: Every day | ORAL | 1 refills | Status: DC
Start: 2021-01-02 — End: 2021-08-21

## 2021-01-02 MED ORDER — METFORMIN HCL 500 MG PO TABS
500.0000 mg | ORAL_TABLET | Freq: Every day | ORAL | 1 refills | Status: DC
Start: 2021-01-02 — End: 2021-09-10

## 2021-01-02 MED ORDER — AMLODIPINE BESYLATE 5 MG PO TABS
5.0000 mg | ORAL_TABLET | Freq: Every day | ORAL | 1 refills | Status: DC
Start: 2021-01-02 — End: 2021-08-21

## 2021-01-02 MED ORDER — LEVOTHYROXINE SODIUM 88 MCG PO TABS: 88.0000 ug | ORAL_TABLET | Freq: Every day | ORAL | 1 refills | Status: DC

## 2021-01-02 MED ORDER — ROSUVASTATIN CALCIUM 5 MG PO TABS
5.0000 mg | ORAL_TABLET | Freq: Every day | ORAL | 1 refills | Status: DC
Start: 2021-01-02 — End: 2021-08-21

## 2021-01-02 NOTE — Patient Instructions (Signed)
Diabetes Mellitus and Foot Care Foot care is an important part of your health, especially when you have diabetes. Diabetes may cause you to have problems because of poor blood flow (circulation) to your feet and legs, which can cause your skin to:  Become thinner and drier.  Break more easily.  Heal more slowly.  Peel and crack. You may also have nerve damage (neuropathy) in your legs and feet, causing decreased feeling in them. This means that you may not notice minor injuries to your feet that could lead to more serious problems. Noticing and addressing any potential problems early is the best way to prevent future foot problems. How to care for your feet Foot hygiene  Wash your feet daily with warm water and mild soap. Do not use hot water. Then, pat your feet and the areas between your toes until they are completely dry. Do not soak your feet as this can dry your skin.  Trim your toenails straight across. Do not dig under them or around the cuticle. File the edges of your nails with an emery board or nail file.  Apply a moisturizing lotion or petroleum jelly to the skin on your feet and to dry, brittle toenails. Use lotion that does not contain alcohol and is unscented. Do not apply lotion between your toes.   Shoes and socks  Wear clean socks or stockings every day. Make sure they are not too tight. Do not wear knee-high stockings since they may decrease blood flow to your legs.  Wear shoes that fit properly and have enough cushioning. Always look in your shoes before you put them on to be sure there are no objects inside.  To break in new shoes, wear them for just a few hours a day. This prevents injuries on your feet. Wounds, scrapes, corns, and calluses  Check your feet daily for blisters, cuts, bruises, sores, and redness. If you cannot see the bottom of your feet, use a mirror or ask someone for help.  Do not cut corns or calluses or try to remove them with medicine.  If you  find a minor scrape, cut, or break in the skin on your feet, keep it and the skin around it clean and dry. You may clean these areas with mild soap and water. Do not clean the area with peroxide, alcohol, or iodine.  If you have a wound, scrape, corn, or callus on your foot, look at it several times a day to make sure it is healing and not infected. Check for: ? Redness, swelling, or pain. ? Fluid or blood. ? Warmth. ? Pus or a bad smell.   General tips  Do not cross your legs. This may decrease blood flow to your feet.  Do not use heating pads or hot water bottles on your feet. They may burn your skin. If you have lost feeling in your feet or legs, you may not know this is happening until it is too late.  Protect your feet from hot and cold by wearing shoes, such as at the beach or on hot pavement.  Schedule a complete foot exam at least once a year (annually) or more often if you have foot problems. Report any cuts, sores, or bruises to your health care provider immediately. Where to find more information  American Diabetes Association: www.diabetes.org  Association of Diabetes Care & Education Specialists: www.diabeteseducator.org Contact a health care provider if:  You have a medical condition that increases your risk of infection and   you have any cuts, sores, or bruises on your feet.  You have an injury that is not healing.  You have redness on your legs or feet.  You feel burning or tingling in your legs or feet.  You have pain or cramps in your legs and feet.  Your legs or feet are numb.  Your feet always feel cold.  You have pain around any toenails. Get help right away if:  You have a wound, scrape, corn, or callus on your foot and: ? You have pain, swelling, or redness that gets worse. ? You have fluid or blood coming from the wound, scrape, corn, or callus. ? Your wound, scrape, corn, or callus feels warm to the touch. ? You have pus or a bad smell coming from  the wound, scrape, corn, or callus. ? You have a fever. ? You have a red line going up your leg. Summary  Check your feet every day for blisters, cuts, bruises, sores, and redness.  Apply a moisturizing lotion or petroleum jelly to the skin on your feet and to dry, brittle toenails.  Wear shoes that fit properly and have enough cushioning.  If you have foot problems, report any cuts, sores, or bruises to your health care provider immediately.  Schedule a complete foot exam at least once a year (annually) or more often if you have foot problems. This information is not intended to replace advice given to you by your health care provider. Make sure you discuss any questions you have with your health care provider. Document Revised: 06/21/2020 Document Reviewed: 06/21/2020 Elsevier Patient Education  2021 [employer]  

## 2021-01-02 NOTE — Progress Notes (Signed)
Established patient visit   Patient: Wendy Barnes   DOB: January 19, 1941   80 y.o. Female  MRN: [id number] Visit Date: 01/02/2021  Today's healthcare provider: Mar Daring, PA-C   Chief Complaint  Patient presents with  . Diabetes  . Hyperlipidemia  . Hypertension   Subjective    HPI  Diabetes Mellitus Type II, follow-up  Lab Results  Component Value Date   HGBA1C 5.9 (H) 07/03/2020   HGBA1C 6.0 (H) 12/22/2019   HGBA1C 6.1 (H) 06/13/2019   Last seen for diabetes 6 months ago.  Management since then includes continuing the same treatment. She reports excellent compliance with treatment. She is not having side effects.   Home blood sugar records: are not being checked.  Episodes of hypoglycemia? No    Current insulin regiment: none Most Recent Eye Exam: 07/23/2020  --------------------------------------------------------------------------------------------------- Hypertension, follow-up  BP Readings from Last 3 Encounters:  01/02/21 131/75  06/28/20 125/71  12/22/19 124/68   Wt Readings from Last 3 Encounters:  01/02/21 210 lb (95.3 kg)  06/28/20 211 lb 12.8 oz (96.1 kg)  12/22/19 214 lb (97.1 kg)     She was last seen for hypertension 6 months ago.  Management since that visit includes no changes. She reports excellent compliance with treatment. She is not having side effects.  She is exercising. She is adherent to low salt diet.   Outside blood pressures are not being checked at home.  She does not smoke.  Use of agents associated with hypertension: none.   --------------------------------------------------------------------------------------------------- Lipid/Cholesterol, follow-up  Last Lipid Panel: Lab Results  Component Value Date   CHOL 144 07/03/2020   LDLCALC 75 07/03/2020   HDL 53 07/03/2020   TRIG 81 07/03/2020    She was last seen for this 6 months ago.  Management since that visit includes no changes.  She reports  excellent compliance with treatment. She is not having side effects.   Symptoms: No appetite changes No foot ulcerations  No chest pain No chest pressure/discomfort  No dyspnea No orthopnea  No fatigue No lower extremity edema  No palpitations No paroxysmal nocturnal dyspnea  No nausea No numbness or tingling of extremity  No polydipsia No polyuria  No speech difficulty No syncope   She is following a Regular diet.   Last metabolic panel Lab Results  Component Value Date   GLUCOSE 111 (H) 07/03/2020   NA 143 07/03/2020   K 3.8 07/03/2020   BUN 14 07/03/2020   CREATININE 0.75 07/03/2020   GFRNONAA 78 07/03/2020   GFRAA 90 07/03/2020   CALCIUM 10.1 07/03/2020   AST 14 07/03/2020   ALT 14 07/03/2020   The 10-year ASCVD risk score Mikey Bussing DC Jr., et al., 2013) is: 45%  ---------------------------------------------------------------------------------------------------   Patient Active Problem List   Diagnosis Date Noted  . Hypothyroidism 02/11/2018  . Osteoarthritis of spine with radiculopathy, lumbar region 04/16/2016  . Status post total right knee replacement 02/24/2016  . Colon polyp 05/16/2015  . Dysfunction of eustachian tube 05/16/2015  . Type 2 diabetes mellitus with diabetic polyneuropathy, without long-term current use of insulin ([place]) 05/16/2015  . Gonalgia 05/16/2015  . Plantar fasciitis 05/16/2015  . 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 Vaginal lesion 05/16/2015  . History of diverticulitis of colon 05/16/2015  . Burning or prickling sensation 04/10/2010  . Cannot sleep 12/10/2007  . Dermatologic disease 09/15/2007  . BP (high blood pressure) 08/28/2006  . Hypercholesteremia 08/28/2006  . Mixed incontinence 08/28/2006  Past Medical History:  Diagnosis Date  . Anxiety   . Cancer ([place])    skin  . Hyperchloremia   . Hypertension   . Hypothyroidism   . Squamous cell carcinoma of skin 09/18/2008   left temple  . T2DM (type 2 diabetes mellitus)  ([place])   . Thyroid disease   . Varicella   . Vertigo    Social History   Tobacco Use  . Smoking status: Former Smoker    Packs/day: 1.00    Years: 25.00    Pack years: 25.00    Types: Cigarettes    Quit date: 12/15/1995    Years since quitting: 25.0  . Smokeless tobacco: Never Used  . Tobacco comment: quit in 1990's  Vaping Use  . Vaping Use: Never used  Substance Use Topics  . Alcohol use: Yes    Alcohol/week: 0.0 standard drinks    Comment: occasionally - glass of wine  . Drug use: No   Allergies  Allergen Reactions  . Amoxicillin Hives and Rash    Has patient had a PCN reaction causing immediate rash, facial/tongue/throat swelling, SOB or lightheadedness with hypotension: Yes Has patient had a PCN reaction causing severe rash involving mucus membranes or skin necrosis: Yes Has patient had a PCN reaction that required hospitalization: No Has patient had a PCN reaction occurring within the last 10 years: Yes If all of the above answers are "NO", then may proceed with Cephalosporin use.   . Lovastatin Other (See Comments)    myalgias  . Penicillins Hives and Rash  . Pravastatin Other (See Comments)    Myalgias     Medications: Outpatient Medications Prior to Visit  Medication Sig  . ALPRAZolam (XANAX) 0.5 MG tablet TAKE 0.5 TABLET BY MOUTH EVERY NIGHT AT BEDTIME AS NEEDED FOR SLEEP  . amLODipine (NORVASC) 5 MG tablet Take 1 tablet (5 mg total) by mouth daily.  [name] aspirin 81 MG tablet Take 81 mg by mouth at bedtime.   . benazepril-hydrochlorthiazide (LOTENSIN HCT) 20-12.5 MG tablet TAKE ONE TABLET BY MOUTH TWICE A DAY  . Biotin 2500 MCG CAPS Take 2,500 mcg by mouth daily.  . Calcium Carbonate-Vitamin D 600-200 MG-UNIT CAPS Take 1 tablet by mouth daily with supper.   [name] CRANBERRY CONCENTRATE PO Take by mouth.  . Cranberry-Vitamin C-Vitamin E (CRANBERRY PLUS VITAMIN C) 4200-20-3 MG-MG-UNIT CAPS Take 1 capsule by mouth daily.  [name] levothyroxine (SYNTHROID) 88 MCG tablet  Take 1 tablet (88 mcg total) by mouth daily.  . metFORMIN (GLUCOPHAGE) 500 MG tablet Take 1 tablet (500 mg total) by mouth daily with breakfast.  . MULTIPLE VITAMIN PO Take 1 tablet by mouth daily.   . Omega-3 Fatty Acids (FISH OIL ULTRA) 1400 MG CAPS Take 1,400 mg by mouth daily.  [name] oxybutynin (DITROPAN-XL) 10 MG 24 hr tablet Take 1 tablet (10 mg total) by mouth at bedtime.  . rosuvastatin (CRESTOR) 5 MG tablet TAKE ONE TABLET BY MOUTH DAILY  . triamcinolone cream (KENALOG) 0.1 % Apply 1 application topically as directed. BID for 2 weeks then decrease to qd 5 times per week. Avoid face, groin, underarms.   No facility-administered medications prior to visit.    Review of Systems  Constitutional: Negative.   Respiratory: Negative.   Cardiovascular: Negative.   Gastrointestinal: Negative.   Musculoskeletal: Positive for arthralgias (Left knee pain) and joint swelling.  Neurological: Negative for dizziness, light-headedness and headaches.       Objective    BP 131/75 (BP Location: Left  Arm, Patient Position: Sitting, Cuff Size: Large)   Pulse 63   Temp 97.9 F (36.6 C) (Oral)   Wt 210 lb (95.3 kg)   BMI 36.05 kg/m     Physical Exam Vitals reviewed.  Constitutional:      General: She is not in acute distress.    Appearance: Normal appearance. She is well-developed, well-groomed and well-nourished. She is obese. She is not ill-appearing or diaphoretic.  HENT:     Head: Normocephalic and atraumatic.  Eyes:     General: No scleral icterus. Neck:     Thyroid: No thyromegaly.     Vascular: No JVD.     Trachea: No tracheal deviation.  Cardiovascular:     Rate and Rhythm: Normal rate and regular rhythm.     Heart sounds: Normal heart sounds. No murmur heard. No friction rub. No gallop.   Pulmonary:     Effort: Pulmonary effort is normal. No respiratory distress.     Breath sounds: Normal breath sounds. No wheezing or rales.  Musculoskeletal:     Cervical back: Normal range  of motion and neck supple. No tenderness.     Right lower leg: No edema.     Left lower leg: No edema.  Neurological:     Mental Status: She is alert.  Psychiatric:        Behavior: Behavior is cooperative.      No results found for any visits on 01/02/21.  Assessment & Plan     1. Primary hypertension Stable. Continue amlodipine 5mg , benazepril-HCTZ 20-12.5mg  BID. Will check labs as below and f/u pending results. - Comprehensive Metabolic Panel (CMET) - amLODipine (NORVASC) 5 MG tablet; Take 1 tablet (5 mg total) by mouth daily.  Dispense: 90 tablet; Refill: 1  2. Type 2 diabetes mellitus with diabetic polyneuropathy, without long-term current use of insulin (HCC) Stable. Diagnosis pulled for medication refill. Continue current medical treatment plan. Will check labs as below and f/u pending results. - HgB A1c - Comprehensive Metabolic Panel (CMET) - metFORMIN (GLUCOPHAGE) 500 MG tablet; Take 1 tablet (500 mg total) by mouth daily with breakfast.  Dispense: 90 tablet; Refill: 1  3. Hypothyroidism due to acquired atrophy of thyroid Stable. Diagnosis pulled for medication refill. Continue current medical treatment plan. Will check labs as below and f/u pending results. - TSH - levothyroxine (SYNTHROID) 88 MCG tablet; Take 1 tablet (88 mcg total) by mouth daily.  Dispense: 90 tablet; Refill: 1  4. Hypercholesteremia Stable on rosuvastatin 5mg . Will check labs as below and f/u pending results. - Comprehensive Metabolic Panel (CMET) - Lipid Panel With LDL/HDL Ratio - rosuvastatin (CRESTOR) 5 MG tablet; Take 1 tablet (5 mg total) by mouth daily.  Dispense: 90 tablet; Refill: 1  6. OAB (overactive bladder) Stable. Diagnosis pulled for medication refill. Continue current medical treatment plan. - oxybutynin (DITROPAN-XL) 10 MG 24 hr tablet; Take 1 tablet (10 mg total) by mouth at bedtime.  Dispense: 90 tablet; Refill: 1    No follow-ups on file.      Reynolds Bowl,  PA-C, have reviewed all documentation for this visit. The documentation on 01/08/21 for the exam, diagnosis, procedures, and orders are all accurate and complete.   Rubye Beach  [facility] [phone] (phone) [phone] (fax)  [place]

## 2021-01-03 LAB — COMPREHENSIVE METABOLIC PANEL
ALT: 20 IU/L (ref 0–32)
AST: 24 IU/L (ref 0–40)
Albumin/Globulin Ratio: 1.7 (ref 1.2–2.2)
Albumin: 4.6 g/dL (ref 3.7–4.7)
Alkaline Phosphatase: 92 IU/L (ref 44–121)
BUN/Creatinine Ratio: 12 (ref 12–28)
BUN: 12 mg/dL (ref 8–27)
Bilirubin Total: 0.5 mg/dL (ref 0.0–1.2)
CO2: 22 mmol/L (ref 20–29)
Calcium: 10.4 mg/dL — ABNORMAL HIGH (ref 8.7–10.3)
Chloride: 101 mmol/L (ref 96–106)
Creatinine, Ser: 0.99 mg/dL (ref 0.57–1.00)
GFR calc Af Amer: 64 mL/min/{1.73_m2} (ref >59)
GFR calc non Af Amer: 55 mL/min/{1.73_m2} — ABNORMAL LOW (ref >59)
Globulin, Total: 2.7 g/dL (ref 1.5–4.5)
Glucose: 106 mg/dL — ABNORMAL HIGH (ref 65–99)
Potassium: 4 mmol/L (ref 3.5–5.2)
Sodium: 139 mmol/L (ref 134–144)
Total Protein: 7.3 g/dL (ref 6.0–8.5)

## 2021-01-03 LAB — LIPID PANEL WITH LDL/HDL RATIO
Cholesterol, Total: 172 mg/dL (ref 100–199)
HDL: 63 mg/dL (ref >39)
LDL Chol Calc (NIH): 85 mg/dL (ref 0–99)
LDL/HDL Ratio: 1.3 ratio (ref 0.0–3.2)
Triglycerides: 139 mg/dL (ref 0–149)
VLDL Cholesterol Cal: 24 mg/dL (ref 5–40)

## 2021-01-03 LAB — HEMOGLOBIN A1C
Est. average glucose Bld gHb Est-mCnc: 128 mg/dL
Hgb A1c MFr Bld: 6.1 % — ABNORMAL HIGH (ref 4.8–5.6)

## 2021-01-03 LAB — TSH: TSH: 1.31 u[IU]/mL (ref 0.450–4.500)

## 2021-01-07 DIAGNOSIS — M1712 Unilateral primary osteoarthritis, left knee: Secondary | ICD-10-CM | POA: Diagnosis not present

## 2021-01-08 ENCOUNTER — Encounter: Payer: Self-pay | Admitting: Physician Assistant

## 2021-02-08 ENCOUNTER — Telehealth: Payer: Self-pay

## 2021-02-08 NOTE — Telephone Encounter (Signed)
Apt scheduled with Dr. Jacinto Reap 07/08/2021 at 2pm.   Thanks,   -Mickel Baas

## 2021-02-08 NOTE — Telephone Encounter (Signed)
Copied from [place] [id number]. Topic: General - Other >> Feb 08, 2021 10:59 AM Wendy Barnes A wrote: Reason for CRM: Patient has made contact requesting to be reassigned as a patient of Dr. Brita Romp If Dr.B is unavailable patient would prefer to be seen by another female provider Please contact to advise  (Patient disclosed and shared their sincere appreciation for Prov. Burnette)

## 2021-05-04 ENCOUNTER — Other Ambulatory Visit: Payer: Self-pay | Admitting: Physician Assistant

## 2021-05-04 DIAGNOSIS — I10 Essential (primary) hypertension: Secondary | ICD-10-CM

## 2021-05-09 ENCOUNTER — Other Ambulatory Visit: Payer: Self-pay | Admitting: Physician Assistant

## 2021-05-09 DIAGNOSIS — F5101 Primary insomnia: Secondary | ICD-10-CM

## 2021-05-10 ENCOUNTER — Other Ambulatory Visit: Payer: Self-pay | Admitting: Family Medicine

## 2021-05-10 DIAGNOSIS — F5101 Primary insomnia: Secondary | ICD-10-CM

## 2021-05-10 MED ORDER — ALPRAZOLAM 0.5 MG PO TABS: ORAL_TABLET | ORAL | 1 refills | Status: DC

## 2021-05-10 NOTE — Telephone Encounter (Signed)
Requested medication (s) are due for refill today: yes  Requested medication (s) are on the active medication list: yes  Last refill: 11/16/20  Future visit scheduled: yes  Notes to clinic:  not delegated    Requested Prescriptions  Pending Prescriptions Disp Refills   ALPRAZolam (XANAX) 0.5 MG tablet 45 tablet 1    Sig: TAKE 0.5 TABLET BY MOUTH EVERY NIGHT AT BEDTIME AS NEEDED FOR SLEEP      Not Delegated - Psychiatry:  Anxiolytics/Hypnotics Failed - 05/10/2021 10:38 AM      Failed - This refill cannot be delegated      Failed - Urine Drug Screen completed in last 360 days      Passed - Valid encounter within last 6 months    Recent Outpatient Visits           4 months ago Primary hypertension   [place], [place], PA-C   1 year ago Type 2 diabetes mellitus with diabetic cataract, without long-term current use of insulin [facility])   [facility] [place], Clearnce Sorrel, [place]   1 year ago Essential hypertension   [place], Clearnce Sorrel, [place]   1 year ago Essential hypertension   [place], [place], [place]   1 year ago Acute cystitis without hematuria   [facility] [place], [place], [place]

## 2021-05-10 NOTE — Telephone Encounter (Signed)
Copied from [place] [id number]. Topic: Quick Communication - Rx Refill/Question >> May 10, 2021  9:33 AM Yvette Rack wrote: Medication: ALPRAZolam Duanne Moron) 0.5 MG tablet  Has the patient contacted their pharmacy? yes - Pt told to call the office  Preferred Pharmacy (with phone number or street name): [facility], [place]  Phone: [phone]   Fax: [phone]  Agent: Please be advised that RX refills may take up to 3 business days. We ask that you follow-up with your pharmacy.

## 2021-05-20 ENCOUNTER — Ambulatory Visit: Payer: Self-pay | Admitting: *Deleted

## 2021-05-20 NOTE — Telephone Encounter (Signed)
Pt reports lower abdominal pain, onset Friday. States "I'm sure this is a diverticulosis flare up, feels like all others." Pain is intermittent,rates at 6/10 when occurs, onset Friday. Denies any fever, no nausea, vomiting, diarrhea. Calling initially to ask antibiotics be called in as has been done in the past. Advised would need to be seen. Pt has TOC appt end of July with Dr. Brita Romp. NT called practice, Jiles Garter who secured appt with Dr. Rosanna Randy for tomorrow. Advised ED if symptoms worsen, Pt verbalizes understanding.   Reason for Disposition . Age > 60 years  Answer Assessment - Initial Assessment Questions 1. LOCATION: "Where does it hurt?"      Lower abdomen 2. RADIATION: "Does the pain shoot anywhere else?" (e.g., chest, back)     NO 3. ONSET: "When did the pain begin?" (e.g., minutes, hours or days ago)      Friday 4. SUDDEN: "Gradual or sudden onset?"     Gradual 5. PATTERN "Does the pain come and go, or is it constant?"    - If constant: "Is it getting better, staying the same, or worsening?"      (Note: Constant means the pain never goes away completely; most serious pain is constant and it progresses)     - If intermittent: "How long does it last?" "Do you have pain now?"     (Note: Intermittent means the pain goes away completely between bouts)     Comes and goes 6. SEVERITY: "How bad is the pain?"  (e.g., Scale 1-10; mild, moderate, or severe)   - MILD (1-3): doesn't interfere with normal activities, abdomen soft and not tender to touch    - MODERATE (4-7): interferes with normal activities or awakens from sleep, abdomen tender to touch    - SEVERE (8-10): excruciating pain, doubled over, unable to do any normal activities      6/10 7. RECURRENT SYMPTOM: "Have you ever had this type of stomach pain before?" If Yes, ask: "When was the last time?" and "What happened that time?"      Yes. H/O diverticulosis. 8. CAUSE: "What do you think is causing the stomach pain?"      Diverticulitis. 9. RELIEVING/AGGRAVATING FACTORS: "What makes it better or worse?" (e.g., movement, antacids, bowel movement)     No 10. OTHER SYMPTOMS: "Do you have any other symptoms?" (e.g., back pain, diarrhea, fever, urination pain, vomiting)       NO  Protocols used: ABDOMINAL PAIN - [facility]

## 2021-05-21 ENCOUNTER — Encounter: Payer: Self-pay | Admitting: Family Medicine

## 2021-05-21 ENCOUNTER — Other Ambulatory Visit: Payer: Self-pay

## 2021-05-21 ENCOUNTER — Ambulatory Visit (INDEPENDENT_AMBULATORY_CARE_PROVIDER_SITE_OTHER): Payer: Medicare Other | Admitting: Family Medicine

## 2021-05-21 VITALS — BP 129/77 | HR 64 | Resp 18 | Wt 214.0 lb

## 2021-05-21 DIAGNOSIS — I10 Essential (primary) hypertension: Secondary | ICD-10-CM

## 2021-05-21 DIAGNOSIS — Z8719 Personal history of other diseases of the digestive system: Secondary | ICD-10-CM | POA: Diagnosis not present

## 2021-05-21 DIAGNOSIS — E1142 Type 2 diabetes mellitus with diabetic polyneuropathy: Secondary | ICD-10-CM

## 2021-05-21 DIAGNOSIS — N3000 Acute cystitis without hematuria: Secondary | ICD-10-CM

## 2021-05-21 DIAGNOSIS — E034 Atrophy of thyroid (acquired): Secondary | ICD-10-CM | POA: Diagnosis not present

## 2021-05-21 DIAGNOSIS — R1031 Right lower quadrant pain: Secondary | ICD-10-CM

## 2021-05-21 DIAGNOSIS — E7800 Pure hypercholesterolemia, unspecified: Secondary | ICD-10-CM | POA: Diagnosis not present

## 2021-05-21 LAB — POCT URINALYSIS DIPSTICK
Bilirubin, UA: NEGATIVE
Blood, UA: NEGATIVE
Glucose, UA: NEGATIVE
Ketones, UA: NEGATIVE
Leukocytes, UA: NEGATIVE
Nitrite, UA: NEGATIVE
Protein, UA: NEGATIVE
Spec Grav, UA: 1.01 (ref 1.010–1.025)
Urobilinogen, UA: 0.2 E.U./dL
pH, UA: 6 (ref 5.0–8.0)

## 2021-05-21 MED ORDER — BENAZEPRIL-HYDROCHLOROTHIAZIDE 20-12.5 MG PO TABS: 1.0000 | ORAL_TABLET | Freq: Two times a day (BID) | ORAL | 1 refills | Status: DC

## 2021-05-21 MED ORDER — SULFAMETHOXAZOLE-TRIMETHOPRIM 800-160 MG PO TABS: 1.0000 | ORAL_TABLET | Freq: Two times a day (BID) | ORAL | 0 refills | Status: DC

## 2021-05-21 NOTE — Progress Notes (Signed)
I,April Miller,acting as a scribe for Wilhemena Durie, MD.,have documented all relevant documentation on the behalf of Wilhemena Durie, MD,as directed by  Wilhemena Durie, MD while in the presence of Wilhemena Durie, MD.   Established patient visit   Patient: Wendy Barnes   DOB: December 29, 1940   80 y.o. Female  MRN: [id number] Visit Date: 05/21/2021  Today's healthcare provider: Wilhemena Durie, MD   Chief Complaint  Patient presents with   Abdominal Pain   Subjective    HPI  Diverticulitis Patient believes she is having a flare-up with diverticulitis symptoms. Pain in LRQ. Patient states she has been having pain/discomfort for 4 days. Symptoms have improved over the past day.  She states she has had this before and it improved. Is taking regular medication and her blood pressure and diabetes are both well controlled.  She has tolerated all her medication in the past.    Medications: Outpatient Medications Prior to Visit  Medication Sig   ALPRAZolam (XANAX) 0.5 MG tablet TAKE 0.5 TABLET BY MOUTH EVERY NIGHT AT BEDTIME AS NEEDED FOR SLEEP   amLODipine (NORVASC) 5 MG tablet Take 1 tablet (5 mg total) by mouth daily.   aspirin 81 MG tablet Take 81 mg by mouth at bedtime.    Biotin 2500 MCG CAPS Take 2,500 mcg by mouth daily.   Calcium Carbonate-Vitamin D 600-200 MG-UNIT CAPS Take 1 tablet by mouth daily with supper.    Calcium Citrate (CITRACAL PO) Take by mouth.   CRANBERRY CONCENTRATE PO Take by mouth.   Cranberry-Vitamin C-Vitamin E (CRANBERRY PLUS VITAMIN C) 4200-20-3 MG-MG-UNIT CAPS Take 1 capsule by mouth daily.   levothyroxine (SYNTHROID) 88 MCG tablet Take 1 tablet (88 mcg total) by mouth daily.   metFORMIN (GLUCOPHAGE) 500 MG tablet Take 1 tablet (500 mg total) by mouth daily with breakfast.   MULTIPLE VITAMIN PO Take 1 tablet by mouth daily.    Omega-3 Fatty Acids (FISH OIL ULTRA) 1400 MG CAPS Take 1,400 mg by mouth daily.   oxybutynin (DITROPAN-XL) 10 MG  24 hr tablet Take 1 tablet (10 mg total) by mouth at bedtime.   rosuvastatin (CRESTOR) 5 MG tablet Take 1 tablet (5 mg total) by mouth daily.   triamcinolone cream (KENALOG) 0.1 % Apply 1 application topically as directed. BID for 2 weeks then decrease to qd 5 times per week. Avoid face, groin, underarms.   [DISCONTINUED] benazepril-hydrochlorthiazide (LOTENSIN HCT) 20-12.5 MG tablet TAKE ONE TABLET BY MOUTH TWICE A DAY   No facility-administered medications prior to visit.    Review of Systems  Constitutional: Negative for appetite change, chills and fatigue.  Respiratory: Negative for chest tightness and shortness of breath.   Cardiovascular: Negative for chest pain and palpitations.  Neurological: Negative for dizziness and weakness.    Last hemoglobin A1c Lab Results  Component Value Date   HGBA1C 6.1 (H) 05/21/2021       Objective    BP 129/77 (BP Location: Left Arm, Patient Position: Sitting, Cuff Size: Large)   Pulse 64   Resp 18   Wt 214 lb (97.1 kg)   SpO2 97%   BMI 36.73 kg/m  BP Readings from Last 3 Encounters:  05/21/21 129/77  01/02/21 131/75  06/28/20 125/71   Wt Readings from Last 3 Encounters:  05/21/21 214 lb (97.1 kg)  01/02/21 210 lb (95.3 kg)  06/28/20 211 lb 12.8 oz (96.1 kg)       Physical Exam Vitals reviewed.  Constitutional:  General: She is not in acute distress.    Appearance: Normal appearance. She is well-developed. She is not ill-appearing or diaphoretic.  Cardiovascular:     Rate and Rhythm: Normal rate and regular rhythm.     Heart sounds: Normal heart sounds. No murmur heard.   No friction rub. No gallop.  Pulmonary:     Effort: Pulmonary effort is normal. No respiratory distress.     Breath sounds: Normal breath sounds. No wheezing or rales.  Abdominal:     General: Abdomen is flat. Bowel sounds are normal. There is no distension.     Palpations: Abdomen is soft. There is no mass.     Tenderness: There is no guarding or  rebound.     Comments: Minimal tenderness in right lower quadrant without guarding or rebound.  She has very minimal tenderness in the right upper quadrant and left lower quadrant.  Skin:    General: Skin is warm and dry.  Neurological:     Mental Status: She is alert and oriented to person, place, and time.      No results found for any visits on 05/21/21.  Assessment & Plan     1. Essential hypertension Blood pressure well controlled. - benazepril-hydrochlorthiazide (LOTENSIN HCT) 20-12.5 MG tablet; Take 1 tablet by mouth 2 (two) times daily.  Dispense: 180 tablet; Refill: 1 - CBC w/Diff/Platelet - Hemoglobin A1c - Comprehensive Metabolic Panel (CMET) - Lipase  2. Acute cystitis without hematuria Do not think she has any bladder infection or GU problems - CBC w/Diff/Platelet - Hemoglobin A1c - Comprehensive Metabolic Panel (CMET) - Lipase  3. Type 2 diabetes mellitus with diabetic polyneuropathy, without long-term current use of insulin (HCC) Follow-up A1c - CBC w/Diff/Platelet - Hemoglobin A1c - Comprehensive Metabolic Panel (CMET) - Lipase  4. Primary hypertension  - CBC w/Diff/Platelet - Hemoglobin A1c - Comprehensive Metabolic Panel (CMET) - Lipase  5. History of diverticulitis of colon Will treat with simple prescription for Bactrim but I do not think she has diverticulitis. - sulfamethoxazole-trimethoprim (BACTRIM DS) 800-160 MG tablet; Take 1 tablet by mouth 2 (two) times daily.  Dispense: 20 tablet; Refill: 0  6. Right lower quadrant abdominal pain Not think she first needs further evaluation such as CT scan of the abdomen looking for possible appendicitis etc.  And is to let us know if she gets worse. - POCT urinalysis dipstick - CULTURE, URINE COMPREHENSIVE   No follow-ups on file.      I, Wilhemena Durie, MD, have reviewed all documentation for this visit. The documentation on 05/24/21 for the exam, diagnosis, procedures, and orders are all  accurate and complete.     Rosalinda Seaman [place] Mon, MD  [facility] [phone] (phone) [phone] (fax)  [place]

## 2021-05-22 LAB — CBC WITH DIFFERENTIAL/PLATELET
Basophils Absolute: 0.1 10*3/uL (ref 0.0–0.2)
Basos: 1 %
EOS (ABSOLUTE): 0.2 10*3/uL (ref 0.0–0.4)
Eos: 2 %
Hematocrit: 40.4 % (ref 34.0–46.6)
Hemoglobin: 13.4 g/dL (ref 11.1–15.9)
Immature Grans (Abs): 0 10*3/uL (ref 0.0–0.1)
Immature Granulocytes: 0 %
Lymphocytes Absolute: 3 10*3/uL (ref 0.7–3.1)
Lymphs: 31 %
MCH: 28.4 pg (ref 26.6–33.0)
MCHC: 33.2 g/dL (ref 31.5–35.7)
MCV: 86 fL (ref 79–97)
Monocytes Absolute: 0.6 10*3/uL (ref 0.1–0.9)
Monocytes: 6 %
Neutrophils Absolute: 5.9 10*3/uL (ref 1.4–7.0)
Neutrophils: 60 %
Platelets: 248 10*3/uL (ref 150–450)
RBC: 4.72 x10E6/uL (ref 3.77–5.28)
RDW: 13.2 % (ref 11.7–15.4)
WBC: 9.8 10*3/uL (ref 3.4–10.8)

## 2021-05-22 LAB — COMPREHENSIVE METABOLIC PANEL
ALT: 26 IU/L (ref 0–32)
AST: 24 IU/L (ref 0–40)
Albumin/Globulin Ratio: 1.7 (ref 1.2–2.2)
Albumin: 4.5 g/dL (ref 3.7–4.7)
Alkaline Phosphatase: 101 IU/L (ref 44–121)
BUN/Creatinine Ratio: 16 (ref 12–28)
BUN: 14 mg/dL (ref 8–27)
Bilirubin Total: 0.4 mg/dL (ref 0.0–1.2)
CO2: 22 mmol/L (ref 20–29)
Calcium: 10.3 mg/dL (ref 8.7–10.3)
Chloride: 101 mmol/L (ref 96–106)
Creatinine, Ser: 0.85 mg/dL (ref 0.57–1.00)
Globulin, Total: 2.6 g/dL (ref 1.5–4.5)
Glucose: 102 mg/dL — ABNORMAL HIGH (ref 65–99)
Potassium: 3.9 mmol/L (ref 3.5–5.2)
Sodium: 140 mmol/L (ref 134–144)
Total Protein: 7.1 g/dL (ref 6.0–8.5)
eGFR: 71 mL/min/{1.73_m2} (ref >59)

## 2021-05-22 LAB — HEMOGLOBIN A1C
Est. average glucose Bld gHb Est-mCnc: 128 mg/dL
Hgb A1c MFr Bld: 6.1 % — ABNORMAL HIGH (ref 4.8–5.6)

## 2021-05-22 LAB — LIPASE: Lipase: 25 U/L (ref 14–85)

## 2021-05-26 LAB — CULTURE, URINE COMPREHENSIVE

## 2021-06-03 ENCOUNTER — Encounter: Payer: Medicare Other | Admitting: Dermatology

## 2021-06-13 ENCOUNTER — Other Ambulatory Visit: Payer: Self-pay

## 2021-06-13 ENCOUNTER — Encounter: Payer: Medicare Other | Admitting: Dermatology

## 2021-06-13 ENCOUNTER — Ambulatory Visit (INDEPENDENT_AMBULATORY_CARE_PROVIDER_SITE_OTHER): Payer: Medicare Other | Admitting: Dermatology

## 2021-06-13 DIAGNOSIS — Z1283 Encounter for screening for malignant neoplasm of skin: Secondary | ICD-10-CM

## 2021-06-13 DIAGNOSIS — L820 Inflamed seborrheic keratosis: Secondary | ICD-10-CM

## 2021-06-13 DIAGNOSIS — L720 Epidermal cyst: Secondary | ICD-10-CM | POA: Diagnosis not present

## 2021-06-13 DIAGNOSIS — D229 Melanocytic nevi, unspecified: Secondary | ICD-10-CM

## 2021-06-13 DIAGNOSIS — Z85828 Personal history of other malignant neoplasm of skin: Secondary | ICD-10-CM | POA: Diagnosis not present

## 2021-06-13 DIAGNOSIS — L578 Other skin changes due to chronic exposure to nonionizing radiation: Secondary | ICD-10-CM | POA: Diagnosis not present

## 2021-06-13 DIAGNOSIS — D1800 Hemangioma unspecified site: Secondary | ICD-10-CM | POA: Diagnosis not present

## 2021-06-13 DIAGNOSIS — L821 Other seborrheic keratosis: Secondary | ICD-10-CM | POA: Diagnosis not present

## 2021-06-13 DIAGNOSIS — L814 Other melanin hyperpigmentation: Secondary | ICD-10-CM | POA: Diagnosis not present

## 2021-06-13 NOTE — Progress Notes (Signed)
Follow-Up Visit   Subjective  Wendy Barnes is a 80 y.o. female who presents for the following: Annual Exam (Mole check ). Hx of SCC left temple. The patient presents for Total-Body Skin Exam (TBSE) for skin cancer screening and mole check.   The following portions of the chart were reviewed this encounter and updated as appropriate:   Tobacco  Allergies  Meds  Problems  Med Hx  Surg Hx  Fam Hx      Review of Systems:  No other skin or systemic complaints except as noted in HPI or Assessment and Plan.  Objective  Well appearing patient in no apparent distress; mood and affect are within normal limits.  A full examination was performed including scalp, head, eyes, ears, nose, lips, neck, chest, axillae, abdomen, back, buttocks, bilateral upper extremities, bilateral lower extremities, hands, feet, fingers, toes, fingernails, and toenails. All findings within normal limits unless otherwise noted below.  chest x   (2) (2) Erythematous keratotic or waxy stuck-on papule or plaque.   right medial breast 1.0 cm Subcutaneous nodule.    Assessment & Plan  Inflamed seborrheic keratosis chest x   (2)  Destruction of lesion - chest x   (2) Complexity: simple   Destruction method: cryotherapy   Informed consent: discussed and consent obtained   Timeout:  patient name, date of birth, surgical site, and procedure verified Lesion destroyed using liquid nitrogen: Yes   Region frozen until ice ball extended beyond lesion: Yes   Outcome: patient tolerated procedure well with no complications   Post-procedure details: wound care instructions given    Epidermal cyst right medial breast  Benign-appearing. Exam most consistent with an epidermal inclusion cyst. Discussed that a cyst is a benign growth that can grow over time and sometimes get irritated or inflamed. Recommend observation if it is not bothersome. Discussed option of surgical excision to remove it if it is growing,  symptomatic, or other changes noted. Please call for new or changing lesions so they can be evaluated.    Skin cancer screening  Lentigines - Scattered tan macules - Due to sun exposure - Benign-appering, observe - Recommend daily broad spectrum sunscreen SPF 30+ to sun-exposed areas, reapply every 2 hours as needed. - Call for any changes  Seborrheic Keratoses - Stuck-on, waxy, tan-brown papules and/or plaques  - Benign-appearing - Discussed benign etiology and prognosis. - Observe - Call for any changes  Melanocytic Nevi - Tan-brown and/or pink-flesh-colored symmetric macules and papules - Benign appearing on exam today - Observation - Call clinic for new or changing moles - Recommend daily use of broad spectrum spf 30+ sunscreen to sun-exposed areas.   Hemangiomas - Red papules - Discussed benign nature - Observe - Call for any changes  Actinic Damage - Chronic condition, secondary to cumulative UV/sun exposure - diffuse scaly erythematous macules with underlying dyspigmentation - Recommend daily broad spectrum sunscreen SPF 30+ to sun-exposed areas, reapply every 2 hours as needed.  - Staying in the shade or wearing long sleeves, sun glasses (UVA+UVB protection) and wide brim hats (4-inch brim around the entire circumference of the hat) are also recommended for sun protection.  - Call for new or changing lesions.  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  - Call if any new  or changing lesions are noted between office visits  Skin cancer screening performed today.   Return in about 1 year (around 06/13/2022) for TBSE .  IMarye Round, CMA, am acting as scribe for Sarina Ser, MD .  Documentation: I have reviewed the above documentation  for accuracy and completeness, and I agree with the above.  Sarina Ser, MD

## 2021-06-13 NOTE — Patient Instructions (Addendum)

## 2021-06-22 ENCOUNTER — Encounter: Payer: Self-pay | Admitting: Dermatology

## 2021-06-25 DIAGNOSIS — Z96651 Presence of right artificial knee joint: Secondary | ICD-10-CM | POA: Diagnosis not present

## 2021-06-27 ENCOUNTER — Encounter: Payer: Medicare Other | Admitting: Dermatology

## 2021-07-08 ENCOUNTER — Other Ambulatory Visit: Payer: Self-pay

## 2021-07-08 ENCOUNTER — Ambulatory Visit (INDEPENDENT_AMBULATORY_CARE_PROVIDER_SITE_OTHER): Payer: Medicare Other | Admitting: Family Medicine

## 2021-07-08 ENCOUNTER — Encounter: Payer: Self-pay | Admitting: Family Medicine

## 2021-07-08 VITALS — BP 139/51 | HR 64 | Temp 97.9°F | Wt 211.0 lb

## 2021-07-08 DIAGNOSIS — N3946 Mixed incontinence: Secondary | ICD-10-CM

## 2021-07-08 DIAGNOSIS — Z1231 Encounter for screening mammogram for malignant neoplasm of breast: Secondary | ICD-10-CM | POA: Diagnosis not present

## 2021-07-08 DIAGNOSIS — G4700 Insomnia, unspecified: Secondary | ICD-10-CM

## 2021-07-08 DIAGNOSIS — Z0000 Encounter for general adult medical examination without abnormal findings: Secondary | ICD-10-CM | POA: Diagnosis not present

## 2021-07-08 MED ORDER — TRAZODONE HCL 50 MG PO TABS: 25.0000 mg | ORAL_TABLET | Freq: Every evening | ORAL | 3 refills | Status: DC | PRN

## 2021-07-08 NOTE — Progress Notes (Signed)
Annual Wellness Visit     Patient: Wendy Barnes, Female    DOB: November 01, 1941, 80 y.o.   MRN: [id number] Visit Date: 07/08/2021  Today's Provider: Lavon Paganini, MD   Chief Complaint  Patient presents with   Annual Exam   Subjective    Wendy Barnes is a 80 y.o. female who presents today for her Annual Wellness Visit. She reports consuming a general diet. The patient does not participate in regular exercise at present. She generally feels well. She reports sleeping fairly well. She does have additional problems to discuss today.   HPI  Diverticulitis  The patient states that she was diagnosed with diverticulitis. She experienced stomach pain located on the bottom area of her stomach. Her symptoms resolved with an antibiotic but the antibiotic caused her to have constipation.      Medications: Outpatient Medications Prior to Visit  Medication Sig   ALPRAZolam (XANAX) 0.5 MG tablet TAKE 0.5 TABLET BY MOUTH EVERY NIGHT AT BEDTIME AS NEEDED FOR SLEEP   amLODipine (NORVASC) 5 MG tablet Take 1 tablet (5 mg total) by mouth daily.   aspirin 81 MG tablet Take 81 mg by mouth at bedtime.    benazepril-hydrochlorthiazide (LOTENSIN HCT) 20-12.5 MG tablet Take 1 tablet by mouth 2 (two) times daily.   Biotin 2500 MCG CAPS Take 2,500 mcg by mouth daily.   Calcium Carbonate-Vitamin D 600-200 MG-UNIT CAPS Take 1 tablet by mouth daily with supper.    CRANBERRY CONCENTRATE PO Take by mouth.   Cranberry-Vitamin C-Vitamin E (CRANBERRY PLUS VITAMIN C) 4200-20-3 MG-MG-UNIT CAPS Take 1 capsule by mouth daily.   levothyroxine (SYNTHROID) 88 MCG tablet Take 1 tablet (88 mcg total) by mouth daily.   metFORMIN (GLUCOPHAGE) 500 MG tablet Take 1 tablet (500 mg total) by mouth daily with breakfast.   Omega-3 Fatty Acids (FISH OIL ULTRA) 1400 MG CAPS Take 1,400 mg by mouth daily.   oxybutynin (DITROPAN-XL) 10 MG 24 hr tablet Take 1 tablet (10 mg total) by mouth at bedtime.   rosuvastatin (CRESTOR) 5 MG  tablet Take 1 tablet (5 mg total) by mouth daily.   triamcinolone cream (KENALOG) 0.1 % Apply 1 application topically as directed. BID for 2 weeks then decrease to qd 5 times per week. Avoid face, groin, underarms.   [DISCONTINUED] Calcium Citrate (CITRACAL PO) Take by mouth.   [DISCONTINUED] MULTIPLE VITAMIN PO Take 1 tablet by mouth daily.    [DISCONTINUED] sulfamethoxazole-trimethoprim (BACTRIM DS) 800-160 MG tablet Take 1 tablet by mouth 2 (two) times daily.   No facility-administered medications prior to visit.    Allergies  Allergen Reactions   Amoxicillin Hives and Rash    Has patient had a PCN reaction causing immediate rash, facial/tongue/throat swelling, SOB or lightheadedness with hypotension: Yes Has patient had a PCN reaction causing severe rash involving mucus membranes or skin necrosis: Yes Has patient had a PCN reaction that required hospitalization: No Has patient had a PCN reaction occurring within the last 10 years: Yes If all of the above answers are "NO", then may proceed with Cephalosporin use.    Lovastatin Other (See Comments)    myalgias   Penicillins Hives and Rash   Pravastatin Other (See Comments)    Myalgias    Patient Care Team: Mar Daring, PA-C as PCP - General (Family Medicine) Birder Robson, MD as Referring Physician (Ophthalmology) Ralene Bathe, MD as Consulting Physician (Dermatology) Marry Guan, Laurice Record, MD as Consulting Physician (Orthopedic Surgery)  Review of Systems  Constitutional: Negative.  Negative for chills, fatigue and fever.  HENT: Negative.  Negative for congestion, ear pain, rhinorrhea, sinus pain and sore throat.   Eyes: Negative.   Respiratory: Negative.  Negative for cough, shortness of breath and wheezing.   Cardiovascular: Negative.  Negative for chest pain and leg swelling.  Gastrointestinal: Negative.  Negative for abdominal pain, blood in stool, diarrhea, nausea and vomiting.  Endocrine: Negative.    Genitourinary: Negative.  Negative for dysuria, flank pain, frequency and urgency.  Musculoskeletal: Negative.   Skin: Negative.   Allergic/Immunologic: Negative.   Neurological: Negative.  Negative for dizziness and headaches.  Hematological: Negative.   Psychiatric/Behavioral: Negative.         Objective    Vitals: BP (!) 139/51 (BP Location: Left Arm, Patient Position: Sitting, Cuff Size: Normal)   Pulse 64   Temp 97.9 F (36.6 C) (Oral)   Wt 211 lb (95.7 kg)   SpO2 100%   BMI 36.22 kg/m    Physical Exam Vitals reviewed.  Constitutional:      General: She is not in acute distress.    Appearance: Normal appearance. She is well-developed and normal weight. She is not diaphoretic.  HENT:     Head: Normocephalic and atraumatic.     Right Ear: Tympanic membrane, ear canal and external ear normal.     Left Ear: Tympanic membrane, ear canal and external ear normal.     Nose: Nose normal.     Mouth/Throat:     Mouth: Mucous membranes are moist.     Pharynx: Oropharynx is clear. No oropharyngeal exudate.  Eyes:     General: No scleral icterus.    Extraocular Movements: Extraocular movements intact.     Conjunctiva/sclera: Conjunctivae normal.     Pupils: Pupils are equal, round, and reactive to light.  Neck:     Thyroid: No thyromegaly.  Cardiovascular:     Rate and Rhythm: Normal rate and regular rhythm.     Pulses: Normal pulses.     Heart sounds: Normal heart sounds. No murmur heard. Pulmonary:     Effort: Pulmonary effort is normal. No respiratory distress.     Breath sounds: Normal breath sounds. No wheezing or rales.  Abdominal:     General: Abdomen is flat. Bowel sounds are normal. There is no distension.     Palpations: Abdomen is soft.     Tenderness: There is no abdominal tenderness.  Musculoskeletal:        General: No deformity. Normal range of motion.     Cervical back: Normal range of motion and neck supple.     Right lower leg: No edema.     Left  lower leg: No edema.  Lymphadenopathy:     Cervical: No cervical adenopathy.  Skin:    General: Skin is warm and dry.     Findings: No rash.  Neurological:     General: No focal deficit present.     Mental Status: She is alert and oriented to person, place, and time. Mental status is at baseline.     Sensory: No sensory deficit.     Motor: No weakness.     Gait: Gait normal.  Psychiatric:        Mood and Affect: Mood normal.        Behavior: Behavior normal.        Thought Content: Thought content normal.        Judgment: Judgment normal.   Diabetic Foot Exam - Simple  Simple Foot Form Diabetic Foot exam was performed with the following findings: Yes 07/08/2021  2:34 PM  Visual Inspection No deformities, no ulcerations, no other skin breakdown bilaterally: Yes Sensation Testing See comments: Yes Pulse Check Posterior Tibialis and Dorsalis pulse intact bilaterally: Yes Comments Intact monofilament in L heel and R midfoot and heel      Most recent functional status assessment: In your present state of health, do you have any difficulty performing the following activities: 07/08/2021  Hearing? N  Vision? N  Difficulty concentrating or making decisions? N  Walking or climbing stairs? N  Dressing or bathing? N  Doing errands, shopping? N  Some recent data might be hidden   Most recent fall risk assessment: Fall Risk  07/08/2021  Falls in the past year? 0  Number falls in past yr: 0  Injury with Fall? 0  Risk for fall due to : -  Follow up -    Most recent depression screenings: PHQ 2/9 Scores 07/08/2021 05/21/2021  PHQ - 2 Score 0 0  PHQ- 9 Score 0 0   Most recent cognitive screening: 6CIT Screen 04/17/2017  What Year? 0 points  What month? 0 points  What time? 0 points  Count back from 20 0 points  Months in reverse 0 points  Repeat phrase 4 points  Total Score 4   Most recent Audit-C alcohol use screening Alcohol Use Disorder Test (AUDIT) 07/08/2021  1. How  often do you have a drink containing alcohol? 1  2. How many drinks containing alcohol do you have on a typical day when you are drinking? 0  3. How often do you have six or more drinks on one occasion? 0  AUDIT-C Score 1  Alcohol Brief Interventions/Follow-up -   A score of 3 or more in women, and 4 or more in men indicates increased risk for alcohol abuse, EXCEPT if all of the points are from question 1   No results found for any visits on 07/08/21.  Assessment & Plan     Annual wellness visit done today including the all of the following: Reviewed patient's Family Medical History Reviewed and updated list of patient's medical providers Assessment of cognitive impairment was done Assessed patient's functional ability Established a written schedule for health screening services Health Risk Assessment Completed and Reviewed  Exercise Activities and Dietary recommendations  Goals      DIET - REDUCE FAT INTAKE     Recommend cutting out all fried foods to help aid in weight loss.       Reduce portion size     Recommend to work on portion control for daily meals.          Immunization History  Administered Date(s) Administered   Fluad Quad(high Dose 65+) 08/23/2019, 08/29/2020   Influenza, High Dose Seasonal PF 10/16/2015, 08/20/2016, 09/28/2017, 10/16/2018   PFIZER(Purple Top)SARS-COV-2 Vaccination 01/16/2020, 02/06/2020, 09/17/2020   Pneumococcal Conjugate-13 10/30/2014   Pneumococcal Polysaccharide-23 01/02/2010   Zoster, Live 02/08/2009    Health Maintenance  Topic Date Due   TETANUS/TDAP  Never done   Zoster Vaccines- Shingrix (1 of 2) Never done   COVID-19 Vaccine (4 - Booster for Pfizer series) 12/18/2020   INFLUENZA VACCINE  07/15/2021   OPHTHALMOLOGY EXAM  07/23/2021   HEMOGLOBIN A1C  11/20/2021   FOOT EXAM  07/08/2022   DEXA SCAN  Completed   Hepatitis C Screening  Completed   PNA vac Low Risk Adult  Completed   HPV VACCINES  Aged  Out     Discussed  health benefits of physical activity, and encouraged her to engage in regular exercise appropriate for her age and condition.    Problem List Items Addressed This Visit       Other   Cannot sleep    Taking Xanax low dose at bedtime She has been taking for many years Discussed risks of benzos, especially in the elderly Will trial trazodone 25-50 mg qhs prn       Mixed incontinence    Continue Oxybutinin Could not afford mybetriq       Other Visit Diagnoses     Medicare annual wellness visit, subsequent    -  Primary   Breast cancer screening by mammogram       Relevant Orders   MM 3D SCREEN BREAST BILATERAL        Return in about 6 months (around 01/08/2022) for chronic disease f/u, With new PCP.     Frederic Jericho Moore[place],acting as a Education administrator for Lavon Paganini, MD.,have documented all relevant documentation on the behalf of Lavon Paganini, MD,as directed by  Lavon Paganini, MD while in the presence of Lavon Paganini, MD.  I, Lavon Paganini, MD, have reviewed all documentation for this visit. The documentation on 07/08/21 for the exam, diagnosis, procedures, and orders are all accurate and complete.   Quayshaun Hubbert, Dionne Bucy, MD, MPH [facility]

## 2021-07-08 NOTE — Assessment & Plan Note (Signed)
Taking Xanax low dose at bedtime She has been taking for many years Discussed risks of benzos, especially in the elderly Will trial trazodone 25-50 mg qhs prn

## 2021-07-08 NOTE — Assessment & Plan Note (Signed)
Continue Oxybutinin Could not afford mybetriq

## 2021-07-08 NOTE — Patient Instructions (Addendum)
The CDC recommends two doses of Shingrix (the shingles vaccine) separated by 2 to 6 months for adults age 78 years and older. I recommend checking with your insurance plan regarding coverage for this vaccine.   Consider COVID #4  Consider TDAP (tetanus)

## 2021-07-09 DIAGNOSIS — Z23 Encounter for immunization: Secondary | ICD-10-CM | POA: Diagnosis not present

## 2021-07-24 DIAGNOSIS — E119 Type 2 diabetes mellitus without complications: Secondary | ICD-10-CM | POA: Diagnosis not present

## 2021-07-24 LAB — HM DIABETES EYE EXAM

## 2021-07-26 ENCOUNTER — Encounter: Payer: Self-pay | Admitting: Family Medicine

## 2021-07-30 ENCOUNTER — Other Ambulatory Visit: Payer: Self-pay

## 2021-07-30 ENCOUNTER — Ambulatory Visit
Admission: RE | Admit: 2021-07-30 | Discharge: 2021-07-30 | Disposition: A | Discharge status: Home / Self Care | Payer: Medicare Other | Source: Ambulatory Visit | Attending: Family Medicine | Admitting: Family Medicine

## 2021-07-30 DIAGNOSIS — Z1231 Encounter for screening mammogram for malignant neoplasm of breast: Secondary | ICD-10-CM | POA: Insufficient documentation

## 2021-08-16 ENCOUNTER — Other Ambulatory Visit: Payer: Self-pay | Admitting: Physician Assistant

## 2021-08-16 DIAGNOSIS — E034 Atrophy of thyroid (acquired): Secondary | ICD-10-CM

## 2021-08-21 ENCOUNTER — Other Ambulatory Visit: Payer: Self-pay | Admitting: Family Medicine

## 2021-08-21 DIAGNOSIS — E7800 Pure hypercholesterolemia, unspecified: Secondary | ICD-10-CM

## 2021-08-21 DIAGNOSIS — N3281 Overactive bladder: Secondary | ICD-10-CM

## 2021-08-21 DIAGNOSIS — I10 Essential (primary) hypertension: Secondary | ICD-10-CM

## 2021-08-21 MED ORDER — AMLODIPINE BESYLATE 5 MG PO TABS: 5.0000 mg | ORAL_TABLET | Freq: Every day | ORAL | 0 refills | Status: DC

## 2021-08-21 MED ORDER — OXYBUTYNIN CHLORIDE ER 10 MG PO TB24: 10.0000 mg | ORAL_TABLET | Freq: Every day | ORAL | 2 refills | Status: DC

## 2021-08-21 MED ORDER — ROSUVASTATIN CALCIUM 5 MG PO TABS: 5.0000 mg | ORAL_TABLET | Freq: Every day | ORAL | 1 refills | Status: DC

## 2021-08-21 NOTE — Telephone Encounter (Signed)
Medication Refill - Medication:  oxybutynin (DITROPAN-XL) 10 MG 24 hr tablet  rosuvastatin (CRESTOR) 5 MG tablet  amLODipine (NORVASC) 5 MG tablet  Has the patient contacted their pharmacy? No.  Preferred Pharmacy (with phone number or street name):  [facility] [id number], [place]  Phone:  [phone] Fax:  [phone]  Agent: Please be advised that RX refills may take up to 3 business days. We ask that you follow-up with your pharmacy.

## 2021-08-21 NOTE — Telephone Encounter (Signed)
Pt last seen [id number] bladder 07/08/2021, Hypercholesteremia 01/02/2021, Hypertension 05/21/2021. Requested Prescriptions  Pending Prescriptions Disp Refills  . oxybutynin (DITROPAN-XL) 10 MG 24 hr tablet 90 tablet 1    Sig: Take 1 tablet (10 mg total) by mouth at bedtime.     Urology:  Bladder Agents Passed - 08/21/2021  7:20 PM      Passed - Valid encounter within last 12 months    Recent Outpatient Visits          1 month ago Medicare annual wellness visit, subsequent   [place], Dionne Bucy, MD   3 months ago Type 2 diabetes mellitus with diabetic polyneuropathy, without long-term current use of insulin [facility])   [facility] Jerrol Banana., MD   7 months ago Primary hypertension   [place], [place], PA-C   1 year ago Type 2 diabetes mellitus with diabetic cataract, without long-term current use of insulin [facility])   [place], Clearnce Sorrel, [place]   1 year ago Essential hypertension   [place], Clearnce Sorrel, [place]      Future Appointments            In 4 months Gwyneth Sprout, [place], [place]   In 10 months Ralene Bathe, MD [place]           . rosuvastatin (CRESTOR) 5 MG tablet 90 tablet 1    Sig: Take 1 tablet (5 mg total) by mouth daily.     Cardiovascular:  Antilipid - Statins Passed - 08/21/2021  7:20 PM      Passed - Total Cholesterol in normal range and within 360 days    Cholesterol, Total  Date Value Ref Range Status  01/02/2021 172 100 - 199 mg/dL Final         Passed - LDL in normal range and within 360 days    LDL Chol Calc (NIH)  Date Value Ref Range Status  01/02/2021 85 0 - 99 mg/dL Final         Passed - HDL in normal range and within 360 days    HDL  Date Value Ref Range Status  01/02/2021 63 >39 mg/dL Final         Passed - Triglycerides in normal range and within 360 days     Triglycerides  Date Value Ref Range Status  01/02/2021 139 0 - 149 mg/dL Final         Passed - Patient is not pregnant      Passed - Valid encounter within last 12 months    Recent Outpatient Visits          1 month ago Medicare annual wellness visit, subsequent   [employer], Dionne Bucy, MD   3 months ago Type 2 diabetes mellitus with diabetic polyneuropathy, without long-term current use of insulin [facility])   [facility] Jerrol Banana., MD   7 months ago Primary hypertension   [place], [place], [place]   1 year ago Type 2 diabetes mellitus with diabetic cataract, without long-term current use of insulin [facility])   [place], Clearnce Sorrel, [place]   1 year ago Essential hypertension   [place], Clearnce Sorrel, [place]      Future Appointments            In 4 months Gwyneth Sprout, [place],  [facility]   In 10 months Ralene Bathe, MD [place]           . amLODipine (NORVASC) 5 MG tablet 90 tablet 1    Sig: Take 1 tablet (5 mg total) by mouth daily.     Cardiovascular:  Calcium Channel Blockers Passed - 08/21/2021  7:20 PM      Passed - Last BP in normal range    BP Readings from Last 1 Encounters:  07/08/21 (!) 139/51         Passed - Valid encounter within last 6 months    Recent Outpatient Visits          1 month ago Medicare annual wellness visit, subsequent   [place], Dionne Bucy, MD   3 months ago Type 2 diabetes mellitus with diabetic polyneuropathy, without long-term current use of insulin [facility])   [facility] Jerrol Banana., MD   7 months ago Primary hypertension   [place], [place], [place]   1 year ago Type 2 diabetes mellitus with diabetic cataract, without long-term current use of insulin [facility])   [facility] [place], Clearnce Sorrel, [place]   1 year ago Essential hypertension   [place], Clearnce Sorrel, [place]      Future Appointments            In 4 months Gwyneth Sprout, [place], [place]   In 10 months Ralene Bathe, MD [place]

## 2021-08-27 ENCOUNTER — Encounter: Payer: Self-pay | Admitting: Family Medicine

## 2021-08-27 ENCOUNTER — Ambulatory Visit (INDEPENDENT_AMBULATORY_CARE_PROVIDER_SITE_OTHER): Payer: Medicare Other | Admitting: Family Medicine

## 2021-08-27 ENCOUNTER — Other Ambulatory Visit: Payer: Self-pay

## 2021-08-27 VITALS — BP 122/60 | HR 108 | Wt 211.0 lb

## 2021-08-27 DIAGNOSIS — N3000 Acute cystitis without hematuria: Secondary | ICD-10-CM | POA: Diagnosis not present

## 2021-08-27 DIAGNOSIS — N3281 Overactive bladder: Secondary | ICD-10-CM | POA: Diagnosis not present

## 2021-08-27 LAB — POCT URINALYSIS DIPSTICK
Bilirubin, UA: NEGATIVE
Glucose, UA: NEGATIVE
Ketones, UA: NEGATIVE
Nitrite, UA: NEGATIVE
Protein, UA: POSITIVE — AB
Spec Grav, UA: 1.015 (ref 1.010–1.025)
Urobilinogen, UA: 0.2 E.U./dL
pH, UA: 5 (ref 5.0–8.0)

## 2021-08-27 MED ORDER — CEPHALEXIN 500 MG PO CAPS: 500.0000 mg | ORAL_CAPSULE | Freq: Three times a day (TID) | ORAL | 0 refills | Status: AC

## 2021-08-27 NOTE — Addendum Note (Signed)
Addended by: Ashley Royalty E on: 08/27/2021 04:46 PM   Modules accepted: Orders

## 2021-08-27 NOTE — Progress Notes (Signed)
Established patient visit   Patient: Wendy Barnes   DOB: 1941-06-12   80 y.o. Female  MRN: [id number] Visit Date: 08/27/2021  Today's healthcare provider: Lelon Huh, MD   Chief Complaint  Patient presents with   Urinary Tract Infection   Subjective    Urinary Tract Infection  This is a new problem. Episode onset: Started about three days ago. The problem has been gradually worsening. The patient is experiencing no pain. Associated symptoms include frequency and urgency. Pertinent negatives include no discharge, flank pain, hematuria, nausea or vomiting.       Medications: Outpatient Medications Prior to Visit  Medication Sig   ALPRAZolam (XANAX) 0.5 MG tablet TAKE 0.5 TABLET BY MOUTH EVERY NIGHT AT BEDTIME AS NEEDED FOR SLEEP   amLODipine (NORVASC) 5 MG tablet Take 1 tablet (5 mg total) by mouth daily.   aspirin 81 MG tablet Take 81 mg by mouth at bedtime.    benazepril-hydrochlorthiazide (LOTENSIN HCT) 20-12.5 MG tablet Take 1 tablet by mouth 2 (two) times daily.   Biotin 2500 MCG CAPS Take 2,500 mcg by mouth daily.   Calcium Carbonate-Vitamin D 600-200 MG-UNIT CAPS Take 1 tablet by mouth daily with supper.    CRANBERRY CONCENTRATE PO Take by mouth.   Cranberry-Vitamin C-Vitamin E (CRANBERRY PLUS VITAMIN C) 4200-20-3 MG-MG-UNIT CAPS Take 1 capsule by mouth daily.   levothyroxine (SYNTHROID) 88 MCG tablet TAKE ONE TABLET BY MOUTH DAILY   metFORMIN (GLUCOPHAGE) 500 MG tablet Take 1 tablet (500 mg total) by mouth daily with breakfast.   Omega-3 Fatty Acids (FISH OIL ULTRA) 1400 MG CAPS Take 1,400 mg by mouth daily.   oxybutynin (DITROPAN-XL) 10 MG 24 hr tablet Take 1 tablet (10 mg total) by mouth at bedtime.   rosuvastatin (CRESTOR) 5 MG tablet Take 1 tablet (5 mg total) by mouth daily.   traZODone (DESYREL) 50 MG tablet Take 0.5-1 tablets (25-50 mg total) by mouth at bedtime as needed for sleep.   triamcinolone cream (KENALOG) 0.1 % Apply 1 application topically as  directed. BID for 2 weeks then decrease to qd 5 times per week. Avoid face, groin, underarms.   No facility-administered medications prior to visit.    Review of Systems  Constitutional: Negative.   Gastrointestinal:  Negative for abdominal distention, abdominal pain, constipation, diarrhea, nausea and vomiting.  Genitourinary:  Positive for dysuria, frequency and urgency. Negative for difficulty urinating, flank pain, hematuria, vaginal bleeding, vaginal discharge and vaginal pain.  Neurological:  Negative for dizziness, light-headedness and headaches.      Objective    BP 122/60 (BP Location: Right Arm, Patient Position: Sitting, Cuff Size: Large)   Pulse (!) 108   Wt 211 lb (95.7 kg)   SpO2 98%   BMI 36.22 kg/m    Physical Exam  General appearance: Mildly obese female, cooperative and in no acute distress Head: Normocephalic, without obvious abnormality, atraumatic Respiratory: Respirations even and unlabored, normal respiratory rate Extremities: All extremities are intact.  Skin: Skin color, texture, turgor normal. No rashes seen  Psych: Appropriate mood and affect. Neurologic: Mental status: Alert, oriented to person, place, and time, thought content appropriate.   Results for orders placed or performed in visit on 08/27/21  POCT urinalysis dipstick  Result Value Ref Range   Color, UA     Clarity, UA     Glucose, UA Negative Negative   Bilirubin, UA Negative    Ketones, UA Negative    Spec Grav, UA 1.015 1.010 - 1.025  Blood, UA Trace    pH, UA 5.0 5.0 - 8.0   Protein, UA Positive (A) Negative   Urobilinogen, UA 0.2 0.2 or 1.0 E.U./dL   Nitrite, UA Negative    Leukocytes, UA Small (1+) (A) Negative   Appearance     Odor      Assessment & Plan     1. Acute cystitis without hematuria  - cephALEXin (KEFLEX) 500 MG capsule; Take 1 capsule (500 mg total) by mouth 3 (three) times daily for 5 days.  Dispense: 15 capsule; Refill: 0  2. Detrusor muscle  hypertonia She doesn't feel like oxybutynin is working as well as it used. Discussed referral to urology if still not better after treating UTI         The entirety of the information documented in the History of Present Illness, Review of Systems and Physical Exam were personally obtained by me. Portions of this information were initially documented by the CMA and reviewed by me for thoroughness and accuracy.     Lelon Huh, MD  [facility] [phone] (phone) [phone] (fax)  [place]

## 2021-08-31 LAB — SPECIMEN STATUS REPORT

## 2021-08-31 LAB — URINE CULTURE

## 2021-09-06 ENCOUNTER — Other Ambulatory Visit: Payer: Self-pay | Admitting: Physician Assistant

## 2021-09-06 DIAGNOSIS — E1142 Type 2 diabetes mellitus with diabetic polyneuropathy: Secondary | ICD-10-CM

## 2021-09-10 DIAGNOSIS — Z23 Encounter for immunization: Secondary | ICD-10-CM | POA: Diagnosis not present

## 2021-10-10 DIAGNOSIS — M25511 Pain in right shoulder: Secondary | ICD-10-CM | POA: Diagnosis not present

## 2021-10-10 DIAGNOSIS — M25512 Pain in left shoulder: Secondary | ICD-10-CM | POA: Diagnosis not present

## 2021-10-10 DIAGNOSIS — M7582 Other shoulder lesions, left shoulder: Secondary | ICD-10-CM | POA: Diagnosis not present

## 2021-10-10 DIAGNOSIS — M778 Other enthesopathies, not elsewhere classified: Secondary | ICD-10-CM | POA: Diagnosis not present

## 2021-10-11 DIAGNOSIS — Z23 Encounter for immunization: Secondary | ICD-10-CM | POA: Diagnosis not present

## 2021-11-05 ENCOUNTER — Other Ambulatory Visit: Payer: Self-pay | Admitting: Family Medicine

## 2021-11-05 NOTE — Telephone Encounter (Signed)
Requested Prescriptions  Pending Prescriptions Disp Refills  . traZODone (DESYREL) 50 MG tablet [Pharmacy Med Name: traZODone 50 MG TABLET] 30 tablet 1    Sig: TAKE 1/2 TO 1 TABLET BY MOUTH AT BEDTIME AS NEEDED FOR SLEEP     Psychiatry: Antidepressants - Serotonin Modulator Passed - 11/05/2021  6:20 AM      Passed - Valid encounter within last 6 months    Recent Outpatient Visits          2 months ago Acute cystitis without hematuria   [facility] Birdie Sons, MD   4 months ago Medicare annual wellness visit, subsequent   [facility] [place], Dionne Bucy, MD   5 months ago Type 2 diabetes mellitus with diabetic polyneuropathy, without long-term current use of insulin [facility]   [facility] Jerrol Banana., MD   10 months ago Primary hypertension   [place], [place], [place]   1 year ago Type 2 diabetes mellitus with diabetic cataract, without long-term current use of insulin [facility])   [facility] [address], Clearnce Sorrel, [place]      Future Appointments            In 2 months Gwyneth Sprout, [place], [place]   In 7 months Ralene Bathe, MD [place]

## 2021-11-13 ENCOUNTER — Other Ambulatory Visit: Payer: Self-pay | Admitting: Family Medicine

## 2021-11-13 DIAGNOSIS — I10 Essential (primary) hypertension: Secondary | ICD-10-CM

## 2021-11-26 DIAGNOSIS — M7582 Other shoulder lesions, left shoulder: Secondary | ICD-10-CM | POA: Diagnosis not present

## 2021-12-16 ENCOUNTER — Other Ambulatory Visit: Payer: Self-pay | Admitting: Family Medicine

## 2021-12-16 DIAGNOSIS — I10 Essential (primary) hypertension: Secondary | ICD-10-CM

## 2021-12-18 ENCOUNTER — Other Ambulatory Visit: Payer: Self-pay | Admitting: Family Medicine

## 2021-12-18 DIAGNOSIS — E034 Atrophy of thyroid (acquired): Secondary | ICD-10-CM

## 2021-12-18 DIAGNOSIS — I10 Essential (primary) hypertension: Secondary | ICD-10-CM

## 2021-12-18 NOTE — Telephone Encounter (Signed)
[facility] called and stated that patient has switched to them for medication.   Can we send medication amLODipine (NORVASC) 5 MG tablet and levothyroxine (SYNTHROID) 88 MCG tablet to them.   [place], [place] Phone:  [phone]  Fax:  [phone]

## 2021-12-19 MED ORDER — LEVOTHYROXINE SODIUM 88 MCG PO TABS: 88.0000 ug | ORAL_TABLET | Freq: Every day | ORAL | 0 refills | Status: DC

## 2021-12-19 MED ORDER — AMLODIPINE BESYLATE 5 MG PO TABS: 5.0000 mg | ORAL_TABLET | Freq: Every day | ORAL | 0 refills | Status: DC

## 2021-12-19 NOTE — Telephone Encounter (Signed)
Future OV 01/14/22.  Patient requesting the current supply be sent to new pharmacy, [place].

## 2021-12-20 ENCOUNTER — Other Ambulatory Visit: Payer: Self-pay | Admitting: Family Medicine

## 2021-12-20 DIAGNOSIS — E7800 Pure hypercholesterolemia, unspecified: Secondary | ICD-10-CM

## 2021-12-20 MED ORDER — ROSUVASTATIN CALCIUM 5 MG PO TABS: 5.0000 mg | ORAL_TABLET | Freq: Every day | ORAL | 0 refills | Status: DC

## 2021-12-20 NOTE — Telephone Encounter (Signed)
Requested Prescriptions  Pending Prescriptions Disp Refills   rosuvastatin (CRESTOR) 5 MG tablet 90 tablet 1    Sig: Take 1 tablet (5 mg total) by mouth daily.     Cardiovascular:  Antilipid - Statins Passed - 12/20/2021  3:43 PM      Passed - Total Cholesterol in normal range and within 360 days    Cholesterol, Total  Date Value Ref Range Status  01/02/2021 172 100 - 199 mg/dL Final         Passed - LDL in normal range and within 360 days    LDL Chol Calc (NIH)  Date Value Ref Range Status  01/02/2021 85 0 - 99 mg/dL Final         Passed - HDL in normal range and within 360 days    HDL  Date Value Ref Range Status  01/02/2021 63 >39 mg/dL Final         Passed - Triglycerides in normal range and within 360 days    Triglycerides  Date Value Ref Range Status  01/02/2021 139 0 - 149 mg/dL Final         Passed - Patient is not pregnant      Passed - Valid encounter within last 12 months    Recent Outpatient Visits          3 months ago Acute cystitis without hematuria   [facility] Birdie Sons, MD   5 months ago Medicare annual wellness visit, subsequent   [facility], Dionne Bucy, MD   7 months ago Type 2 diabetes mellitus with diabetic polyneuropathy, without long-term current use of insulin [facility])   [facility] Jerrol Banana., MD   11 months ago Primary hypertension   [place], [place], [place]   1 year ago Type 2 diabetes mellitus with diabetic cataract, without long-term current use of insulin [facility])   [place], Cle[place] Sorrel, [place]      Future Appointments            In 3 weeks Gwyneth Sprout, [place], [place]   In 6 months Ralene Bathe, MD [place]

## 2021-12-20 NOTE — Telephone Encounter (Signed)
Medication Refill - Medication:   rosuvastatin (CRESTOR) 5 MG tablet   Has the patient contacted their pharmacy? Yes.   Pt no longer uses [facility] and was informed they were unable to transfer over this Rx and pt needed to contact her PCP.   Preferred Pharmacy (with phone number or street name):   [place], [place]  [place] [address]  Phone: [phone] Fax: [phone]    Has the patient been seen for an appointment in the last year OR does the patient have an upcoming appointment? Yes.    Agent: Please be advised that RX refills may take up to 3 business days. We ask that you follow-up with your pharmacy.

## 2022-01-14 ENCOUNTER — Ambulatory Visit (INDEPENDENT_AMBULATORY_CARE_PROVIDER_SITE_OTHER): Payer: Medicare Other | Admitting: Family Medicine

## 2022-01-14 ENCOUNTER — Encounter: Payer: Self-pay | Admitting: Family Medicine

## 2022-01-14 ENCOUNTER — Other Ambulatory Visit: Payer: Self-pay

## 2022-01-14 VITALS — BP 141/59 | HR 63 | Resp 16 | Wt 213.7 lb

## 2022-01-14 DIAGNOSIS — E0842 Diabetes mellitus due to underlying condition with diabetic polyneuropathy: Secondary | ICD-10-CM | POA: Diagnosis not present

## 2022-01-14 DIAGNOSIS — G629 Polyneuropathy, unspecified: Secondary | ICD-10-CM

## 2022-01-14 DIAGNOSIS — Z1211 Encounter for screening for malignant neoplasm of colon: Secondary | ICD-10-CM | POA: Diagnosis not present

## 2022-01-14 DIAGNOSIS — E1159 Type 2 diabetes mellitus with other circulatory complications: Secondary | ICD-10-CM | POA: Diagnosis not present

## 2022-01-14 DIAGNOSIS — I152 Hypertension secondary to endocrine disorders: Secondary | ICD-10-CM

## 2022-01-14 DIAGNOSIS — I10 Essential (primary) hypertension: Secondary | ICD-10-CM | POA: Diagnosis not present

## 2022-01-14 DIAGNOSIS — T148XXA Other injury of unspecified body region, initial encounter: Secondary | ICD-10-CM

## 2022-01-14 DIAGNOSIS — B351 Tinea unguium: Secondary | ICD-10-CM

## 2022-01-14 DIAGNOSIS — N3946 Mixed incontinence: Secondary | ICD-10-CM | POA: Diagnosis not present

## 2022-01-14 DIAGNOSIS — E785 Hyperlipidemia, unspecified: Secondary | ICD-10-CM

## 2022-01-14 DIAGNOSIS — E1142 Type 2 diabetes mellitus with diabetic polyneuropathy: Secondary | ICD-10-CM | POA: Diagnosis not present

## 2022-01-14 DIAGNOSIS — L602 Onychogryphosis: Secondary | ICD-10-CM | POA: Diagnosis not present

## 2022-01-14 DIAGNOSIS — N3289 Other specified disorders of bladder: Secondary | ICD-10-CM | POA: Diagnosis not present

## 2022-01-14 DIAGNOSIS — E1169 Type 2 diabetes mellitus with other specified complication: Secondary | ICD-10-CM

## 2022-01-14 DIAGNOSIS — E034 Atrophy of thyroid (acquired): Secondary | ICD-10-CM

## 2022-01-14 LAB — POCT URINALYSIS DIPSTICK
Bilirubin, UA: NEGATIVE
Blood, UA: NEGATIVE
Glucose, UA: NEGATIVE
Ketones, UA: NEGATIVE
Leukocytes, UA: NEGATIVE
Nitrite, UA: NEGATIVE
Protein, UA: NEGATIVE
Spec Grav, UA: 1.01 (ref 1.010–1.025)
Urobilinogen, UA: 0.2 E.U./dL
pH, UA: 5 (ref 5.0–8.0)

## 2022-01-14 MED ORDER — BENAZEPRIL-HYDROCHLOROTHIAZIDE 20-12.5 MG PO TABS: 2.0000 | ORAL_TABLET | Freq: Every day | ORAL | 1 refills | Status: DC

## 2022-01-14 NOTE — Assessment & Plan Note (Signed)
Ref to pod

## 2022-01-14 NOTE — Assessment & Plan Note (Signed)
Pt reports 'lower at home' however, no logs present Continue to focus on lifestyle factors to assist Chronic, stable Denies CP Denies SOB Denies DOE No LE Edema noted on exam Continue medication Refills provided Seek emergent care if you develop CP, chest pain or chest pressure

## 2022-01-14 NOTE — Assessment & Plan Note (Signed)
Request [facility]

## 2022-01-14 NOTE — Assessment & Plan Note (Signed)
0.6 cm x 0.8cm blood blister L great toe Present for months Not healed

## 2022-01-14 NOTE — Assessment & Plan Note (Signed)
Worsening symptoms on medication; reports use of only 5mg  Encouraged to check dose at home Involve uro at this time Avoid stimulants

## 2022-01-14 NOTE — Assessment & Plan Note (Signed)
Complaints of neuropathy with blood blister on L great toe Repeat A1c On metformin 500mg 

## 2022-01-14 NOTE — Patient Instructions (Addendum)
CoQ10 to assist with muscle cramps- let us know effectiveness  Add back in Vit D- 600 IU or more Add back in Calcium- 1200 mg/day  Increase water- goal of 32 oz plus 1-1 for caffeine containing beverages

## 2022-01-14 NOTE — Progress Notes (Signed)
Established patient visit   Patient: Wendy Barnes   DOB: [birth year]   81 y.o. Female  MRN: [id number] Visit Date: 01/14/2022  Today's healthcare provider: Gwyneth Sprout, FNP   Chief Complaint  Patient presents with   Hypertension   Subjective    HPI  Follow up for Mixed Incontinence  The patient was last seen for this 6 months ago. Changes made at last visit include continue Oxybutinin.  She reports good compliance with treatment. She feels that condition is Unchanged. She is not having side effects.   -----------------------------------------------------------------------------------------  Hypertension, follow-up  BP Readings from Last 3 Encounters:  01/14/22 (!) 141/59  08/27/21 122/60  07/08/21 (!) 139/51   Wt Readings from Last 3 Encounters:  01/14/22 213 lb 11.2 oz (96.9 kg)  08/27/21 211 lb (95.7 kg)  07/08/21 211 lb (95.7 kg)     She was last seen for hypertension 7 months ago.  BP at that visit was 129/77. Management since that visit includes none.  She reports excellent compliance with treatment. She is not having side effects.  She is following a Regular diet. She is not exercising. She does not smoke.  Use of agents associated with hypertension: none.   Outside blood pressures are not being checked. Symptoms: No chest pain No chest pressure  No palpitations No syncope  No dyspnea No orthopnea  No paroxysmal nocturnal dyspnea No lower extremity edema   Pertinent labs: Lab Results  Component Value Date   CHOL 172 01/02/2021   HDL 63 01/02/2021   LDLCALC 85 01/02/2021   TRIG 139 01/02/2021   CHOLHDL 3.6 06/13/2019   Lab Results  Component Value Date   NA 140 05/21/2021   K 3.9 05/21/2021   CREATININE 0.85 05/21/2021   EGFR 71 05/21/2021   GLUCOSE 102 (H) 05/21/2021   TSH 1.310 01/02/2021     The 10-year ASCVD risk score (Arnett DK, et al., 2019) is: 55.1%    ---------------------------------------------------------------------------------------------------  Diabetes Mellitus Type II, Follow-up  Lab Results  Component Value Date   HGBA1C 6.1 (H) 05/21/2021   HGBA1C 6.1 (H) 01/02/2021   HGBA1C 5.9 (H) 07/03/2020   Wt Readings from Last 3 Encounters:  01/14/22 213 lb 11.2 oz (96.9 kg)  08/27/21 211 lb (95.7 kg)  07/08/21 211 lb (95.7 kg)   Last seen for diabetes 7 months ago.  Management since then includes none. She reports excellent compliance with treatment. She is not having side effects.  Symptoms: No fatigue No foot ulcerations  No appetite changes No nausea  Yes paresthesia of the feet  No polydipsia  No polyuria No visual disturbances   No vomiting     Home blood sugar records:  not checked  Episodes of hypoglycemia? No    Current insulin regiment: none Most Recent Eye Exam: [facility] Current exercise: walking Current diet habits: well balanced  Pertinent Labs: Lab Results  Component Value Date   CHOL 172 01/02/2021   HDL 63 01/02/2021   LDLCALC 85 01/02/2021   TRIG 139 01/02/2021   CHOLHDL 3.6 06/13/2019   Lab Results  Component Value Date   NA 140 05/21/2021   K 3.9 05/21/2021   CREATININE 0.85 05/21/2021   EGFR 71 05/21/2021   MICROALBUR 20 09/28/2017     ---------------------------------------------------------------------------------------------------  Medications: Outpatient Medications Prior to Visit  Medication Sig   ALPRAZolam (XANAX) 0.5 MG tablet TAKE 0.5 TABLET BY MOUTH EVERY NIGHT AT BEDTIME AS NEEDED FOR SLEEP  amLODipine (NORVASC) 5 MG tablet Take 1 tablet (5 mg total) by mouth daily.   Biotin 2500 MCG CAPS Take 2,500 mcg by mouth daily.   Cranberry-Vitamin C-Vitamin E (CRANBERRY PLUS VITAMIN C) 4200-20-3 MG-MG-UNIT CAPS Take 1 capsule by mouth daily.   levothyroxine (SYNTHROID) 88 MCG tablet Take 1 tablet (88 mcg total) by mouth daily.   metFORMIN (GLUCOPHAGE) 500 MG  tablet TAKE ONE TABLET BY MOUTH EVERY MORNING WITH BREAKFAST   Omega-3 Fatty Acids (FISH OIL ULTRA) 1400 MG CAPS Take 1,400 mg by mouth daily.   oxybutynin (DITROPAN-XL) 10 MG 24 hr tablet Take 1 tablet (10 mg total) by mouth at bedtime.   rosuvastatin (CRESTOR) 5 MG tablet Take 1 tablet (5 mg total) by mouth daily.   traZODone (DESYREL) 50 MG tablet TAKE 1/2 TO 1 TABLET BY MOUTH AT BEDTIME AS NEEDED FOR SLEEP   triamcinolone cream (KENALOG) 0.1 % Apply 1 application topically as directed. BID for 2 weeks then decrease to qd 5 times per week. Avoid face, groin, underarms.   [DISCONTINUED] benazepril-hydrochlorthiazide (LOTENSIN HCT) 20-12.5 MG tablet TAKE ONE TABLET BY MOUTH TWICE A DAY   [DISCONTINUED] Calcium Carbonate-Vitamin D 600-200 MG-UNIT CAPS Take 1 tablet by mouth daily with supper.    [DISCONTINUED] CRANBERRY CONCENTRATE PO Take by mouth.   [DISCONTINUED] aspirin 81 MG tablet Take 81 mg by mouth at bedtime.    No facility-administered medications prior to visit.    Review of Systems     Objective    BP (!) 141/59    Pulse 63    Resp 16    Wt 213 lb 11.2 oz (96.9 kg)    SpO2 100%    BMI 36.68 kg/m    Physical Exam Vitals and nursing note reviewed.  Constitutional:      General: She is not in acute distress.    Appearance: Normal appearance. She is obese. She is not ill-appearing, toxic-appearing or diaphoretic.  HENT:     Head: Normocephalic and atraumatic.  Cardiovascular:     Rate and Rhythm: Normal rate and regular rhythm.     Pulses: Normal pulses.     Heart sounds: Normal heart sounds. No murmur heard.   No friction rub. No gallop.  Pulmonary:     Effort: Pulmonary effort is normal. No respiratory distress.     Breath sounds: Normal breath sounds. No stridor. No wheezing, rhonchi or rales.  Chest:     Chest wall: No tenderness.  Abdominal:     General: Bowel sounds are normal.     Palpations: Abdomen is soft.  Musculoskeletal:        General: No swelling,  tenderness, deformity or signs of injury. Normal range of motion.     Right lower leg: No edema.     Left lower leg: No edema.  Skin:    General: Skin is warm and dry.     Capillary Refill: Capillary refill takes less than 2 seconds.     Coloration: Skin is not jaundiced or pale.     Findings: No bruising, erythema, lesion or rash.  Neurological:     General: No focal deficit present.     Mental Status: She is alert and oriented to person, place, and time. Mental status is at baseline.     Cranial Nerves: No cranial nerve deficit.     Sensory: No sensory deficit.     Motor: No weakness.     Coordination: Coordination normal.  Psychiatric:  Mood and Affect: Mood normal.        Behavior: Behavior normal.        Thought Content: Thought content normal.        Judgment: Judgment normal.     Results for orders placed or performed in visit on 01/14/22  POCT urinalysis dipstick  Result Value Ref Range   Color, UA yellow    Clarity, UA clear    Glucose, UA Negative Negative   Bilirubin, UA negative    Ketones, UA negative    Spec Grav, UA 1.010 1.010 - 1.025   Blood, UA negative    pH, UA 5.0 5.0 - 8.0   Protein, UA Negative Negative   Urobilinogen, UA 0.2 0.2 or 1.0 E.U./dL   Nitrite, UA negative    Leukocytes, UA Negative Negative   Appearance     Odor      Assessment & Plan     Problem List Items Addressed This Visit       Cardiovascular and Mediastinum   BP (high blood pressure)    Pt reports 'lower at home' however, no logs present Continue to focus on lifestyle factors to assist Chronic, stable Denies CP Denies SOB Denies DOE No LE Edema noted on exam Continue medication Refills provided Seek emergent care if you develop CP, chest pain or chest pressure       Relevant Medications   benazepril-hydrochlorthiazide (LOTENSIN HCT) 20-12.5 MG tablet     Endocrine   Type 2 diabetes mellitus with diabetic polyneuropathy, without long-term current use of  insulin (HCC)    Complaints of neuropathy with blood blister on L great toe Repeat A1c On metformin 562m      Relevant Medications   benazepril-hydrochlorthiazide (LOTENSIN HCT) 20-12.5 MG tablet   Hypothyroidism    Repeat labs Denies complaints at this time      Relevant Orders   TSH + free T4     Nervous and Auditory   RESOLVED: Neuropathy     Musculoskeletal and Integument   Nail fungus    Ref to pod      Relevant Orders   Ambulatory referral to Podiatry     Other   Mixed incontinence    Worsening symptoms on medication; reports use of only 557mEncouraged to check dose at home Involve uro at this time Avoid stimulants      Relevant Orders   Ambulatory referral to Urology   POCT urinalysis dipstick (Completed)   Blood blister - Primary    0.6 cm x 0.8cm blood blister L great toe Present for months Not healed      Relevant Orders   Ambulatory referral to Podiatry   Thick nail    Ref to pod       Relevant Orders   Ambulatory referral to Podiatry   Bladder spasms    Worsening; recommend bp med with diuretic for am only; stop pm dosing Avoid caffeine Add water Focus on bladder training      Relevant Orders   Ambulatory referral to Urology   Colon cancer screening    [facility]    Relevant Orders   Ambulatory referral to Gastroenterology   Other Visit Diagnoses     Hyperlipidemia associated with type 2 diabetes mellitus ([facility]      Relevant Medications   benazepril-hydrochlorthiazide (LOTENSIN HCT) 20-12.5 MG tablet   Other Relevant Orders   Lipid panel   Hypertension associated with diabetes ([facility]  Relevant Medications   benazepril-hydrochlorthiazide (LOTENSIN HCT) 20-12.5 MG tablet   Other Relevant Orders   Comprehensive metabolic panel   Diabetes mellitus due to underlying condition with diabetic polyneuropathy (HCC)       Relevant Medications   benazepril-hydrochlorthiazide (LOTENSIN HCT) 20-12.5 MG tablet   Other  Relevant Orders   Ambulatory referral to Podiatry   Hemoglobin A1c   Essential hypertension       Relevant Medications   benazepril-hydrochlorthiazide (LOTENSIN HCT) 20-12.5 MG tablet      Problem List Items Addressed This Visit       Cardiovascular and Mediastinum   BP (high blood pressure)    Pt reports 'lower at home' however, no logs present Continue to focus on lifestyle factors to assist Chronic, stable Denies CP Denies SOB Denies DOE No LE Edema noted on exam Continue medication Refills provided Seek emergent care if you develop CP, chest pain or chest pressure       Relevant Medications   benazepril-hydrochlorthiazide (LOTENSIN HCT) 20-12.5 MG tablet     Endocrine   Type 2 diabetes mellitus with diabetic polyneuropathy, without long-term current use of insulin (HCC)    Complaints of neuropathy with blood blister on L great toe Repeat A1c On metformin 544m      Relevant Medications   benazepril-hydrochlorthiazide (LOTENSIN HCT) 20-12.5 MG tablet   Hypothyroidism    Repeat labs Denies complaints at this time      Relevant Orders   TSH + free T4     Nervous and Auditory   RESOLVED: Neuropathy     Musculoskeletal and Integument   Nail fungus    Ref to pod      Relevant Orders   Ambulatory referral to Podiatry     Other   Mixed incontinence    Worsening symptoms on medication; reports use of only 577mEncouraged to check dose at home Involve uro at this time Avoid stimulants      Relevant Orders   Ambulatory referral to Urology   POCT urinalysis dipstick (Completed)   Blood blister - Primary    0.6 cm x 0.8cm blood blister L great toe Present for months Not healed      Relevant Orders   Ambulatory referral to Podiatry   Thick nail    Ref to pod       Relevant Orders   Ambulatory referral to Podiatry   Bladder spasms    Worsening; recommend bp med with diuretic for am only; stop pm dosing Avoid caffeine Add water Focus on  bladder training      Relevant Orders   Ambulatory referral to Urology   Colon cancer screening    [facility]    Relevant Orders   Ambulatory referral to Gastroenterology   Other Visit Diagnoses     Hyperlipidemia associated with type 2 diabetes mellitus ([facility]      Relevant Medications   benazepril-hydrochlorthiazide (LOTENSIN HCT) 20-12.5 MG tablet   Other Relevant Orders   Lipid panel   Hypertension associated with diabetes ([facility]      Relevant Medications   benazepril-hydrochlorthiazide (LOTENSIN HCT) 20-12.5 MG tablet   Other Relevant Orders   Comprehensive metabolic panel   Diabetes mellitus due to underlying condition with diabetic polyneuropathy (HCC)       Relevant Medications   benazepril-hydrochlorthiazide (LOTENSIN HCT) 20-12.5 MG tablet   Other Relevant Orders   Ambulatory referral to Podiatry   Hemoglobin A1c   Essential hypertension  Relevant Medications   benazepril-hydrochlorthiazide (LOTENSIN HCT) 20-12.5 MG tablet        No follow-ups on file.      Vonna Kotyk, FNP, have reviewed all documentation for this visit. The documentation on 01/14/22 for the exam, diagnosis, procedures, and orders are all accurate and complete.  Patient seen and examined by Tally Joe,  FNP note scribed by Jennings [employer], [place], [place] [phone] (phone) [phone] (fax)  [place]

## 2022-01-14 NOTE — Assessment & Plan Note (Signed)
Worsening; recommend bp med with diuretic for am only; stop pm dosing Avoid caffeine Add water Focus on bladder training

## 2022-01-14 NOTE — Assessment & Plan Note (Signed)
Repeat labs Denies complaints at this time

## 2022-01-15 LAB — COMPREHENSIVE METABOLIC PANEL
ALT: 21 IU/L (ref 0–32)
AST: 25 IU/L (ref 0–40)
Albumin/Globulin Ratio: 1.8 (ref 1.2–2.2)
Albumin: 4.4 g/dL (ref 3.7–4.7)
Alkaline Phosphatase: 95 IU/L (ref 44–121)
BUN/Creatinine Ratio: 17 (ref 12–28)
BUN: 18 mg/dL (ref 8–27)
Bilirubin Total: 0.4 mg/dL (ref 0.0–1.2)
CO2: 21 mmol/L (ref 20–29)
Calcium: 10 mg/dL (ref 8.7–10.3)
Chloride: 103 mmol/L (ref 96–106)
Creatinine, Ser: 1.04 mg/dL — ABNORMAL HIGH (ref 0.57–1.00)
Globulin, Total: 2.4 g/dL (ref 1.5–4.5)
Glucose: 108 mg/dL — ABNORMAL HIGH (ref 70–99)
Potassium: 4.2 mmol/L (ref 3.5–5.2)
Sodium: 141 mmol/L (ref 134–144)
Total Protein: 6.8 g/dL (ref 6.0–8.5)
eGFR: 55 mL/min/{1.73_m2} — ABNORMAL LOW (ref >59)

## 2022-01-15 LAB — LIPID PANEL
Chol/HDL Ratio: 2.5 ratio (ref 0.0–4.4)
Cholesterol, Total: 162 mg/dL (ref 100–199)
HDL: 64 mg/dL (ref >39)
LDL Chol Calc (NIH): 69 mg/dL (ref 0–99)
Triglycerides: 177 mg/dL — ABNORMAL HIGH (ref 0–149)
VLDL Cholesterol Cal: 29 mg/dL (ref 5–40)

## 2022-01-15 LAB — TSH+FREE T4
Free T4: 1.6 ng/dL (ref 0.82–1.77)
TSH: 1.96 u[IU]/mL (ref 0.450–4.500)

## 2022-01-15 LAB — HEMOGLOBIN A1C
Est. average glucose Bld gHb Est-mCnc: 137 mg/dL
Hgb A1c MFr Bld: 6.4 % — ABNORMAL HIGH (ref 4.8–5.6)

## 2022-01-15 NOTE — Progress Notes (Signed)
Hi Wendy Barnes-  Blood chemistry showed: -slight elevation in blood sugar -elevation in kidney cleaning, creatinine; the increase in water intake we discussed will assist with this; your GRF, which is a filtration rate, was also down; would recommend recheck in 2-3 months with consistent improvement of oral water intake  Average blood sugar has increased: -remain pre-diabetic -can add additional metformin if you are willing -Continue to recommend balanced, lower carb meals. Smaller meal size, adding snacks. Choosing water as drink of choice and increasing purposeful exercise.  Cholesterol stable -slight increase in fats; however, non fasting  Blood chemistry/average blood sugar/cholesterol--> all play into your blood pressure readings too. Ensure you are taking your medication. Recommend incorporating more plants into your diet and adding in more exercise, small 15 mins chunks of time.  Tyroid normal/stable  It was a pleasure to see you in the office the other day.  Please let us know if you have any questions.  Thank you,  Tally Joe, FNP

## 2022-01-28 ENCOUNTER — Other Ambulatory Visit: Payer: Self-pay

## 2022-01-28 ENCOUNTER — Ambulatory Visit (INDEPENDENT_AMBULATORY_CARE_PROVIDER_SITE_OTHER): Payer: Medicare Other | Admitting: Podiatry

## 2022-01-28 ENCOUNTER — Encounter: Payer: Self-pay | Admitting: Podiatry

## 2022-01-28 DIAGNOSIS — T148XXA Other injury of unspecified body region, initial encounter: Secondary | ICD-10-CM | POA: Diagnosis not present

## 2022-01-28 NOTE — Progress Notes (Signed)
HPI: 81 y.o. female presenting today as a new patient for evaluation of a blood blister to the distal tip of the left great toe that is been present for few weeks now.  Her PCP referred her over here.  Patient states that she is prediabetic.  Patient would like also like to discuss different treatment options for toenail fungus.  She says that her left great toe is thick and discolored.  She has not really done anything for treatment.  She says that she did try some topical treatment several years prior but nothing recently.  She presents for further treatment and evaluation.  Past Medical History:  Diagnosis Date   Anxiety    Cancer ([place])    skin   Hyperchloremia    Hypertension    Hypothyroidism    Squamous cell carcinoma of skin 09/18/2008   left temple   T2DM (type 2 diabetes mellitus) ([place])    Thyroid disease    Varicella    Vertigo     Past Surgical History:  Procedure Laterality Date   CATARACT EXTRACTION W/PHACO Left 11/17/2017   Procedure: CATARACT EXTRACTION PHACO AND INTRAOCULAR LENS PLACEMENT (IOC)-LEFT DIABETIC;  Surgeon: Birder Robson, MD;  Location: [facility] ORS;  Service: Ophthalmology;  Laterality: Left;  [place] 00:36 AP% 15.1 CDE 5.46 Fluid pack lot # [id number]   CATARACT EXTRACTION W/PHACO Right 12/22/2017   Procedure: CATARACT EXTRACTION PHACO AND INTRAOCULAR LENS PLACEMENT (IOC);  Surgeon: Birder Robson, MD;  Location: [facility] ORS;  Service: Ophthalmology;  Laterality: Right;  [place] 00:46.1 AP% 12.9 CDE 5.97 Fluid pack lot # [id number]   COLONOSCOPY WITH PROPOFOL N/A 04/24/2016   Procedure: COLONOSCOPY WITH PROPOFOL;  Surgeon: Manya Silvas, MD;  Location: [facility] ENDOSCOPY;  Service: Endoscopy;  Laterality: N/A;   DILATION AND CURETTAGE OF UTERUS  2002   JOINT REPLACEMENT Right    REPLACEMENT TOTAL KNEE Right 02/20/2014   THYROIDECTOMY  2010   TONSILLECTOMY     TONSILLECTOMY AND ADENOIDECTOMY  1950   TOTAL KNEE ARTHROPLASTY  2015   right knee    Allergies   Allergen Reactions   Amoxicillin Hives and Rash    Has patient had a PCN reaction causing immediate rash, facial/tongue/throat swelling, SOB or lightheadedness with hypotension: Yes Has patient had a PCN reaction causing severe rash involving mucus membranes or skin necrosis: Yes Has patient had a PCN reaction that required hospitalization: No Has patient had a PCN reaction occurring within the last 10 years: Yes If all of the above answers are "NO", then may proceed with Cephalosporin use.    Lovastatin Other (See Comments)    myalgias   Penicillins Hives and Rash   Pravastatin Other (See Comments)    Myalgias     Physical Exam: General: The patient is alert and oriented x3 in no acute distress.  Dermatology: There is a small, dry, loosely adhered sanguinous blood blister noted to the distal tip of the left hallux that measures approximately 0.5 cm in diameter.  No erythema.  No drainage.  Very stable and appears to be healing routinely There is also thickened, dystrophic nail noted to the left hallux nail plate consistent with onychomycosis of the toenail  Vascular: Palpable pedal pulses bilaterally. Capillary refill within normal limits.  Negative for any significant edema or erythema  Neurological: Light touch and protective threshold grossly intact  Musculoskeletal Exam: No pedal deformities noted  Assessment: 1.  Dry stable blood blister with routine healing left hallux 2.  Onychomycosis of toenail left  hallux  Plan of Care:  1. Patient evaluated.  2.  Light debridement of the blood blister was performed and it was removed in its entirety.  Underlying healthy viable skin noted 3.  We discussed different treatment options including oral, topical, and laser antifungal treatment modalities.  The patient opts for topical treatment for the toenail fungus. 4. Formula3 antifungal topical was dispensed at checkout apply daily 5.  Return to clinic as needed     Edrick Kins,  DPM [facility]  Dr. Edrick Kins, DPM    [address], [place] [address]                Office [phone]  Fax [phone]

## 2022-02-03 NOTE — Progress Notes (Signed)
02/04/22 10:40 AM   Wendy Barnes Aug 27, 1941 [id number]  Referring provider:  Gwyneth Sprout, [place],  [address] [address] Chief Complaint  Patient presents with   Urinary Incontinence     HPI: Wendy Barnes is a 81 y.o.female who presents today for further evaluation of mixed urinary incontinence.   She has a remote past of smoking, 1 pack every other day for 30 years, quit in in her 102s.  She reports that she experiences the leakage when she stands or after she is in the car. She feels herself needing to urinate sometimes. She has been on oxybutynin and it has not improved and she has not had her dose changed. This has been ongoing.   She has had this issue for many years but seems to be worsening.  She is now up to 3 pads per day.  She does engage in toilet mapping.  She has leakage when laughing, coughing and sneezing, she reports that she has had 2 UTIs over the past calendar year, she takes cranberry tablets, one UTI was associated with burning.   She denies constipation and blood in urine.  She had multiple urine dips which are fairly unremarkable.  Unfortunately not able to give urine today.  She denies any vaginal symptoms.  She had 4 pregnancies.  PMH: Past Medical History:  Diagnosis Date   Anxiety    Cancer ([place])    skin   Hyperchloremia    Hypertension    Hypothyroidism    Squamous cell carcinoma of skin 09/18/2008   left temple   T2DM (type 2 diabetes mellitus) ([place])    Thyroid disease    Varicella    Vertigo     Surgical History: Past Surgical History:  Procedure Laterality Date   CATARACT EXTRACTION W/PHACO Left 11/17/2017   Procedure: CATARACT EXTRACTION PHACO AND INTRAOCULAR LENS PLACEMENT (IOC)-LEFT DIABETIC;  Surgeon: Birder Robson, MD;  Location: [facility] ORS;  Service: Ophthalmology;  Laterality: Left;  [place] 00:36 AP% 15.1 CDE 5.46 Fluid pack lot # [id number]   CATARACT EXTRACTION W/PHACO Right 12/22/2017   Procedure: CATARACT  EXTRACTION PHACO AND INTRAOCULAR LENS PLACEMENT (IOC);  Surgeon: Birder Robson, MD;  Location: [facility] ORS;  Service: Ophthalmology;  Laterality: Right;  [place] 00:46.1 AP% 12.9 CDE 5.97 Fluid pack lot # [id number]   COLONOSCOPY WITH PROPOFOL N/A 04/24/2016   Procedure: COLONOSCOPY WITH PROPOFOL;  Surgeon: Manya Silvas, MD;  Location: [facility] ENDOSCOPY;  Service: Endoscopy;  Laterality: N/A;   DILATION AND CURETTAGE OF UTERUS  2002   JOINT REPLACEMENT Right    REPLACEMENT TOTAL KNEE Right 02/20/2014   THYROIDECTOMY  2010   TONSILLECTOMY     TONSILLECTOMY AND ADENOIDECTOMY  1950   TOTAL KNEE ARTHROPLASTY  2015   right knee    Home Medications:  Allergies as of 02/04/2022       Reactions   Amoxicillin Hives, Rash   Has patient had a PCN reaction causing immediate rash, facial/tongue/throat swelling, SOB or lightheadedness with hypotension: Yes Has patient had a PCN reaction causing severe rash involving mucus membranes or skin necrosis: Yes Has patient had a PCN reaction that required hospitalization: No Has patient had a PCN reaction occurring within the last 10 years: Yes If all of the above answers are "NO", then may proceed with Cephalosporin use.   Lovastatin Other (See Comments)   myalgias   Penicillins Hives, Rash   Pravastatin Other (See Comments)   Myalgias  Medication List        Accurate as of February 04, 2022 10:40 AM. If you have any questions, ask your nurse or doctor.          ALPRAZolam 0.5 MG tablet Commonly known as: XANAX TAKE 0.5 TABLET BY MOUTH EVERY NIGHT AT BEDTIME AS NEEDED FOR SLEEP   amLODipine 5 MG tablet Commonly known as: NORVASC Take 1 tablet (5 mg total) by mouth daily.   benazepril-hydrochlorthiazide 20-12.5 MG tablet Commonly known as: LOTENSIN HCT Take 2 tablets by mouth daily.   Biotin 2500 MCG Caps Take 2,500 mcg by mouth daily.   Calcium Carbonate-Vitamin D 600-10 MG-MCG Tabs Take by mouth.   Co Q10 100 MG Caps Take  by mouth.   Cranberry Plus Vitamin C 4200-20-3 MG-MG-UNIT Caps Generic drug: Cranberry-Vitamin C-Vitamin E Take 1 capsule by mouth daily.   Fish Oil Ultra 1400 MG Caps Take 1,400 mg by mouth daily.   Gemtesa 75 MG Tabs Generic drug: Vibegron Take 75 mg by mouth daily. Started by: Hollice Espy, MD   levothyroxine 88 MCG tablet Commonly known as: SYNTHROID Take 1 tablet (88 mcg total) by mouth daily.   metFORMIN 500 MG tablet Commonly known as: GLUCOPHAGE TAKE ONE TABLET BY MOUTH EVERY MORNING WITH BREAKFAST   oxybutynin 10 MG 24 hr tablet Commonly known as: DITROPAN-XL Take 1 tablet (10 mg total) by mouth at bedtime.   rosuvastatin 5 MG tablet Commonly known as: CRESTOR Take 1 tablet (5 mg total) by mouth daily.   traZODone 50 MG tablet Commonly known as: DESYREL TAKE 1/2 TO 1 TABLET BY MOUTH AT BEDTIME AS NEEDED FOR SLEEP   triamcinolone cream 0.1 % Commonly known as: KENALOG Apply 1 application topically as directed. BID for 2 weeks then decrease to qd 5 times per week. Avoid face, groin, underarms.        Allergies:  Allergies  Allergen Reactions   Amoxicillin Hives and Rash    Has patient had a PCN reaction causing immediate rash, facial/tongue/throat swelling, SOB or lightheadedness with hypotension: Yes Has patient had a PCN reaction causing severe rash involving mucus membranes or skin necrosis: Yes Has patient had a PCN reaction that required hospitalization: No Has patient had a PCN reaction occurring within the last 10 years: Yes If all of the above answers are "NO", then may proceed with Cephalosporin use.    Lovastatin Other (See Comments)    myalgias   Penicillins Hives and Rash   Pravastatin Other (See Comments)    Myalgias    Family History: Family History  Problem Relation Age of Onset   Stroke Father    Breast cancer Neg Hx     Social History:  reports that she quit smoking about 26 years ago. Her smoking use included cigarettes. She  has a 25.00 pack-year smoking history. She has never used smokeless tobacco. She reports current alcohol use. She reports that she does not use drugs.   Physical Exam: BP (!) 154/79    Pulse 75    Ht 5' 5.5" (1.664 m)    Wt 211 lb (95.7 kg)    BMI 34.58 kg/m   Constitutional:  Alert and oriented, No acute distress. HEENT: [place] AT, moist mucus membranes.  Trachea midline, no masses. Cardiovascular: No clubbing, cyanosis, or edema. Respiratory: Normal respiratory effort, no increased work of breathing. Skin: No rashes, bruises or suspicious lesions. Neurologic: Grossly intact, no focal deficits, moving all 4 extremities. Psychiatric: Normal mood and affect.  Laboratory  Data: Lab Results  Component Value Date   CREATININE 1.04 (H) 01/14/2022   Lab Results  Component Value Date   HGBA1C 6.4 (H) 01/14/2022   Pertinent Imaging: Results for orders placed or performed in visit on 02/04/22  BLADDER SCAN AMB NON-IMAGING  Result Value Ref Range   Scan Result 0 ml    Assessment & Plan:   Mixed urinary incontinence  - She is voiding adequately today, PVR minimal - We discussed stress we discussed pelvic floor exercises to strengthen pelvic floor muscles  - We discussed urge incontinence; she is more bothered by this we discussed an addition to her medication in the form of a beta 3 agonist in combination with her anticholinergic.  She was given samples of Gemtesa 75 mg today for a month.  - Will recheck her symptoms in 1 month. We discussed that if this does not improve her symptoms she may benefit from Botox, versus PTNS versus InterStim - We discussed leakage prevention such as avoiding coffee, tea, and anything that is neutral. She would benefit from drinking water - Continue oxybutynin - She was given samples of Gemtesa  - Will check UA in next visit to help rule out underlying conditions although not suspected   F/u 1 month with UA/ PVR  I,Kailey Littlejohn,acting as a scribe for  Hollice Espy, MD.,have documented all relevant documentation on the behalf of Hollice Espy, MD,as directed by  Hollice Espy, MD while in the presence of Hollice Espy, MD.  I have reviewed the above documentation for accuracy and completeness, and I agree with the above.   Hollice Espy, MD   [facility] [address] [place],  [address] [phone]

## 2022-02-04 ENCOUNTER — Other Ambulatory Visit: Payer: Self-pay

## 2022-02-04 ENCOUNTER — Ambulatory Visit (INDEPENDENT_AMBULATORY_CARE_PROVIDER_SITE_OTHER): Payer: Medicare Other | Admitting: Urology

## 2022-02-04 ENCOUNTER — Encounter: Payer: Self-pay | Admitting: Urology

## 2022-02-04 VITALS — BP 154/79 | HR 75 | Ht 65.5 in | Wt 211.0 lb

## 2022-02-04 DIAGNOSIS — N3946 Mixed incontinence: Secondary | ICD-10-CM | POA: Diagnosis not present

## 2022-02-04 LAB — BLADDER SCAN AMB NON-IMAGING: Scan Result: 0

## 2022-02-04 MED ORDER — GEMTESA 75 MG PO TABS: 75.0000 mg | ORAL_TABLET | Freq: Every day | ORAL | 0 refills | Status: DC

## 2022-02-13 ENCOUNTER — Other Ambulatory Visit: Payer: Self-pay | Admitting: Family Medicine

## 2022-02-13 DIAGNOSIS — E1159 Type 2 diabetes mellitus with other circulatory complications: Secondary | ICD-10-CM

## 2022-02-13 NOTE — Telephone Encounter (Signed)
Rx 01/14/22 marked as no print- so did not go to pharmacy- will resend ?Requested Prescriptions  ?Pending Prescriptions Disp Refills  ?? benazepril-hydrochlorthiazide (LOTENSIN HCT) 20-12.5 MG tablet [Pharmacy Med Name: BENAZEPRIL-HCTZ 20-12.5 MG TAB] 180 tablet 1  ?  Sig: TAKE ONE (1) TABLET BY MOUTH TWO TIMES PER DAY  ?  ? Cardiovascular:  ACEI + Diuretic Combos Failed - 02/13/2022  1:55 PM  ?  ?  Failed - Cr in normal range and within 180 days  ?  Creatinine  ?Date Value Ref Range Status  ?02/22/2014 0.58 (L) 0.60 - 1.30 mg/dL Final  ? ?Creatinine, Ser  ?Date Value Ref Range Status  ?01/14/2022 1.04 (H) 0.57 - 1.00 mg/dL Final  ?   ?  ?  Failed - Last BP in normal range  ?  BP Readings from Last 1 Encounters:  ?02/04/22 (!) 154/79  ?   ?  ?  Passed - Na in normal range and within 180 days  ?  Sodium  ?Date Value Ref Range Status  ?01/14/2022 141 134 - 144 mmol/L Final  ?02/22/2014 134 (L) 136 - 145 mmol/L Final  ?   ?  ?  Passed - K in normal range and within 180 days  ?  Potassium  ?Date Value Ref Range Status  ?01/14/2022 4.2 3.5 - 5.2 mmol/L Final  ?02/22/2014 3.9 3.5 - 5.1 mmol/L Final  ?   ?  ?  Passed - eGFR is 30 or above and within 180 days  ?  EGFR (African American)  ?Date Value Ref Range Status  ?02/22/2014 >60  Final  ? ?GFR calc Af Wyvonnia Lora  ?Date Value Ref Range Status  ?01/02/2021 64 >59 mL/min/1.73 Final  ?  Comment:  ?  **In accordance with recommendations from the NKF-ASN Task force,** ?  [facility] is in the process of updating its eGFR calculation to the ?  2021 CKD-EPI creatinine equation that estimates kidney function ?  without a race variable. ?  ? ?EGFR (Non-African Amer.)  ?Date Value Ref Range Status  ?02/22/2014 >60  Final  ?  Comment:  ?  eGFR values <83m/min/1.73 m2 may be an indication of chronic ?kidney disease (CKD). ?Calculated eGFR is useful in patients with stable renal function. ?The eGFR calculation will not be reliable in acutely ill patients ?when serum creatinine is changing  rapidly. It is not useful in  ?patients on dialysis. The eGFR calculation may not be applicable ?to patients at the low and high extremes of body sizes, pregnant ?women, and vegetarians. ?  ? ?GFR calc non Af Amer  ?Date Value Ref Range Status  ?01/02/2021 55 (L) >59 mL/min/1.73 Final  ? ?eGFR  ?Date Value Ref Range Status  ?01/14/2022 55 (L) >59 mL/min/1.73 Final  ?   ?  ?  Passed - Patient is not pregnant  ?  ?  Passed - Valid encounter within last 6 months  ?  Recent Outpatient Visits   ?      ? 1 month ago [facility] blister  [facility]  ? 5 months ago Acute cystitis without hematuria  ? [facility]  ? 7 months ago Medicare annual wellness visit, subsequent  ? [facility]  ? 8 months ago Type 2 diabetes mellitus with diabetic polyneuropathy, without long-term current use of insulin ([place]  ? [facility]  ? 1 year ago Primary  hypertension  ? [place], [place]  ?  ?  ?Future Appointments   ?        ? In 2 weeks Hollice Espy, MD [place]  ? In 4 months Ralene Bathe, MD [place]  ? In 5 months Gwyneth Sprout, [place], [facility]  ?  ? ?  ?  ?  ? ?

## 2022-03-04 NOTE — Progress Notes (Signed)
? ?03/05/22 ?12:30 PM  ? ?Wendy Barnes ?10-Jul-1941 ?[id number] ? ?Referring provider:  ?Gwyneth Sprout, FNP ?[address] [place] ?[place],  [place] [address] ?Chief Complaint  ?Patient presents with  ? Urinary Incontinence  ? ? ? ?HPI: ?Wendy Barnes is a 81 y.o.female with a personal history of mixed urinary incontinence, who presents today for a 1 month follow-up with UA and PVR.  ? ?She is currently managed on oxybutynin. She was given a 1 month trial of Gemtesa.  ? ?She has a remote past of smoking, 1 pack every other day for 30 years, quit in in her 55s. ? ?She reports that she noticed symptomatic improvement on Gemtesa.   She is down from 5 pads to 1 safety pad and very pleased.   ? ?PMH: ?Past Medical History:  ?Diagnosis Date  ? Anxiety   ? Cancer [facility])   ? skin  ? Hyperchloremia   ? Hypertension   ? Hypothyroidism   ? Squamous cell carcinoma of skin 09/18/2008  ? left temple  ? T2DM (type 2 diabetes mellitus) ([place])   ? Thyroid disease   ? Varicella   ? Vertigo   ? ? ?Surgical History: ?Past Surgical History:  ?Procedure Laterality Date  ? CATARACT EXTRACTION W/PHACO Left 11/17/2017  ? Procedure: CATARACT EXTRACTION PHACO AND INTRAOCULAR LENS PLACEMENT (IOC)-LEFT DIABETIC;  Surgeon: Birder Robson, MD;  Location: [facility] ORS;  Service: Ophthalmology;  Laterality: Left;  [place] 00:36 ?AP% 15.1 ?CDE 5.46 ?Fluid pack lot # [id number]  ? CATARACT EXTRACTION W/PHACO Right 12/22/2017  ? Procedure: CATARACT EXTRACTION PHACO AND INTRAOCULAR LENS PLACEMENT (IOC);  Surgeon: Birder Robson, MD;  Location: [facility] ORS;  Service: Ophthalmology;  Laterality: Right;  [place] 00:46.1 ?AP% 12.9 ?CDE 5.97 ?Fluid pack lot # [id number]  ? COLONOSCOPY WITH PROPOFOL N/A 04/24/2016  ? Procedure: COLONOSCOPY WITH PROPOFOL;  Surgeon: Manya Silvas, MD;  Location: [facility] ENDOSCOPY;  Service: Endoscopy;  Laterality: N/A;  ? [place] OF UTERUS  2002  ? JOINT REPLACEMENT Right   ? REPLACEMENT TOTAL KNEE Right 02/20/2014  ? THYROIDECTOMY  2010  ?  TONSILLECTOMY    ? TONSILLECTOMY AND ADENOIDECTOMY  1950  ? TOTAL KNEE ARTHROPLASTY  2015  ? right knee  ? ? ?Home Medications:  ?Allergies as of 03/05/2022   ? ?   Reactions  ? Amoxicillin Hives, Rash  ? Has patient had a PCN reaction causing immediate rash, facial/tongue/throat swelling, SOB or lightheadedness with hypotension: Yes ?Has patient had a PCN reaction causing severe rash involving mucus membranes or skin necrosis: Yes ?Has patient had a PCN reaction that required hospitalization: No ?Has patient had a PCN reaction occurring within the last 10 years: Yes ?If all of the above answers are "NO", then may proceed with Cephalosporin use.  ? Lovastatin Other (See Comments)  ? myalgias  ? Penicillins Hives, Rash  ? Pravastatin Other (See Comments)  ? Myalgias  ? ?  ? ?  ?Medication List  ?  ? ?  ? Accurate as of March 05, 2022 11:59 PM. If you have any questions, ask your nurse or doctor.  ?  ?  ? ?  ? ?ALPRAZolam 0.5 MG tablet ?Commonly known as: Duanne Moron ?TAKE 0.5 TABLET BY MOUTH EVERY NIGHT AT BEDTIME AS NEEDED FOR SLEEP ?  ?amLODipine 5 MG tablet ?Commonly known as: NORVASC ?Take 1 tablet (5 mg total) by mouth daily. ?  ?benazepril-hydrochlorthiazide 20-12.5 MG tablet ?Commonly known as: LOTENSIN HCT ?TAKE ONE (1) TABLET BY MOUTH TWO TIMES  PER DAY ?  ?Biotin 2500 MCG Caps ?Take 2,500 mcg by mouth daily. ?  ?Calcium Carbonate-Vitamin D 600-10 MG-MCG Tabs ?Take by mouth. ?  ?Co Q10 100 MG Caps ?Take by mouth. ?  ?Cranberry Plus Vitamin C 4200-20-3 MG-MG-UNIT Caps ?Generic drug: Cranberry-Vitamin C-Vitamin E ?Take 1 capsule by mouth daily. ?  ?Fish Oil Ultra 1400 MG Caps ?Take 1,400 mg by mouth daily. ?  ?Gemtesa 75 MG Tabs ?Generic drug: Vibegron ?Take 75 mg by mouth daily. ?  ?levothyroxine 88 MCG tablet ?Commonly known as: SYNTHROID ?Take 1 tablet (88 mcg total) by mouth daily. ?  ?metFORMIN 500 MG tablet ?Commonly known as: GLUCOPHAGE ?TAKE ONE TABLET BY MOUTH EVERY MORNING WITH BREAKFAST ?  ?oxybutynin 10 MG  24 hr tablet ?Commonly known as: DITROPAN-XL ?Take 1 tablet (10 mg total) by mouth at bedtime. ?  ?rosuvastatin 5 MG tablet ?Commonly known as: CRESTOR ?Take 1 tablet (5 mg total) by mouth daily. ?  ?traZODone 50 MG tablet ?Commonly known as: DESYREL ?TAKE 1/2 TO 1 TABLET BY MOUTH AT BEDTIME AS NEEDED FOR SLEEP ?  ?triamcinolone cream 0.1 % ?Commonly known as: KENALOG ?Apply 1 application topically as directed. BID for 2 weeks then decrease to qd 5 times per week. Avoid face, groin, underarms. ?  ? ?  ? ? ?Allergies:  ?Allergies  ?Allergen Reactions  ? Amoxicillin Hives and Rash  ?  Has patient had a PCN reaction causing immediate rash, facial/tongue/throat swelling, SOB or lightheadedness with hypotension: Yes ?Has patient had a PCN reaction causing severe rash involving mucus membranes or skin necrosis: Yes ?Has patient had a PCN reaction that required hospitalization: No ?Has patient had a PCN reaction occurring within the last 10 years: Yes ?If all of the above answers are "NO", then may proceed with Cephalosporin use. ?  ? Lovastatin Other (See Comments)  ?  myalgias  ? Penicillins Hives and Rash  ? Pravastatin Other (See Comments)  ?  Myalgias  ? ? ?Family History: ?Family History  ?Problem Relation Age of Onset  ? Stroke Father   ? Breast cancer Neg Hx   ? ? ?Social History:  reports that she quit smoking about 26 years ago. Her smoking use included cigarettes. She has a 25.00 pack-year smoking history. She has never used smokeless tobacco. She reports current alcohol use. She reports that she does not use drugs. ? ? ?Physical Exam: ?Constitutional:  Alert and oriented, No acute distress. ?HEENT: [place] AT, moist mucus membranes.  Trachea midline, no masses. ?Cardiovascular: No clubbing, cyanosis, or edema. ?Respiratory: Normal respiratory effort, no increased work of breathing. ?Skin: No rashes, bruises or suspicious lesions. ?Neurologic: Grossly intact, no focal deficits, moving all 4 extremities. ?Psychiatric:  Normal mood and affect. ? ?Laboratory Data: ?Lab Results  ?Component Value Date  ? CREATININE 1.04 (H) 01/14/2022  ? ?Lab Results  ?Component Value Date  ? HGBA1C 6.4 (H) 01/14/2022  ? ? ?Urinalysis ?UA negative with many bacteria likely contaminated  ? ?Pertinent Imaging: ?Results for orders placed or performed in visit on 03/05/22  ?Microscopic Examination  ? Urine  ?Result Value Ref Range  ? WBC, UA 0-5 0 - 5 /hpf  ? RBC 0-2 0 - 2 /hpf  ? Epithelial Cells (non renal) >10 (A) 0 - 10 /hpf  ? Bacteria, UA Many (A) None seen/Few  ?Urinalysis, Complete  ?Result Value Ref Range  ? Specific Gravity, UA 1.020 1.005 - 1.030  ? pH, UA 7.5 5.0 - 7.5  ? Color,  UA Yellow Yellow  ? Appearance Ur Cloudy (A) Clear  ? Leukocytes,UA Negative Negative  ? Protein,UA Negative Negative/Trace  ? Glucose, UA Negative Negative  ? Ketones, UA Negative Negative  ? RBC, UA Negative Negative  ? Bilirubin, UA Negative Negative  ? Urobilinogen, Ur 0.2 0.2 - 1.0 mg/dL  ? Nitrite, UA Negative Negative  ? Microscopic Examination See below:   ?Bladder Scan (Post Void Residual) in office  ?Result Value Ref Range  ? Scan Result 10   ? ?Assessment & Plan:   ?1.  Mixed urinary incontinence ?-She is emptying adequately today with a PVR of 60m ?- She had symptomatic improvement on Gemtesa  ?- Continue Gemtesa and oxybutynin (dual therapy) ?-Logan Bores prescribed  ?- UA today negative with few bacteria likely contaminated  ? ?Return in 1 year for symptoms recheck ? ?IConley Rollsas a scribe for AHollice Espy MD.,have documented all relevant documentation on the behalf of AHollice Espy MD,as directed by  AHollice Espy MD while in the presence of AHollice Espy MD. ? ?I have reviewed the above documentation for accuracy and completeness, and I agree with the above.  ? ?AHollice Espy MD ? ? ?[place]?[address] ?[place] [address]?[phone]?

## 2022-03-05 ENCOUNTER — Ambulatory Visit (INDEPENDENT_AMBULATORY_CARE_PROVIDER_SITE_OTHER): Payer: Medicare Other | Admitting: Urology

## 2022-03-05 ENCOUNTER — Encounter: Payer: Self-pay | Admitting: Urology

## 2022-03-05 ENCOUNTER — Other Ambulatory Visit: Payer: Self-pay

## 2022-03-05 DIAGNOSIS — N3946 Mixed incontinence: Secondary | ICD-10-CM | POA: Diagnosis not present

## 2022-03-05 LAB — BLADDER SCAN AMB NON-IMAGING: Scan Result: 10

## 2022-03-05 MED ORDER — GEMTESA 75 MG PO TABS: 75.0000 mg | ORAL_TABLET | Freq: Every day | ORAL | 11 refills | Status: DC

## 2022-03-06 ENCOUNTER — Telehealth: Payer: Self-pay | Admitting: Urology

## 2022-03-06 LAB — URINALYSIS, COMPLETE
Bilirubin, UA: NEGATIVE
Glucose, UA: NEGATIVE
Ketones, UA: NEGATIVE
Leukocytes,UA: NEGATIVE
Nitrite, UA: NEGATIVE
Protein,UA: NEGATIVE
RBC, UA: NEGATIVE
Specific Gravity, UA: 1.02 (ref 1.005–1.030)
Urobilinogen, Ur: 0.2 mg/dL (ref 0.2–1.0)
pH, UA: 7.5 (ref 5.0–7.5)

## 2022-03-06 LAB — MICROSCOPIC EXAMINATION: Epithelial Cells (non renal): >10 /hpf — AB (ref 0–10)

## 2022-03-06 MED ORDER — GEMTESA 75 MG PO TABS: 75.0000 mg | ORAL_TABLET | Freq: Every day | ORAL | 11 refills | Status: DC

## 2022-03-06 NOTE — Telephone Encounter (Signed)
Pt LM on triage line requesting call back.  ?

## 2022-03-06 NOTE — Telephone Encounter (Signed)
Left patient a VM, will send prescription to [facility]. ?

## 2022-03-06 NOTE — Telephone Encounter (Signed)
Patient called and stated that the prescription Gemtesa that was sent yesterday is too expensive.  She stated that the cost was 300.  Patient is asking if there is a similar drug that can be sent to [facility]. ?

## 2022-03-07 DIAGNOSIS — Z860100 Personal history of colon polyps, unspecified: Secondary | ICD-10-CM | POA: Insufficient documentation

## 2022-03-10 NOTE — Telephone Encounter (Signed)
Pt called office and I let her know [facility] was sent in to pharmacy. ?

## 2022-03-10 NOTE — Telephone Encounter (Signed)
Left Vm to return call 

## 2022-03-13 ENCOUNTER — Other Ambulatory Visit: Payer: Self-pay | Admitting: Family Medicine

## 2022-03-13 DIAGNOSIS — E1142 Type 2 diabetes mellitus with diabetic polyneuropathy: Secondary | ICD-10-CM

## 2022-03-18 ENCOUNTER — Telehealth: Payer: Self-pay | Admitting: Urology

## 2022-03-18 NOTE — Telephone Encounter (Signed)
Patient called today and said that she is not able to get the [facility] from the pharmacy. She is asking for you to call her back to discuss. ? ?Thanks, ?Sharyn Lull ?

## 2022-03-18 NOTE — Telephone Encounter (Signed)
Spoke with patient, provided samples to pick up. Waiting on rep for [employer] to process for [facility]. Voiced understanding.  ?

## 2022-03-21 ENCOUNTER — Other Ambulatory Visit: Payer: Self-pay | Admitting: Family Medicine

## 2022-03-21 ENCOUNTER — Telehealth: Payer: Self-pay | Admitting: *Deleted

## 2022-03-21 DIAGNOSIS — E034 Atrophy of thyroid (acquired): Secondary | ICD-10-CM

## 2022-03-21 NOTE — Chronic Care Management (AMB) (Signed)
?  Care Management  ? ?Note ? ?03/21/2022 ?Name: Wendy Barnes MRN: [id number] DOB: 09-Apr-1941 ? ?Wendy Barnes is a 81 y.o. year old female who is a primary care patient of Gwyneth Sprout, FNP. I reached out to [employer] by phone today offer care coordination services.  ? ?Wendy Barnes was given information about care management services today including:  ?Care management services include personalized support from designated clinical staff supervised by her physician, including individualized plan of care and coordination with other care providers ?24/7 contact phone numbers for assistance for urgent and routine care needs. ?The patient may stop care management services at any time by phone call to the office staff. ? ?Patient agreed to services and verbal consent obtained.  ? ?Follow up plan: ?Telephone appointment with care management team member scheduled for:03/28/22 ? ?Wendy Barnes  ?Care Guide, Embedded Care Coordination ?[place]  Care Management  ?Direct Dial: [phone] ? ?

## 2022-03-28 ENCOUNTER — Telehealth: Payer: Medicare Other

## 2022-03-28 ENCOUNTER — Telehealth: Payer: Self-pay

## 2022-03-28 ENCOUNTER — Other Ambulatory Visit: Payer: Self-pay | Admitting: Family Medicine

## 2022-03-28 DIAGNOSIS — I10 Essential (primary) hypertension: Secondary | ICD-10-CM

## 2022-03-28 NOTE — Telephone Encounter (Signed)
?  Care Management  ? ?Follow Up Note ? ? ?03/28/2022 ?Name: Wendy Barnes MRN: [id number] DOB: 1941/07/07 ? ? ?Primary Care Primary: Gwyneth Sprout, FNP ?Reason for referral : Chronic Care Management ? ? ?An unsuccessful telephone outreach was attempted today. The patient was referred to the case management team for assistance with care management and care coordination.  ? ? ?Follow Up Plan:  ?Phone rang multiple times without option to leave a voice message. ?A member of the care manager team will attempt to reach [name] within the next two weeks. ? ? ?Special Ranes,RN ?[place]/[facility] Care Management ?[place] ?[phone] ?

## 2022-03-31 ENCOUNTER — Other Ambulatory Visit: Payer: Self-pay

## 2022-03-31 DIAGNOSIS — N3281 Overactive bladder: Secondary | ICD-10-CM

## 2022-03-31 MED ORDER — OXYBUTYNIN CHLORIDE ER 10 MG PO TB24: 10.0000 mg | ORAL_TABLET | Freq: Every day | ORAL | 1 refills | Status: DC

## 2022-03-31 NOTE — Telephone Encounter (Signed)
Requested Prescriptions  ?Pending Prescriptions Disp Refills  ?? oxybutynin (DITROPAN-XL) 10 MG 24 hr tablet 90 tablet 1  ?  Sig: Take 1 tablet (10 mg total) by mouth at bedtime.  ?  ? Urology:  Bladder Agents Passed - 03/31/2022  8:33 AM  ?  ?  Passed - Valid encounter within last 12 months  ?  Recent Outpatient Visits   ?      ? 2 months ago Blood blister  ? [facility] Gwyneth Sprout, FNP  ? 7 months ago Acute cystitis without hematuria  ? [facility] Caryn Section, Kirstie Peri, MD  ? 8 months ago Medicare annual wellness visit, subsequent  ? [facility] [place], Dionne Bucy, MD  ? 10 months ago Type 2 diabetes mellitus with diabetic polyneuropathy, without long-term current use of insulin ([place])  ? [facility] Jerrol Banana., MD  ? 1 year ago Primary hypertension  ? [place], [place]  ?  ?  ?Future Appointments   ?        ? In 2 months Ralene Bathe, MD [place]  ? In 3 months Gwyneth Sprout, [place], [place]  ? In 11 months Luana Shu [place]  ?  ? ?  ?  ?  ? ? ?

## 2022-03-31 NOTE — Telephone Encounter (Signed)
Requested Prescriptions  ?Pending Prescriptions Disp Refills  ?? amLODipine (NORVASC) 5 MG tablet [Pharmacy Med Name: AMLODIPINE BESYLATE 5 MG TAB] 90 tablet 0  ?  Sig: TAKE 1 TABLET BY MOUTH ONCE DAILY  ?  ? Cardiovascular: Calcium Channel Blockers 2 Failed - 03/28/2022  4:23 PM  ?  ?  Failed - Last BP in normal range  ?  BP Readings from Last 1 Encounters:  ?02/04/22 (!) 154/79  ?   ?  ?  Passed - Last Heart Rate in normal range  ?  Pulse Readings from Last 1 Encounters:  ?02/04/22 75  ?   ?  ?  Passed - Valid encounter within last 6 months  ?  Recent Outpatient Visits   ?      ? 2 months ago Blood blister  ? [facility] Gwyneth Sprout, FNP  ? 7 months ago Acute cystitis without hematuria  ? [facility] Caryn Section, Kirstie Peri, MD  ? 8 months ago Medicare annual wellness visit, subsequent  ? [facility] [place], Dionne Bucy, MD  ? 10 months ago Type 2 diabetes mellitus with diabetic polyneuropathy, without long-term current use of insulin ([place])  ? [facility] Jerrol Banana., MD  ? 1 year ago Primary hypertension  ? [place], [place]  ?  ?  ?Future Appointments   ?        ? In 2 months Ralene Bathe, MD [place]  ? In 3 months Gwyneth Sprout, [place], [place]  ? In 11 months Luana Shu [place]  ?  ? ?  ?  ?  ?? traZODone (DESYREL) 50 MG tablet [Pharmacy Med Name: TRAZODONE HCL 50 MG TAB] 30 tablet 1  ?  Sig: TAKE ONE-HALF TO ONE TABLET BY MOUTH AT BEDTIME AS NEEDED FOR SLEEP  ?  ? Psychiatry: Antidepressants - Serotonin Modulator Passed - 03/28/2022  4:23 PM  ?  ?  Passed - Valid encounter within last 6 months  ?  Recent Outpatient Visits   ?      ? 2 months ago Blood blister  ? [facility] Gwyneth Sprout, FNP  ? 7 months ago Acute cystitis without hematuria  ? [facility] Caryn Section, Kirstie Peri, MD  ? 8 months ago  Medicare annual wellness visit, subsequent  ? [facility] [place], Dionne Bucy, MD  ? 10 months ago Type 2 diabetes mellitus with diabetic polyneuropathy, without long-term current use of insulin ([place])  ? [facility] Jerrol Banana., MD  ? 1 year ago Primary hypertension  ? [place], [place]  ?  ?  ?Future Appointments   ?        ? In 2 months Ralene Bathe, MD [place]  ? In 3 months Gwyneth Sprout, [place], [place]  ? In 11 months Luana Shu [place]  ?  ? ?  ?  ?  ? ? ?

## 2022-03-31 NOTE — Telephone Encounter (Signed)
Pt called and states that she would like a refill for oxybutynin (DITROPAN-XL) 10 MG 24 hr tablet. Sent to [facility] pharmacy. ? ? ? ? ? ? ? ? ? ? ? ? ? ? ? ? ? ? ?

## 2022-04-07 ENCOUNTER — Ambulatory Visit: Payer: Medicare Other

## 2022-04-07 DIAGNOSIS — E1142 Type 2 diabetes mellitus with diabetic polyneuropathy: Secondary | ICD-10-CM

## 2022-04-21 NOTE — Chronic Care Management (AMB) (Signed)
? Care Management ?  ? RN Visit Note ? ?Name: Wendy Barnes MRN: [id number] DOB: 1941-04-15 ? ?Subjective: ?Wendy Barnes is a 81 y.o. year old female who is a primary care patient of Gwyneth Sprout, FNP. The care management team was consulted for assistance with disease management and care coordination needs.   ? ?Engaged with patient by telephone for initial visit in response to provider referral for case management and/or care coordination services.  ? ?Consent to Services:  ? Wendy Barnes was given information about Care Management services including:  ?Care Management services includes personalized support from designated clinical staff supervised by her physician, including individualized plan of care and coordination with other care providers ?24/7 contact phone numbers for assistance for urgent and routine care needs. ?The patient may stop case management services at any time by phone call to the office staff. ? ?Patient agreed to services and consent obtained.  ? ?Assessment: Review of patient past medical history, allergies, medications, health status, including review of consultants reports, laboratory and other test data, was performed as part of comprehensive evaluation and provision of chronic care management services.  ? ?SDOH (Social Determinants of Health) assessments and interventions performed:  ?SDOH Interventions   ? ?Flowsheet Row Most Recent Value  ?SDOH Interventions   ?Food Insecurity Interventions Intervention Not Indicated  ?Transportation Interventions Intervention Not Indicated  ? ?  ?  ? ?Care Plan ? ?Allergies  ?Allergen Reactions  ? Amoxicillin Hives and Rash  ?  Has patient had a PCN reaction causing immediate rash, facial/tongue/throat swelling, SOB or lightheadedness with hypotension: Yes ?Has patient had a PCN reaction causing severe rash involving mucus membranes or skin necrosis: Yes ?Has patient had a PCN reaction that required hospitalization: No ?Has patient had a PCN  reaction occurring within the last 10 years: Yes ?If all of the above answers are "NO", then may proceed with Cephalosporin use. ?  ? Lovastatin Other (See Comments)  ?  myalgias  ? Penicillins Hives and Rash  ? Pravastatin Other (See Comments)  ?  Myalgias  ? ? ?Outpatient Encounter Medications as of 04/07/2022  ?Medication Sig Note  ? amLODipine (NORVASC) 5 MG tablet TAKE 1 TABLET BY MOUTH ONCE DAILY   ? benazepril-hydrochlorthiazide (LOTENSIN HCT) 20-12.5 MG tablet TAKE ONE (1) TABLET BY MOUTH TWO TIMES PER DAY 04/07/2022: Reports taking two tabs once a day  ? Biotin 2500 MCG CAPS Take 2,500 mcg by mouth daily.   ? Calcium Carbonate-Vitamin D 600-10 MG-MCG TABS Take by mouth.   ? Coenzyme Q10 (CO Q10) 100 MG CAPS Take by mouth.   ? Cranberry-Vitamin C-Vitamin E (CRANBERRY PLUS VITAMIN C) 4200-20-3 MG-MG-UNIT CAPS Take 1 capsule by mouth daily.   ? levothyroxine (SYNTHROID) 88 MCG tablet TAKE 1 TABLET EVERY DAY ON EMPTY STOMACHWITH A GLASS OF WATER AT LEAST 30-60 MINBEFORE BREAKFAST   ? metFORMIN (GLUCOPHAGE) 500 MG tablet TAKE ONE TABLET (500 MG) BY MOUTH EVERY MORNING WITH BREAKFAST   ? Omega-3 Fatty Acids (FISH OIL ULTRA) 1400 MG CAPS Take 1,400 mg by mouth daily.   ? oxybutynin (DITROPAN-XL) 10 MG 24 hr tablet Take 1 tablet (10 mg total) by mouth at bedtime.   ? rosuvastatin (CRESTOR) 5 MG tablet Take 1 tablet (5 mg total) by mouth daily.   ? traZODone (DESYREL) 50 MG tablet TAKE ONE-HALF TO ONE TABLET BY MOUTH AT BEDTIME AS NEEDED FOR SLEEP   ? ALPRAZolam (XANAX) 0.5 MG tablet TAKE 0.5 TABLET BY MOUTH EVERY NIGHT AT  BEDTIME AS NEEDED FOR SLEEP (Patient not taking: Reported on 04/07/2022)   ? triamcinolone cream (KENALOG) 0.1 % Apply 1 application topically as directed. BID for 2 weeks then decrease to qd 5 times per week. Avoid face, groin, underarms. (Patient not taking: Reported on 04/07/2022)   ? Vibegron (GEMTESA) 75 MG TABS Take 75 mg by mouth daily.   ? ?No facility-administered encounter medications on  file as of 04/07/2022.  ? ? ?Patient Active Problem List  ? Diagnosis Date Noted  ? Blood blister 01/14/2022  ? Thick nail 01/14/2022  ? Nail fungus 01/14/2022  ? Bladder spasms 01/14/2022  ? Colon cancer screening 01/14/2022  ? Hypothyroidism 02/11/2018  ? Osteoarthritis of spine with radiculopathy, lumbar region 04/16/2016  ? 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 Colon polyp 05/16/2015  ? Type 2 diabetes mellitus with diabetic polyneuropathy, without long-term current use of insulin ([place]) 05/16/2015  ? Gonalgia 05/16/2015  ? Plantar fasciitis 05/16/2015  ? 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 Vaginal lesion 05/16/2015  ? History of diverticulitis of colon 05/16/2015  ? Burning or prickling sensation 04/10/2010  ? Cannot sleep 12/10/2007  ? Dermatologic disease 09/15/2007  ? BP (high blood pressure) 08/28/2006  ? Hypercholesteremia 08/28/2006  ? Mixed incontinence 08/28/2006  ? ? ? ? ?PLAN:  ?Wendy Barnes will contact the clinic if she requires additional care management needs. The care management team will gladly assist. ? ? ?Wendy Lohmann,RN ?[place]/[facility] Care Management ?[place] ?[phone] ? ? ? ? ? ? ? ? ?

## 2022-05-05 ENCOUNTER — Other Ambulatory Visit: Payer: Self-pay | Admitting: Family Medicine

## 2022-06-09 ENCOUNTER — Other Ambulatory Visit: Payer: Self-pay | Admitting: Family Medicine

## 2022-06-09 DIAGNOSIS — E1142 Type 2 diabetes mellitus with diabetic polyneuropathy: Secondary | ICD-10-CM

## 2022-06-18 ENCOUNTER — Encounter: Payer: Self-pay | Admitting: Gastroenterology

## 2022-06-18 NOTE — H&P (Signed)
Pre-Procedure H&P   Patient ID: Wendy Barnes is a 81 y.o. female.  Gastroenterology Provider: Jaynie Collins, DO  Referring Provider: Fransico Setters, NP PCP: Jacky Kindle, FNP  Date: 06/19/2022  HPI Ms. Wendy Barnes is a 81 y.o. female who presents today for Colonoscopy for Surveillance-personal history of colon polyps. Patient's last colonoscopy in 2017 demonstrating 2 tubular adenomas, diverticulosis and internal hemorrhoids. 2012 colonoscopy with 1 TA and other above findings. Most recent lab work creatinine 1.04 A1c 6.4  Regular bowel movements per patient without melena hematochezia diarrhea or constipation  Status post thyroidectomy MGM- CRC No other family history of colorectal cancer or colon polyps   Past Medical History:  Diagnosis Date   Anxiety    Cancer (HCC)    skin   Hyperchloremia    Hyperlipidemia    Hypertension    Hypothyroidism    Squamous cell carcinoma of skin 09/18/2008   left temple   T2DM (type 2 diabetes mellitus) (HCC)    Thyroid disease    Varicella    Vertigo     Past Surgical History:  Procedure Laterality Date   CATARACT EXTRACTION W/PHACO Left 11/17/2017   Procedure: CATARACT EXTRACTION PHACO AND INTRAOCULAR LENS PLACEMENT (IOC)-LEFT DIABETIC;  Surgeon: Galen Manila, MD;  Location: [facility] ORS;  Service: Ophthalmology;  Laterality: Left;  [place] 00:36 AP% 15.1 CDE 5.46 Fluid pack lot # [id number]   CATARACT EXTRACTION W/PHACO Right 12/22/2017   Procedure: CATARACT EXTRACTION PHACO AND INTRAOCULAR LENS PLACEMENT (IOC);  Surgeon: Galen Manila, MD;  Location: [facility] ORS;  Service: Ophthalmology;  Laterality: Right;  [place] 00:46.1 AP% 12.9 CDE 5.97 Fluid pack lot # [id number]   COLONOSCOPY WITH PROPOFOL N/A 04/24/2016   Procedure: COLONOSCOPY WITH PROPOFOL;  Surgeon: Scot Jun, MD;  Location: [facility] ENDOSCOPY;  Service: Endoscopy;  Laterality: N/A;   DILATION AND CURETTAGE OF UTERUS  2002   JOINT REPLACEMENT Right    REPLACEMENT  TOTAL KNEE Right 02/20/2014   THYROIDECTOMY  2010   TONSILLECTOMY     TONSILLECTOMY AND ADENOIDECTOMY  1950   TOTAL KNEE ARTHROPLASTY  2015   right knee    Family History MGM- CRC No other h/o GI disease or malignancy  Review of Systems  Constitutional:  Negative for activity change, appetite change, chills, diaphoresis, fatigue, fever and unexpected weight change.  HENT:  Negative for trouble swallowing and voice change.   Respiratory:  Negative for shortness of breath and wheezing.   Cardiovascular:  Negative for chest pain, palpitations and leg swelling.  Gastrointestinal:  Negative for abdominal distention, abdominal pain, anal bleeding, blood in stool, constipation, diarrhea, nausea, rectal pain and vomiting.  Musculoskeletal:  Negative for arthralgias and myalgias.  Skin:  Negative for color change and pallor.  Neurological:  Negative for dizziness, syncope and weakness.  Psychiatric/Behavioral:  Negative for confusion.   All other systems reviewed and are negative.    Medications No current facility-administered medications on file prior to encounter.   Current Outpatient Medications on File Prior to Encounter  Medication Sig Dispense Refill   benazepril-hydrochlorthiazide (LOTENSIN HCT) 20-12.5 MG tablet TAKE ONE (1) TABLET BY MOUTH TWO TIMES PER DAY 180 tablet 1   Biotin 2500 MCG CAPS Take 2,500 mcg by mouth daily.     Calcium Carbonate-Vitamin D 600-10 MG-MCG TABS Take by mouth.     celecoxib (CELEBREX) 100 MG capsule Take 100 mg by mouth 2 (two) times daily.     Coenzyme Q10 (CO Q10) 100 MG CAPS Take by  mouth.     Cranberry-Vitamin C-Vitamin E (CRANBERRY PLUS VITAMIN C) 4200-20-3 MG-MG-UNIT CAPS Take 1 capsule by mouth daily.     Multiple Vitamin (MULTIVITAMIN) tablet Take 1 tablet by mouth daily.     rosuvastatin (CRESTOR) 5 MG tablet Take 1 tablet (5 mg total) by mouth daily. 90 tablet 0   Vibegron (GEMTESA) 75 MG TABS Take 75 mg by mouth daily. 30 tablet 11    ALPRAZolam (XANAX) 0.5 MG tablet TAKE 0.5 TABLET BY MOUTH EVERY NIGHT AT BEDTIME AS NEEDED FOR SLEEP (Patient not taking: Reported on 04/07/2022) 45 tablet 1   Omega-3 Fatty Acids (FISH OIL ULTRA) 1400 MG CAPS Take 1,400 mg by mouth daily.     triamcinolone cream (KENALOG) 0.1 % Apply 1 application topically as directed. BID for 2 weeks then decrease to qd 5 times per week. Avoid face, groin, underarms. (Patient not taking: Reported on 04/07/2022) 80 g 2    Pertinent medications related to GI and procedure were reviewed by me with the patient prior to the procedure   Current Facility-Administered Medications:    0.9 %  sodium chloride infusion, , Intravenous, Continuous, Jaynie Collins, DO, Last Rate: 20 mL/hr at 06/19/22 0855, New Bag at 06/19/22 0855      Allergies  Allergen Reactions   Amoxicillin Hives, Itching and Rash    Has patient had a PCN reaction causing immediate rash, facial/tongue/throat swelling, SOB or lightheadedness with hypotension: Yes Has patient had a PCN reaction causing severe rash involving mucus membranes or skin necrosis: Yes Has patient had a PCN reaction that required hospitalization: No Has patient had a PCN reaction occurring within the last 10 years: Yes If all of the above answers are "NO", then may proceed with Cephalosporin use.    Lovastatin Other (See Comments)    myalgias   Penicillins Hives and Rash   Pravastatin Other (See Comments)    Myalgias   Allergies were reviewed by me prior to the procedure  Objective   Body mass index is 34.95 kg/m. Vitals:   06/19/22 0852  BP: (!) 145/82  Pulse: 62  Resp: 18  Temp: (!) 96.5 F (35.8 C)  TempSrc: Temporal  SpO2: 98%  Weight: 95.3 kg  Height: 5\' 5"  (1.651 m)     Physical Exam Vitals and nursing note reviewed.  Constitutional:      General: She is not in acute distress.    Appearance: Normal appearance. She is obese. She is not ill-appearing, toxic-appearing or diaphoretic.   HENT:     Head: Normocephalic and atraumatic.     Nose: Nose normal.     Mouth/Throat:     Mouth: Mucous membranes are moist.     Pharynx: Oropharynx is clear.  Eyes:     General: No scleral icterus.    Extraocular Movements: Extraocular movements intact.  Cardiovascular:     Rate and Rhythm: Normal rate and regular rhythm.     Heart sounds: Normal heart sounds. No murmur heard.    No friction rub. No gallop.  Pulmonary:     Effort: Pulmonary effort is normal. No respiratory distress.     Breath sounds: Normal breath sounds. No wheezing, rhonchi or rales.  Abdominal:     General: Bowel sounds are normal. There is no distension.     Palpations: Abdomen is soft.     Tenderness: There is no abdominal tenderness. There is no guarding or rebound.  Musculoskeletal:     Cervical back: Neck supple.  Right lower leg: No edema.     Left lower leg: No edema.  Skin:    General: Skin is warm and dry.     Coloration: Skin is not jaundiced or pale.  Neurological:     General: No focal deficit present.     Mental Status: She is alert and oriented to person, place, and time. Mental status is at baseline.  Psychiatric:        Mood and Affect: Mood normal.        Behavior: Behavior normal.        Thought Content: Thought content normal.        Judgment: Judgment normal.      Assessment:  Ms. Wendy Barnes is a 81 y.o. female  who presents today for Colonoscopy for Surveillance-personal history of colon polyps.  Plan:  Colonoscopy with possible intervention today  Colonoscopy with possible biopsy, control of bleeding, polypectomy, and interventions as necessary has been discussed with the patient/patient representative. Informed consent was obtained from the patient/patient representative after explaining the indication, nature, and risks of the procedure including but not limited to death, bleeding, perforation, missed neoplasm/lesions, cardiorespiratory compromise, and reaction to  medications. Opportunity for questions was given and appropriate answers were provided. Patient/patient representative has verbalized understanding is amenable to undergoing the procedure.   Jaynie Collins, DO  [facility] Gastroenterology  Portions of the record may have been created with voice recognition software. Occasional wrong-word or 'sound-a-like' substitutions may have occurred due to the inherent limitations of voice recognition software.  Read the chart carefully and recognize, using context, where substitutions may have occurred.

## 2022-06-19 ENCOUNTER — Encounter
Admission: RE | Disposition: A | Discharge status: Home / Self Care | Payer: Self-pay | Source: Home / Self Care | Attending: Gastroenterology

## 2022-06-19 ENCOUNTER — Ambulatory Visit
Admission: RE | Admit: 2022-06-19 | Discharge: 2022-06-19 | Disposition: A | Discharge status: Home / Self Care | Payer: Medicare Other | Attending: Gastroenterology | Admitting: Gastroenterology

## 2022-06-19 ENCOUNTER — Other Ambulatory Visit: Payer: Self-pay

## 2022-06-19 ENCOUNTER — Ambulatory Visit: Payer: Medicare Other | Admitting: Certified Registered"

## 2022-06-19 ENCOUNTER — Encounter: Payer: Self-pay | Admitting: Gastroenterology

## 2022-06-19 DIAGNOSIS — I10 Essential (primary) hypertension: Secondary | ICD-10-CM | POA: Insufficient documentation

## 2022-06-19 DIAGNOSIS — K589 Irritable bowel syndrome without diarrhea: Secondary | ICD-10-CM | POA: Diagnosis not present

## 2022-06-19 DIAGNOSIS — Z87891 Personal history of nicotine dependence: Secondary | ICD-10-CM | POA: Insufficient documentation

## 2022-06-19 DIAGNOSIS — Z09 Encounter for follow-up examination after completed treatment for conditions other than malignant neoplasm: Secondary | ICD-10-CM | POA: Insufficient documentation

## 2022-06-19 DIAGNOSIS — K635 Polyp of colon: Secondary | ICD-10-CM | POA: Insufficient documentation

## 2022-06-19 DIAGNOSIS — F419 Anxiety disorder, unspecified: Secondary | ICD-10-CM | POA: Diagnosis not present

## 2022-06-19 DIAGNOSIS — Z1211 Encounter for screening for malignant neoplasm of colon: Secondary | ICD-10-CM | POA: Diagnosis not present

## 2022-06-19 DIAGNOSIS — K640 First degree hemorrhoids: Secondary | ICD-10-CM | POA: Diagnosis not present

## 2022-06-19 DIAGNOSIS — D122 Benign neoplasm of ascending colon: Secondary | ICD-10-CM | POA: Insufficient documentation

## 2022-06-19 DIAGNOSIS — E890 Postprocedural hypothyroidism: Secondary | ICD-10-CM | POA: Diagnosis not present

## 2022-06-19 DIAGNOSIS — K5730 Diverticulosis of large intestine without perforation or abscess without bleeding: Secondary | ICD-10-CM | POA: Diagnosis not present

## 2022-06-19 DIAGNOSIS — D124 Benign neoplasm of descending colon: Secondary | ICD-10-CM | POA: Insufficient documentation

## 2022-06-19 DIAGNOSIS — D120 Benign neoplasm of cecum: Secondary | ICD-10-CM | POA: Diagnosis not present

## 2022-06-19 DIAGNOSIS — K6289 Other specified diseases of anus and rectum: Secondary | ICD-10-CM | POA: Diagnosis not present

## 2022-06-19 DIAGNOSIS — E039 Hypothyroidism, unspecified: Secondary | ICD-10-CM | POA: Diagnosis not present

## 2022-06-19 DIAGNOSIS — E1151 Type 2 diabetes mellitus with diabetic peripheral angiopathy without gangrene: Secondary | ICD-10-CM | POA: Insufficient documentation

## 2022-06-19 DIAGNOSIS — Z86010 Personal history of colonic polyps: Secondary | ICD-10-CM | POA: Diagnosis not present

## 2022-06-19 HISTORY — DX: Hyperlipidemia, unspecified: E78.5

## 2022-06-19 HISTORY — PX: COLONOSCOPY: SHX5424

## 2022-06-19 LAB — GLUCOSE, CAPILLARY: Glucose-Capillary: 135 mg/dL — ABNORMAL HIGH (ref 70–99)

## 2022-06-19 SURGERY — COLONOSCOPY
Anesthesia: General

## 2022-06-19 MED ORDER — SODIUM CHLORIDE 0.9 % IV SOLN: INTRAVENOUS | Status: DC

## 2022-06-19 MED ORDER — LIDOCAINE HCL (CARDIAC) PF 100 MG/5ML IV SOSY
PREFILLED_SYRINGE | INTRAVENOUS | Status: DC | PRN
  Administered 2022-06-19: 50 mg via INTRAVENOUS

## 2022-06-19 MED ORDER — PROPOFOL 500 MG/50ML IV EMUL
INTRAVENOUS | Status: DC | PRN
  Administered 2022-06-19: 150 ug/kg/min via INTRAVENOUS

## 2022-06-19 MED ORDER — PROPOFOL 10 MG/ML IV BOLUS
INTRAVENOUS | Status: DC | PRN
  Administered 2022-06-19: 50 mg via INTRAVENOUS

## 2022-06-19 NOTE — Anesthesia Procedure Notes (Signed)
Procedure Name: MAC Date/Time: 06/19/2022 9:05 AM  Performed by: Jerrye Noble, CRNAPre-anesthesia Checklist: Patient identified, Emergency Drugs available, Suction available and Patient being monitored Patient Re-evaluated:Patient Re-evaluated prior to induction Oxygen Delivery Method: Nasal cannula

## 2022-06-19 NOTE — Op Note (Signed)
[facility] Gastroenterology Patient Name: Wendy Barnes Procedure Date: 06/19/2022 8:39 AM MRN: [id number] Account #: [id number] Date of Birth: 1941/01/09 Admit Type: Outpatient Age: 81 Room: [facility] ENDO ROOM 1 Gender: Female Note Status: Finalized Instrument Name: Colonscope [id number] Procedure:             Colonoscopy Indications:           High risk colon cancer surveillance: Personal history                         of colonic polyps Providers:             Rueben Bash, DO Referring MD:          Jaci Standard. Rollene Rotunda (Referring MD) Medicines:             Monitored Anesthesia Care Complications:         No immediate complications. Estimated blood loss:                         Minimal. Procedure:             Pre-Anesthesia Assessment:                        - Prior to the procedure, a History and Physical was                         performed, and patient medications and allergies were                         reviewed. The patient is competent. The risks and                         benefits of the procedure and the sedation options and                         risks were discussed with the patient. All questions                         were answered and informed consent was obtained.                         Patient identification and proposed procedure were                         verified by the physician, the nurse, the anesthetist                         and the technician in the endoscopy suite. Mental                         Status Examination: alert and oriented. Airway                         Examination: normal oropharyngeal airway and neck                         mobility. Respiratory Examination: clear to  auscultation. CV Examination: RRR, no murmurs, no S3                         or S4. Prophylactic Antibiotics: The patient does not                         require prophylactic antibiotics. Prior                          Anticoagulants: The patient has taken no previous                         anticoagulant or antiplatelet agents. ASA Grade                         Assessment: III - A patient with severe systemic                         disease. After reviewing the risks and benefits, the                         patient was deemed in satisfactory condition to                         undergo the procedure. The anesthesia plan was to use                         monitored anesthesia care (MAC). Immediately prior to                         administration of medications, the patient was                         re-assessed for adequacy to receive sedatives. The                         heart rate, respiratory rate, oxygen saturations,                         blood pressure, adequacy of pulmonary ventilation, and                         response to care were monitored throughout the                         procedure. The physical status of the patient was                         re-assessed after the procedure.                        After obtaining informed consent, the colonoscope was                         passed under direct vision. Throughout the procedure,                         the patient's blood pressure, pulse, and oxygen  saturations were monitored continuously. The                         Colonoscope was introduced through the anus and                         advanced to the the terminal ileum, with                         identification of the appendiceal orifice and IC                         valve. The colonoscopy was performed without                         difficulty. The patient tolerated the procedure well.                         The quality of the bowel preparation was evaluated                         using the BBPS [facility] Bowel Preparation Scale) with                         scores of: Right Colon = 2 (minor amount of residual                         staining, small  fragments of stool and/or opaque                         liquid, but mucosa seen well), Transverse Colon = 3                         (entire mucosa seen well with no residual staining,                         small fragments of stool or opaque liquid) and Left                         Colon = 2 (minor amount of residual staining, small                         fragments of stool and/or opaque liquid, but mucosa                         seen well). The total BBPS score equals 7. The quality                         of the bowel preparation was good. Findings:      The terminal ileum appeared normal. Estimated blood loss: none.      Non-bleeding internal hemorrhoids were found during retroflexion. The       hemorrhoids were Grade I (internal hemorrhoids that do not prolapse).       Estimated blood loss: none.      The perianal and digital rectal examinations were normal. Pertinent       negatives include normal sphincter tone.  Anal papilla(e) were hypertrophied. Estimated blood loss: none.      A 3 to 4 mm polyp was found in the ascending colon. The polyp was       pedunculated. The polyp was removed with a hot snare. Resection and       retrieval were complete. Estimated blood loss was minimal.      A 2 to 3 mm polyp was found in the ascending colon. The polyp was       sessile. The polyp was removed with a cold snare. Resection and       retrieval were complete. Estimated blood loss was minimal.      Two sessile polyps were found in the cecum. The polyps were 1 to 2 mm in       size. These polyps were removed with a jumbo cold forceps. Resection and       retrieval were complete. Estimated blood loss was minimal.      Two sessile polyps were found in the descending colon and transverse       colon. The polyps were 1 to 3 mm in size. These polyps were removed with       a jumbo cold forceps. Resection and retrieval were complete. Estimated       blood loss was minimal.      A 3 to 4 mm  polyp was found in the transverse colon. The polyp was       sessile. The polyp was removed with a cold snare. Resection and       retrieval were complete. Estimated blood loss was minimal.      Multiple small and large-mouthed diverticula were found in the entire       colon. More diverticuli on the left than right- specifically sigmoid.       Estimated blood loss: none.      There was moderate spasm in the recto-sigmoid colon, in the sigmoid       colon and in the descending colon. Estimated blood loss: none.      The exam was otherwise without abnormality on direct and retroflexion       views. Impression:            - The examined portion of the ileum was normal.                        - Non-bleeding internal hemorrhoids.                        - Anal papilla(e) were hypertrophied.                        - One 3 to 4 mm polyp in the ascending colon, removed                         with a hot snare. Resected and retrieved.                        - One 2 to 3 mm polyp in the ascending colon, removed                         with a cold snare. Resected and retrieved.                        -  Two 1 to 2 mm polyps in the cecum, removed with a                         jumbo cold forceps. Resected and retrieved.                        - Two 1 to 3 mm polyps in the descending colon and in                         the transverse colon, removed with a jumbo cold                         forceps. Resected and retrieved.                        - One 3 to 4 mm polyp in the transverse colon, removed                         with a cold snare. Resected and retrieved.                        - Diverticulosis in the entire examined colon.                        - Moderate colonic spasm.                        - The examination was otherwise normal on direct and                         retroflexion views. Recommendation:        - Discharge patient to home.                        - Resume previous diet.                         - Continue present medications.                        - Await pathology results.                        - No aspirin, ibuprofen, naproxen, or other                         non-steroidal anti-inflammatory drugs for 5 days after                         polyp removal.                        - Repeat colonoscopy for surveillance based on                         pathology results.                        - Return to referring physician as previously  scheduled.                        - The findings and recommendations were discussed with                         the patient. 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 All rights reserved. The codes documented in this report are preliminary and upon coder review may  be revised to meet current compliance requirements. Attending Participation:      I personally performed the entire procedure. Volney American, DO Annamaria Helling DO, DO 06/19/2022 10:11:20 AM This report has been signed electronically. Number of Addenda: 0 Note Initiated On: 06/19/2022 8:39 AM Scope Withdrawal Time: 0  hours 31 minutes 34 seconds  Total Procedure Duration: 0 hours 47 minutes 9 seconds  Estimated Blood Loss:  Estimated blood loss was minimal.      [facility]

## 2022-06-19 NOTE — Interval H&P Note (Signed)
History and Physical Interval Note: Preprocedure H&P from 06/19/22  was reviewed and there was no interval change after seeing and examining the patient.  Written consent was obtained from the patient after discussion of risks, benefits, and alternatives. Patient has consented to proceed with Colonoscopy with possible intervention   06/19/2022 8:58 AM  Wendy Barnes  has presented today for surgery, with the diagnosis of History of colon polyps (Z86.010).  The various methods of treatment have been discussed with the patient and family. After consideration of risks, benefits and other options for treatment, the patient has consented to  Procedure(s) with comments: COLONOSCOPY (N/A) - DM as a surgical intervention.  The patient's history has been reviewed, patient examined, no change in status, stable for surgery.  I have reviewed the patient's chart and labs.  Questions were answered to the patient's satisfaction.     Annamaria Helling

## 2022-06-19 NOTE — Anesthesia Preprocedure Evaluation (Signed)
Anesthesia Evaluation  Patient identified by MRN, date of birth, ID band Patient awake    Reviewed: Allergy & Precautions, H&P , NPO status , Patient's Chart, lab work & pertinent test results, reviewed documented beta blocker date and time   History of Anesthesia Complications Negative for: history of anesthetic complications  Airway Mallampati: I  TM Distance: >3 FB Neck ROM: full    Dental  (+) Edentulous Upper, Upper Dentures, Dental Advidsory Given   Pulmonary neg pulmonary ROS, former smoker,    Pulmonary exam normal breath sounds clear to auscultation       Cardiovascular Exercise Tolerance: Good hypertension, On Medications (-) angina+ Peripheral Vascular Disease  (-) CAD, (-) Past MI, (-) Cardiac Stents and (-) CABG Normal cardiovascular exam(-) dysrhythmias (-) Valvular Problems/Murmurs Rhythm:regular Rate:Normal     Neuro/Psych PSYCHIATRIC DISORDERS Anxiety negative neurological ROS     GI/Hepatic negative GI ROS, Neg liver ROS,   Endo/Other  diabetesHypothyroidism   Renal/GU negative Renal ROS  negative genitourinary   Musculoskeletal   Abdominal   Peds  Hematology negative hematology ROS (+)   Anesthesia Other Findings Past Medical History:   Hypertension                                                 Thyroid disease                                              Hypothyroidism                                               Varicella                                                    Reproductive/Obstetrics negative OB ROS                             Anesthesia Physical  Anesthesia Plan  ASA: 3  Anesthesia Plan: General   Post-op Pain Management:    Induction: Intravenous  PONV Risk Score and Plan: 3 and Propofol infusion and TIVA  Airway Management Planned: Natural Airway and Nasal Cannula  Additional Equipment:   Intra-op Plan:   Post-operative Plan:    Informed Consent: I have reviewed the patients History and Physical, chart, labs and discussed the procedure including the risks, benefits and alternatives for the proposed anesthesia with the patient or authorized representative who has indicated his/her understanding and acceptance.     Dental Advisory Given  Plan Discussed with: Anesthesiologist, CRNA and Surgeon  Anesthesia Plan Comments:         Anesthesia Quick Evaluation

## 2022-06-19 NOTE — Transfer of Care (Signed)
Immediate Anesthesia Transfer of Care Note  Patient: Wendy Barnes  Procedure(s) Performed: COLONOSCOPY  Patient Location: PACU and Endoscopy Unit  Anesthesia Type:General  Level of Consciousness: awake, drowsy and patient cooperative  Airway & Oxygen Therapy: Patient Spontanous Breathing  Post-op Assessment: Report given to RN and Post -op Vital signs reviewed and stable  Post vital signs: Reviewed and stable  Last Vitals:  Vitals Value Taken Time  BP 101/50 06/19/22 1002  Temp 36.1 C 06/19/22 1002  Pulse 69 06/19/22 1004  Resp 16 06/19/22 1004  SpO2 100 % 06/19/22 1004  Vitals shown include unvalidated device data.  Last Pain:  Vitals:   06/19/22 1002  TempSrc: Temporal  PainSc: 0-No pain         Complications: No notable events documented.

## 2022-06-20 ENCOUNTER — Encounter: Payer: Self-pay | Admitting: Gastroenterology

## 2022-06-20 LAB — SURGICAL PATHOLOGY

## 2022-06-22 NOTE — Anesthesia Postprocedure Evaluation (Signed)
Anesthesia Post Note  Patient: Wendy Barnes  Procedure(s) Performed: COLONOSCOPY  Patient location during evaluation: Endoscopy Anesthesia Type: General Level of consciousness: awake and alert Pain management: pain level controlled Vital Signs Assessment: post-procedure vital signs reviewed and stable Respiratory status: spontaneous breathing, nonlabored ventilation, respiratory function stable and patient connected to nasal cannula oxygen Cardiovascular status: blood pressure returned to baseline and stable Postop Assessment: no apparent nausea or vomiting Anesthetic complications: no   No notable events documented.   Last Vitals:  Vitals:   06/19/22 1012 06/19/22 1022  BP: (!) 129/46   Pulse:    Resp:    Temp:    SpO2:  100%    Last Pain:  Vitals:   06/19/22 1032  TempSrc:   PainSc: 0-No pain                 Martha Clan

## 2022-06-23 ENCOUNTER — Ambulatory Visit (INDEPENDENT_AMBULATORY_CARE_PROVIDER_SITE_OTHER): Payer: Medicare Other | Admitting: Dermatology

## 2022-06-23 DIAGNOSIS — L2089 Other atopic dermatitis: Secondary | ICD-10-CM | POA: Diagnosis not present

## 2022-06-23 DIAGNOSIS — L820 Inflamed seborrheic keratosis: Secondary | ICD-10-CM | POA: Diagnosis not present

## 2022-06-23 DIAGNOSIS — D1800 Hemangioma unspecified site: Secondary | ICD-10-CM

## 2022-06-23 DIAGNOSIS — L578 Other skin changes due to chronic exposure to nonionizing radiation: Secondary | ICD-10-CM

## 2022-06-23 DIAGNOSIS — Z1283 Encounter for screening for malignant neoplasm of skin: Secondary | ICD-10-CM | POA: Diagnosis not present

## 2022-06-23 DIAGNOSIS — L814 Other melanin hyperpigmentation: Secondary | ICD-10-CM | POA: Diagnosis not present

## 2022-06-23 DIAGNOSIS — Z85828 Personal history of other malignant neoplasm of skin: Secondary | ICD-10-CM

## 2022-06-23 DIAGNOSIS — D229 Melanocytic nevi, unspecified: Secondary | ICD-10-CM

## 2022-06-23 DIAGNOSIS — L821 Other seborrheic keratosis: Secondary | ICD-10-CM | POA: Diagnosis not present

## 2022-06-23 DIAGNOSIS — I8393 Asymptomatic varicose veins of bilateral lower extremities: Secondary | ICD-10-CM

## 2022-06-23 DIAGNOSIS — L280 Lichen simplex chronicus: Secondary | ICD-10-CM

## 2022-06-23 MED ORDER — MOMETASONE FUROATE 0.1 % EX CREA: TOPICAL_CREAM | CUTANEOUS | 0 refills | Status: DC

## 2022-06-23 NOTE — Progress Notes (Signed)
Follow-Up Visit   Subjective  Wendy Barnes is a 81 y.o. female who presents for the following: Annual Exam (Hx SCC - patient has noticed irregular skin lesions on the L forehead, L cheek, and chest that she would like checked today.). The patient presents for Total-Body Skin Exam (TBSE) for skin cancer screening and mole check.  The patient has spots, moles and lesions to be evaluated, some may be new or changing.  The following portions of the chart were reviewed this encounter and updated as appropriate:   Tobacco  Allergies  Meds  Problems  Med Hx  Surg Hx  Fam Hx     Review of Systems:  No other skin or systemic complaints except as noted in HPI or Assessment and Plan.  Objective  Well appearing patient in no apparent distress; mood and affect are within normal limits.  A full examination was performed including scalp, head, eyes, ears, nose, lips, neck, chest, axillae, abdomen, back, buttocks, bilateral upper extremities, bilateral lower extremities, hands, feet, fingers, toes, fingernails, and toenails. All findings within normal limits unless otherwise noted below.  Chest Excoriations.  Above the L med brow x 1, L inf cheek x 1 (2) Erythematous stuck-on, waxy papule or plaque   Assessment & Plan  Other atopic dermatitis Face  Atopic dermatitis (eczema) is a chronic, relapsing, pruritic condition that can significantly affect quality of life. It is often associated with allergic rhinitis and/or asthma and can require treatment with topical medications, phototherapy, or in severe cases biologic injectable medication (Dupixent; Adbry) or Oral JAK inhibitors.  Start Mometasone 0.1% cream QD-BID to aa's face PRN. Topical steroids (such as triamcinolone, fluocinolone, fluocinonide, mometasone, clobetasol, halobetasol, betamethasone, hydrocortisone) can cause thinning and lightening of the skin if they are used for too long in the same area. Your physician has selected the  right strength medicine for your problem and area affected on the body. Please use your medication only as directed by your physician to prevent side effects.   mometasone (ELOCON) 0.1 % cream - Face Apply to aa's face and chest QD-BID x 2 weeks. Then drop to 5d/qk PRN.  Lichen simplex chronicus Chest Start Mometasone 0.1% cream to aa's QD-BID PRN. Topical steroids (such as triamcinolone, fluocinolone, fluocinonide, mometasone, clobetasol, halobetasol, betamethasone, hydrocortisone) can cause thinning and lightening of the skin if they are used for too long in the same area. Your physician has selected the right strength medicine for your problem and area affected on the body. Please use your medication only as directed by your physician to prevent side effects.   Inflamed seborrheic keratosis (2) Above the L med brow x 1, L inf cheek x 1 Destruction of lesion - Above the L med brow x 1, L inf cheek x 1 Complexity: simple   Destruction method: cryotherapy   Informed consent: discussed and consent obtained   Timeout:  patient name, date of birth, surgical site, and procedure verified Lesion destroyed using liquid nitrogen: Yes   Region frozen until ice ball extended beyond lesion: Yes   Outcome: patient tolerated procedure well with no complications   Post-procedure details: wound care instructions given    Lentigines - Scattered tan macules - Due to sun exposure - Benign-appearing, observe - Recommend daily broad spectrum sunscreen SPF 30+ to sun-exposed areas, reapply every 2 hours as needed. - Call for any changes  Seborrheic Keratoses - Stuck-on, waxy, tan-brown papules and/or plaques  - Benign-appearing - Discussed benign etiology and prognosis. - Observe -  Call for any changes  Melanocytic Nevi - Tan-brown and/or pink-flesh-colored symmetric macules and papules - Benign appearing on exam today - Observation - Call clinic for new or changing moles - Recommend daily use of  broad spectrum spf 30+ sunscreen to sun-exposed areas.   Hemangiomas - Red papules - Discussed benign nature - Observe - Call for any changes  Actinic Damage - Chronic condition, secondary to cumulative UV/sun exposure - diffuse scaly erythematous macules with underlying dyspigmentation - Recommend daily broad spectrum sunscreen SPF 30+ to sun-exposed areas, reapply every 2 hours as needed.  - Staying in the shade or wearing long sleeves, sun glasses (UVA+UVB protection) and wide brim hats (4-inch brim around the entire circumference of the hat) are also recommended for sun protection.  - Call for new or changing lesions.  Varicose Veins/Spider Veins - Dilated blue, purple or red veins at the lower extremities - Reassured - Smaller vessels can be treated by sclerotherapy (a procedure to inject a medicine into the veins to make them disappear) if desired, but the treatment is not covered by insurance. Larger vessels may be covered if symptomatic and we would refer to vascular surgeon if treatment desired.  Skin cancer screening performed today.  Return in about 1 year (around 06/24/2023) for TBSE.  Luther Redo, CMA, am acting as scribe for Sarina Ser, MD . Documentation: I have reviewed the above documentation for accuracy and completeness, and I agree with the above.  Sarina Ser, MD

## 2022-06-23 NOTE — Patient Instructions (Signed)
Due to recent changes in healthcare laws, you may see results of your pathology and/or laboratory studies on MyChart before the doctors have had a chance to review them. We understand that in some cases there may be results that are confusing or concerning to you. Please understand that not all results are received at the same time and often the doctors may need to interpret multiple results in order to provide you with the best plan of care or course of treatment. Therefore, we ask that you please give us 2 business days to thoroughly review all your results before contacting the office for clarification. Should we see a critical lab result, you will be contacted sooner.   If You Need Anything After Your Visit  If you have any questions or concerns for your doctor, please call our main line at [phone] and press option 4 to reach your doctor's medical assistant. If no one answers, please leave a voicemail as directed and we will return your call as soon as possible. Messages left after 4 pm will be answered the following business day.   You may also send us a message via MyChart. We typically respond to MyChart messages within 1-2 business days.  For prescription refills, please ask your pharmacy to contact our office. Our fax number is [phone].  If you have an urgent issue when the clinic is closed that cannot wait until the next business day, you can page your doctor at the number below.    Please note that while we do our best to be available for urgent issues outside of office hours, we are not available 24/7.   If you have an urgent issue and are unable to reach us, you may choose to seek medical care at your doctor's office, retail clinic, urgent care center, or emergency room.  If you have a medical emergency, please immediately call 911 or go to the emergency department.  Pager Numbers  - Dr. Kowalski: [phone]  - Dr. Moye: [phone]  - Dr. Stewart:  [phone]  In the event of inclement weather, please call our main line at [phone] for an update on the status of any delays or closures.  Dermatology Medication Tips: Please keep the boxes that topical medications come in in order to help keep track of the instructions about where and how to use these. Pharmacies typically print the medication instructions only on the boxes and not directly on the medication tubes.   If your medication is too expensive, please contact our office at [phone] option 4 or send us a message through MyChart.   We are unable to tell what your co-pay for medications will be in advance as this is different depending on your insurance coverage. However, we may be able to find a substitute medication at lower cost or fill out paperwork to get insurance to cover a needed medication.   If a prior authorization is required to get your medication covered by your insurance company, please allow us 1-2 business days to complete this process.  Drug prices often vary depending on where the prescription is filled and some pharmacies may offer cheaper prices.  The website www.goodrx.com contains coupons for medications through different pharmacies. The prices here do not account for what the cost may be with help from insurance (it may be cheaper with your insurance), but the website can give you the price if you did not use any insurance.  - You can print the associated coupon and take it with   your prescription to the pharmacy.  - You may also stop by our office during regular business hours and pick up a GoodRx coupon card.  - If you need your prescription sent electronically to a different pharmacy, notify our office through [facility] MyChart or by phone at [phone] option 4.     Si Usted Necesita Algo Despus de Su Visita  Tambin puede enviarnos un mensaje a travs de MyChart. Por lo general respondemos a los mensajes de MyChart en el transcurso de 1 a 2  das hbiles.  Para renovar recetas, por favor pida a su farmacia que se ponga en contacto con nuestra oficina. Nuestro nmero de fax es el [phone].  Si tiene un asunto urgente cuando la clnica est cerrada y que no puede esperar hasta el siguiente da hbil, puede llamar/localizar a su doctor(a) al nmero que aparece a continuacin.   Por favor, tenga en cuenta que aunque hacemos todo lo posible para estar disponibles para asuntos urgentes fuera del horario de oficina, no estamos disponibles las 24 horas del da, los 7 das de la semana.   Si tiene un problema urgente y no puede comunicarse con nosotros, puede optar por buscar atencin mdica  en el consultorio de su doctor(a), en una clnica privada, en un centro de atencin urgente o en una sala de emergencias.  Si tiene una emergencia mdica, por favor llame inmediatamente al 911 o vaya a la sala de emergencias.  Nmeros de bper  - Dr. Kowalski: [phone]  - Dra. Moye: [phone]  - Dra. Stewart: [phone]  En caso de inclemencias del tiempo, por favor llame a nuestra lnea principal al [phone] para una actualizacin sobre el estado de cualquier retraso o cierre.  Consejos para la medicacin en dermatologa: Por favor, guarde las cajas en las que vienen los medicamentos de uso tpico para ayudarle a seguir las instrucciones sobre dnde y cmo usarlos. Las farmacias generalmente imprimen las instrucciones del medicamento slo en las cajas y no directamente en los tubos del medicamento.   Si su medicamento es muy caro, por favor, pngase en contacto con nuestra oficina llamando al [phone] y presione la opcin 4 o envenos un mensaje a travs de MyChart.   No podemos decirle cul ser su copago por los medicamentos por adelantado ya que esto es diferente dependiendo de la cobertura de su seguro. Sin embargo, es posible que podamos encontrar un medicamento sustituto a menor costo o llenar un formulario para que el  seguro cubra el medicamento que se considera necesario.   Si se requiere una autorizacin previa para que su compaa de seguros cubra su medicamento, por favor permtanos de 1 a 2 das hbiles para completar este proceso.  Los precios de los medicamentos varan con frecuencia dependiendo del lugar de dnde se surte la receta y alguna farmacias pueden ofrecer precios ms baratos.  El sitio web www.goodrx.com tiene cupones para medicamentos de diferentes farmacias. Los precios aqu no tienen en cuenta lo que podra costar con la ayuda del seguro (puede ser ms barato con su seguro), pero el sitio web puede darle el precio si no utiliz ningn seguro.  - Puede imprimir el cupn correspondiente y llevarlo con su receta a la farmacia.  - Tambin puede pasar por nuestra oficina durante el horario de atencin regular y recoger una tarjeta de cupones de GoodRx.  - Si necesita que su receta se enve electrnicamente a una farmacia diferente, informe a nuestra oficina a travs de MyChart de [place]   o por telfono llamando al [phone] y presione la opcin 4.  

## 2022-06-24 ENCOUNTER — Other Ambulatory Visit: Payer: Self-pay | Admitting: Family Medicine

## 2022-06-24 DIAGNOSIS — E034 Atrophy of thyroid (acquired): Secondary | ICD-10-CM

## 2022-06-24 DIAGNOSIS — M1712 Unilateral primary osteoarthritis, left knee: Secondary | ICD-10-CM | POA: Diagnosis not present

## 2022-06-24 DIAGNOSIS — Z96651 Presence of right artificial knee joint: Secondary | ICD-10-CM | POA: Diagnosis not present

## 2022-06-25 NOTE — Telephone Encounter (Signed)
Requested Prescriptions  Pending Prescriptions Disp Refills  . levothyroxine (SYNTHROID) 88 MCG tablet [Pharmacy Med Name: LEVOTHYROXINE SODIUM 88 MCG TAB] 90 tablet 0    Sig: TAKE 1 TABLET EVERY DAY ON EMPTY [place] A GLASS OF WATER AT LEAST 30-60 [place]     Endocrinology:  Hypothyroid Agents Passed - 06/24/2022  5:03 PM      Passed - TSH in normal range and within 360 days    TSH  Date Value Ref Range Status  01/14/2022 1.960 0.450 - 4.500 uIU/mL Final         Passed - Valid encounter within last 12 months    Recent Outpatient Visits          5 months ago Blood blister   [facility] Tally Joe T, FNP   10 months ago Acute cystitis without hematuria   [facility] Birdie Sons, MD   11 months ago Medicare annual wellness visit, subsequent   [place], Dionne Bucy, MD   1 year ago Type 2 diabetes mellitus with diabetic polyneuropathy, without long-term current use of insulin [facility])   [facility] Jerrol Banana., MD   1 year ago Primary hypertension   [place], Clearnce Sorrel, [place]      Future Appointments            In 3 weeks Gwyneth Sprout, [place], [place]   In 8 months Debroah Loop, PA-C [facility]   In 1 year Ralene Bathe, MD [place]

## 2022-06-26 ENCOUNTER — Other Ambulatory Visit: Payer: Self-pay | Admitting: Family Medicine

## 2022-06-26 DIAGNOSIS — E7800 Pure hypercholesterolemia, unspecified: Secondary | ICD-10-CM

## 2022-07-01 ENCOUNTER — Encounter: Payer: Self-pay | Admitting: Dermatology

## 2022-07-01 ENCOUNTER — Other Ambulatory Visit: Payer: Self-pay | Admitting: Family Medicine

## 2022-07-01 DIAGNOSIS — Z1231 Encounter for screening mammogram for malignant neoplasm of breast: Secondary | ICD-10-CM

## 2022-07-05 ENCOUNTER — Other Ambulatory Visit: Payer: Self-pay | Admitting: Family Medicine

## 2022-07-05 DIAGNOSIS — E7800 Pure hypercholesterolemia, unspecified: Secondary | ICD-10-CM

## 2022-07-09 ENCOUNTER — Other Ambulatory Visit: Payer: Self-pay | Admitting: Family Medicine

## 2022-07-14 ENCOUNTER — Telehealth: Payer: Self-pay | Admitting: Physician Assistant

## 2022-07-14 NOTE — Telephone Encounter (Signed)
Pt called and has one week left of Gemtesa '75mg'$  and is requesting more samples. Please call her [phone] as to when she can come by to pick them up.

## 2022-07-15 NOTE — Telephone Encounter (Signed)
Patient informed, samples left up front.  Voiced understanding.

## 2022-07-18 NOTE — Progress Notes (Signed)
Established patient visit   Patient: Wendy Barnes   DOB: 02-10-1941   81 y.o. Female  MRN: [id number] Visit Date: 07/21/2022  Today's healthcare provider: Gwyneth Sprout, FNP  Introduced to nurse practitioner role and practice setting.  All questions answered.  Discussed provider/patient relationship and expectations.   I,Tiffany J Bragg,acting as a scribe for Gwyneth Sprout, FNP.,have documented all relevant documentation on the behalf of Gwyneth Sprout, FNP,as directed by  Gwyneth Sprout, FNP while in the presence of Gwyneth Sprout, FNP.   Chief Complaint  Patient presents with   Hypertension   Diabetes   Subjective    HPI  Hypertension, follow-up  BP Readings from Last 3 Encounters:  07/21/22 136/64  06/19/22 (!) 129/46  02/04/22 (!) 154/79   Wt Readings from Last 3 Encounters:  07/21/22 217 lb (98.4 kg)  06/19/22 210 lb (95.3 kg)  02/04/22 211 lb (95.7 kg)     She was last seen for hypertension 6 months ago.  BP at that visit was 141/59. Management since that visit includes no changes.  She reports excellent compliance with treatment. She is not having side effects.  She is following a Regular diet. She is exercising. She does not smoke.  Use of agents associated with hypertension: none.   Outside blood pressures are not checked. Symptoms: No chest pain No chest pressure  No palpitations No syncope  No dyspnea No orthopnea  No paroxysmal nocturnal dyspnea Yes lower extremity edema   Pertinent labs Lab Results  Component Value Date   CHOL 162 01/14/2022   HDL 64 01/14/2022   LDLCALC 69 01/14/2022   TRIG 177 (H) 01/14/2022   CHOLHDL 2.5 01/14/2022   Lab Results  Component Value Date   NA 141 01/14/2022   K 4.2 01/14/2022   CREATININE 1.04 (H) 01/14/2022   EGFR 55 (L) 01/14/2022   GLUCOSE 108 (H) 01/14/2022   TSH 1.960 01/14/2022     The 10-year ASCVD risk score (Arnett DK, et al., 2019) is:  52.6%  ---------------------------------------------------------------------------------------------------  Diabetes Mellitus Type II, Follow-up  Lab Results  Component Value Date   HGBA1C 5.7 (A) 07/21/2022   HGBA1C 6.4 (H) 01/14/2022   HGBA1C 6.1 (H) 05/21/2021   Wt Readings from Last 3 Encounters:  07/21/22 217 lb (98.4 kg)  06/19/22 210 lb (95.3 kg)  02/04/22 211 lb (95.7 kg)   Last seen for diabetes 6 months ago.  Management since then includes no changes. She reports excellent compliance with treatment. She is not having side effects.  Symptoms: No fatigue No foot ulcerations  No appetite changes No nausea  Yes paresthesia of the feet  No polydipsia  No polyuria No visual disturbances   No vomiting     Home blood sugar records:  not checked  Episodes of hypoglycemia? No    Current insulin regiment: none Most Recent Eye Exam: utd Current exercise: walking Current diet habits: in general, an "unhealthy" diet  Pertinent Labs: Lab Results  Component Value Date   CHOL 162 01/14/2022   HDL 64 01/14/2022   LDLCALC 69 01/14/2022   TRIG 177 (H) 01/14/2022   CHOLHDL 2.5 01/14/2022   Lab Results  Component Value Date   NA 141 01/14/2022   K 4.2 01/14/2022   CREATININE 1.04 (H) 01/14/2022   EGFR 55 (L) 01/14/2022   MICROALBUR 20 09/28/2017   LABMICR See below: 03/05/2022     ---------------------------------------------------------------------------------------------------  Medications: Outpatient Medications Prior to Visit  Medication Sig   Biotin 2500 MCG CAPS Take 2,500 mcg by mouth daily.   Calcium Carbonate-Vitamin D 600-10 MG-MCG TABS Take by mouth.   Coenzyme Q10 (CO Q10) 100 MG CAPS Take by mouth.   Coenzyme Q10 100 MG capsule Take by mouth.   Cranberry-Vitamin C-Vitamin E (CRANBERRY PLUS VITAMIN C) 4200-20-3 MG-MG-UNIT CAPS Take 1 capsule by mouth daily.   levothyroxine (SYNTHROID) 88 MCG tablet TAKE 1 TABLET EVERY DAY ON EMPTY STOMACHWITH A GLASS  OF WATER AT LEAST 30-60 MINBEFORE BREAKFAST   metFORMIN (GLUCOPHAGE) 500 MG tablet TAKE ONE TABLET (500 MG) BY MOUTH EVERY MORNING WITH BREAKFAST   mometasone (ELOCON) 0.1 % cream Apply to aa's face and chest QD-BID x 2 weeks. Then drop to 5d/qk PRN.   Multiple Vitamin (MULTIVITAMIN) tablet Take 1 tablet by mouth daily.   Omega-3 Fatty Acids (FISH OIL ULTRA) 1400 MG CAPS Take 1,400 mg by mouth daily.   oxybutynin (DITROPAN-XL) 10 MG 24 hr tablet Take 1 tablet (10 mg total) by mouth at bedtime.   rosuvastatin (CRESTOR) 5 MG tablet TAKE 1 TABLET BY MOUTH DAILY   traZODone (DESYREL) 50 MG tablet TAKE ONE-HALF TO ONE TABLET BY MOUTH AT BEDTIME AS NEEDED FOR SLEEP   triamcinolone cream (KENALOG) 0.1 % Apply 1 application topically as directed. BID for 2 weeks then decrease to qd 5 times per week. Avoid face, groin, underarms.   Vibegron (GEMTESA) 75 MG TABS Take 75 mg by mouth daily.   [DISCONTINUED] ALPRAZolam (XANAX) 0.5 MG tablet TAKE 0.5 TABLET BY MOUTH EVERY NIGHT AT BEDTIME AS NEEDED FOR SLEEP   [DISCONTINUED] ALPRAZolam (XANAX) 0.5 MG tablet Take by mouth.   [DISCONTINUED] amLODipine (NORVASC) 5 MG tablet TAKE 1 TABLET BY MOUTH ONCE DAILY   [DISCONTINUED] benazepril-hydrochlorthiazide (LOTENSIN HCT) 20-12.5 MG tablet TAKE ONE (1) TABLET BY MOUTH TWO TIMES PER DAY   [DISCONTINUED] celecoxib (CELEBREX) 100 MG capsule Take 100 mg by mouth 2 (two) times daily.   No facility-administered medications prior to visit.    Review of Systems  Last CBC Lab Results  Component Value Date   WBC 9.8 05/21/2021   HGB 13.4 05/21/2021   HCT 40.4 05/21/2021   MCV 86 05/21/2021   MCH 28.4 05/21/2021   RDW 13.2 05/21/2021   PLT 248 59/56/3875   Last metabolic panel Lab Results  Component Value Date   GLUCOSE 108 (H) 01/14/2022   NA 141 01/14/2022   K 4.2 01/14/2022   CL 103 01/14/2022   CO2 21 01/14/2022   BUN 18 01/14/2022   CREATININE 1.04 (H) 01/14/2022   EGFR 55 (L) 01/14/2022   CALCIUM  10.0 01/14/2022   PROT 6.8 01/14/2022   ALBUMIN 4.4 01/14/2022   LABGLOB 2.4 01/14/2022   AGRATIO 1.8 01/14/2022   BILITOT 0.4 01/14/2022   ALKPHOS 95 01/14/2022   AST 25 01/14/2022   ALT 21 01/14/2022   ANIONGAP 6 (L) 02/22/2014   Last lipids Lab Results  Component Value Date   CHOL 162 01/14/2022   HDL 64 01/14/2022   LDLCALC 69 01/14/2022   TRIG 177 (H) 01/14/2022   CHOLHDL 2.5 01/14/2022   Last hemoglobin A1c Lab Results  Component Value Date   HGBA1C 5.7 (A) 07/21/2022   Last thyroid functions Lab Results  Component Value Date   TSH 1.960 01/14/2022       Objective    BP 136/64 (BP Location: Right Arm, Patient Position: Sitting, Cuff Size: Normal)   Pulse (!) 56   Temp 98.3  F (36.8 C) (Oral)   Ht '5\' 5"'  (1.651 m)   Wt 217 lb (98.4 kg)   BMI 36.11 kg/m   BP Readings from Last 3 Encounters:  07/21/22 136/64  06/19/22 (!) 129/46  02/04/22 (!) 154/79   Wt Readings from Last 3 Encounters:  07/21/22 217 lb (98.4 kg)  06/19/22 210 lb (95.3 kg)  02/04/22 211 lb (95.7 kg)   SpO2 Readings from Last 3 Encounters:  06/19/22 100%  01/14/22 100%  08/27/21 98%      Physical Exam Vitals and nursing note reviewed.  Constitutional:      General: She is not in acute distress.    Appearance: Normal appearance. She is obese. She is not ill-appearing, toxic-appearing or diaphoretic.  HENT:     Head: Normocephalic and atraumatic.  Cardiovascular:     Rate and Rhythm: Normal rate and regular rhythm.     Pulses: Normal pulses.     Heart sounds: Normal heart sounds. No murmur heard.    No friction rub. No gallop.  Pulmonary:     Effort: Pulmonary effort is normal. No respiratory distress.     Breath sounds: Normal breath sounds. No stridor. No wheezing, rhonchi or rales.  Chest:     Chest wall: No tenderness.  Musculoskeletal:        General: No swelling, tenderness, deformity or signs of injury. Normal range of motion.     Right lower leg: No edema.     Left  lower leg: No edema.  Skin:    General: Skin is warm and dry.     Capillary Refill: Capillary refill takes less than 2 seconds.     Coloration: Skin is not jaundiced or pale.     Findings: No bruising, erythema, lesion or rash.  Neurological:     General: No focal deficit present.     Mental Status: She is alert and oriented to person, place, and time. Mental status is at baseline.     Cranial Nerves: No cranial nerve deficit.     Sensory: No sensory deficit.     Motor: No weakness.     Coordination: Coordination normal.  Psychiatric:        Mood and Affect: Mood normal.        Behavior: Behavior normal.        Thought Content: Thought content normal.        Judgment: Judgment normal.     Results for orders placed or performed in visit on 07/21/22  POCT glycosylated hemoglobin (Hb A1C)  Result Value Ref Range   Hemoglobin A1C 5.7 (A) 4.0 - 5.6 %   Est. average glucose Bld gHb Est-mCnc 117     Assessment & Plan     Problem List Items Addressed This Visit       Cardiovascular and Mediastinum   Hypertension associated with diabetes ([place])    Chronic, stable; goal of <130/<80 Denies CP Denies SOB/ DOE Denies low blood pressure/hypotension Denies vision changes No LE Edema noted on exam Continue medication, Lotensin HCT 20-12.5 mg BID, Norvasc 5 mg QD Denies side effects Declines repeat CMP Seek emergent care if you develop chest pain or chest pressure       Relevant Medications   amLODipine (NORVASC) 5 MG tablet   benazepril-hydrochlorthiazide (LOTENSIN HCT) 20-12.5 MG tablet     Endocrine   Type 2 diabetes mellitus with diabetic polyneuropathy, without long-term current use of insulin (HCC) - Primary    Chronic, improved A1c now 5.7%  On ACEi/On Statin UTD on Eye exam Continue to recommend balanced, lower carb meals. Smaller meal size, adding snacks. Choosing water as drink of choice and increasing purposeful exercise.       Relevant Medications    benazepril-hydrochlorthiazide (LOTENSIN HCT) 20-12.5 MG tablet   Other Relevant Orders   POCT glycosylated hemoglobin (Hb A1C) (Completed)     Other   Mixed incontinence    Chronic, improved F/b urology Continue Gemtesa 75 mg and Oxybutynin 10 mg qHS      Morbid obesity (HCC)    Chronic, stable Associated with HTN, HLD, DM Body mass index is 36.11 kg/m. Discussed importance of healthy weight management Discussed diet and exercise         Return in about 6 months (around 01/21/2023) for annual examination.      Vonna Kotyk, FNP, have reviewed all documentation for this visit. The documentation on 07/21/22 for the exam, diagnosis, procedures, and orders are all accurate and complete.    Gwyneth Sprout, [place] [phone] (phone) [phone] (fax)  [place]

## 2022-07-21 ENCOUNTER — Encounter: Payer: Self-pay | Admitting: Family Medicine

## 2022-07-21 ENCOUNTER — Ambulatory Visit (INDEPENDENT_AMBULATORY_CARE_PROVIDER_SITE_OTHER): Payer: Medicare Other | Admitting: Family Medicine

## 2022-07-21 VITALS — BP 136/64 | HR 56 | Temp 98.3°F | Ht 65.0 in | Wt 217.0 lb

## 2022-07-21 DIAGNOSIS — E1142 Type 2 diabetes mellitus with diabetic polyneuropathy: Secondary | ICD-10-CM | POA: Diagnosis not present

## 2022-07-21 DIAGNOSIS — I152 Hypertension secondary to endocrine disorders: Secondary | ICD-10-CM

## 2022-07-21 DIAGNOSIS — N3946 Mixed incontinence: Secondary | ICD-10-CM

## 2022-07-21 DIAGNOSIS — E1159 Type 2 diabetes mellitus with other circulatory complications: Secondary | ICD-10-CM | POA: Diagnosis not present

## 2022-07-21 LAB — POCT GLYCOSYLATED HEMOGLOBIN (HGB A1C)
Est. average glucose Bld gHb Est-mCnc: 117
Hemoglobin A1C: 5.7 % — AB (ref 4.0–5.6)

## 2022-07-21 MED ORDER — BENAZEPRIL-HYDROCHLOROTHIAZIDE 20-12.5 MG PO TABS
1.0000 | ORAL_TABLET | Freq: Two times a day (BID) | ORAL | 3 refills | Status: DC
Start: 2022-07-21 — End: 2023-04-29

## 2022-07-21 MED ORDER — AMLODIPINE BESYLATE 5 MG PO TABS: 5.0000 mg | ORAL_TABLET | Freq: Every day | ORAL | 3 refills | Status: DC

## 2022-07-21 NOTE — Assessment & Plan Note (Signed)
Chronic, stable; goal of <130/<80 Denies CP Denies SOB/ DOE Denies low blood pressure/hypotension Denies vision changes No LE Edema noted on exam Continue medication, Lotensin HCT 20-12.5 mg BID, Norvasc 5 mg QD Denies side effects Declines repeat CMP Seek emergent care if you develop chest pain or chest pressure

## 2022-07-21 NOTE — Assessment & Plan Note (Signed)
Chronic, improved F/b urology Continue Gemtesa 75 mg and Oxybutynin 10 mg qHS

## 2022-07-21 NOTE — Patient Instructions (Signed)
The CDC recommends two doses of Shingrix (the new shingles vaccine) separated by 2 to 6 months for adults age 79 years and older. I recommend checking with your insurance plan regarding coverage for this vaccine.    

## 2022-07-21 NOTE — Assessment & Plan Note (Signed)
Chronic, stable Associated with HTN, HLD, DM Body mass index is 36.11 kg/m. Discussed importance of healthy weight management Discussed diet and exercise

## 2022-07-21 NOTE — Assessment & Plan Note (Signed)
Chronic, improved A1c now 5.7% On ACEi/On Statin UTD on Eye exam Continue to recommend balanced, lower carb meals. Smaller meal size, adding snacks. Choosing water as drink of choice and increasing purposeful exercise.

## 2022-07-25 DIAGNOSIS — H43813 Vitreous degeneration, bilateral: Secondary | ICD-10-CM | POA: Diagnosis not present

## 2022-07-25 DIAGNOSIS — Z23 Encounter for immunization: Secondary | ICD-10-CM | POA: Diagnosis not present

## 2022-07-25 LAB — HM DIABETES EYE EXAM

## 2022-07-31 ENCOUNTER — Ambulatory Visit: Payer: Self-pay

## 2022-07-31 NOTE — Chronic Care Management (AMB) (Signed)
   07/31/2022  Wendy Barnes [birth year] [id number]  Documentation encounter created to complete case transition. The care management team will continue to follow for care coordination as needed.   [facility] [phone]

## 2022-08-01 ENCOUNTER — Ambulatory Visit
Admission: RE | Admit: 2022-08-01 | Discharge: 2022-08-01 | Disposition: A | Discharge status: Home / Self Care | Payer: Medicare Other | Source: Ambulatory Visit | Attending: Family Medicine | Admitting: Family Medicine

## 2022-08-01 DIAGNOSIS — Z1231 Encounter for screening mammogram for malignant neoplasm of breast: Secondary | ICD-10-CM | POA: Insufficient documentation

## 2022-08-04 NOTE — Progress Notes (Signed)
[place]  Normal mammogram; repeat in 1 year.  Please let us know if you have any questions.  Thank you,  Tally Joe, FNP

## 2022-08-05 ENCOUNTER — Ambulatory Visit (INDEPENDENT_AMBULATORY_CARE_PROVIDER_SITE_OTHER): Payer: Medicare Other

## 2022-08-05 VITALS — Wt 217.0 lb

## 2022-08-05 DIAGNOSIS — Z0000 Encounter for general adult medical examination without abnormal findings: Secondary | ICD-10-CM

## 2022-08-05 NOTE — Progress Notes (Signed)
Virtual Visit via Telephone Note  I connected with  Wendy Barnes on 08/05/22 at  9:00 AM EDT by telephone and verified that I am speaking with the correct person using two identifiers.  Location: Patient: home Provider: [facility] Persons participating in the virtual visit: [place]   I discussed the limitations, risks, security and privacy concerns of performing an evaluation and management service by telephone and the availability of in person appointments. The patient expressed understanding and agreed to proceed.  Interactive audio and video telecommunications were attempted between this nurse and patient, however failed, due to patient having technical difficulties OR patient did not have access to video capability.  We continued and completed visit with audio only.  Some vital signs may be absent or patient reported.   Dionisio David, LPN  Subjective:   Wendy Barnes is a 81 y.o. female who presents for Medicare Annual (Subsequent) preventive examination.  Review of Systems     Cardiac Risk Factors include: advanced age (>72mn, >>32women);diabetes mellitus;dyslipidemia;hypertension     Objective:    There were no vitals filed for this visit. There is no height or weight on file to calculate BMI.     08/05/2022    9:07 AM 06/19/2022    8:49 AM 04/19/2018    9:24 AM 12/22/2017    8:30 AM 04/17/2017    9:03 AM 04/24/2016    8:02 AM 04/16/2016   10:02 AM  Advanced Directives  Does Patient Have a Medical Advance Directive? No Yes No No No No No  Type of Advance Directive  Living will       Does patient want to make changes to medical advance directive?     Yes (ED - Information included in AVS)    Would patient like information on creating a medical advance directive? No - Patient declined  No - Patient declined No - Patient declined  No - patient declined information     Current Medications (verified) Outpatient Encounter Medications as of 08/05/2022  Medication  Sig   amLODipine (NORVASC) 5 MG tablet Take 1 tablet (5 mg total) by mouth daily.   benazepril-hydrochlorthiazide (LOTENSIN HCT) 20-12.5 MG tablet Take 1 tablet by mouth 2 (two) times daily.   Biotin 2500 MCG CAPS Take 2,500 mcg by mouth daily.   Calcium Carbonate-Vitamin D 600-10 MG-MCG TABS Take by mouth.   Coenzyme Q10 100 MG capsule Take by mouth.   Cranberry-Vitamin C-Vitamin E (CRANBERRY PLUS VITAMIN C) 4200-20-3 MG-MG-UNIT CAPS Take 1 capsule by mouth daily.   levothyroxine (SYNTHROID) 88 MCG tablet TAKE 1 TABLET EVERY DAY ON EMPTY STOMACHWITH A GLASS OF WATER AT LEAST 30-60 MINBEFORE BREAKFAST   metFORMIN (GLUCOPHAGE) 500 MG tablet TAKE ONE TABLET (500 MG) BY MOUTH EVERY MORNING WITH BREAKFAST   mometasone (ELOCON) 0.1 % cream Apply to aa's face and chest QD-BID x 2 weeks. Then drop to 5d/qk PRN.   Omega-3 Fatty Acids (FISH OIL ULTRA) 1400 MG CAPS Take 1,400 mg by mouth daily.   oxybutynin (DITROPAN-XL) 10 MG 24 hr tablet Take 1 tablet (10 mg total) by mouth at bedtime.   rosuvastatin (CRESTOR) 5 MG tablet TAKE 1 TABLET BY MOUTH DAILY   traZODone (DESYREL) 50 MG tablet TAKE ONE-HALF TO ONE TABLET BY MOUTH AT BEDTIME AS NEEDED FOR SLEEP   triamcinolone cream (KENALOG) 0.1 % Apply 1 application topically as directed. BID for 2 weeks then decrease to qd 5 times per week. Avoid face, groin, underarms.   Vibegron ([facility]  75 MG TABS Take 75 mg by mouth daily.   Multiple Vitamin (MULTIVITAMIN) tablet Take 1 tablet by mouth daily. (Patient not taking: Reported on 08/05/2022)   [DISCONTINUED] Coenzyme Q10 (CO Q10) 100 MG CAPS Take by mouth.   No facility-administered encounter medications on file as of 08/05/2022.    Allergies (verified) Amoxicillin, Lovastatin, Penicillins, and Pravastatin   History: Past Medical History:  Diagnosis Date   Anxiety    Cancer (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   left temple   T2DM (type 2 diabetes mellitus) ([place])    Thyroid disease    Varicella    Vertigo    Past Surgical History:  Procedure Laterality Date   CATARACT EXTRACTION W/PHACO Left 11/17/2017   Procedure: CATARACT EXTRACTION PHACO AND INTRAOCULAR LENS PLACEMENT (IOC)-LEFT DIABETIC;  Surgeon: Birder Robson, MD;  Location: [facility] ORS;  Service: Ophthalmology;  Laterality: Left;  [place] 00:36 AP% 15.1 CDE 5.46 Fluid pack lot # [id number]   CATARACT EXTRACTION W/PHACO Right 12/22/2017   Procedure: CATARACT EXTRACTION PHACO AND INTRAOCULAR LENS PLACEMENT (IOC);  Surgeon: Birder Robson, MD;  Location: [facility] ORS;  Service: Ophthalmology;  Laterality: Right;  [place] 00:46.1 AP% 12.9 CDE 5.97 Fluid pack lot # [id number]   COLONOSCOPY N/A 06/19/2022   Procedure: COLONOSCOPY;  Surgeon: Annamaria Helling, DO;  Location: [facility] ENDOSCOPY;  Service: Gastroenterology;  Laterality: N/A;  DM   COLONOSCOPY WITH PROPOFOL N/A 04/24/2016   Procedure: COLONOSCOPY WITH PROPOFOL;  Surgeon: Manya Silvas, MD;  Location: [facility] ENDOSCOPY;  Service: Endoscopy;  Laterality: N/A;   DILATION AND CURETTAGE OF UTERUS  2002   JOINT REPLACEMENT Right    REPLACEMENT TOTAL KNEE Right 02/20/2014   THYROIDECTOMY  2010   TONSILLECTOMY     TONSILLECTOMY AND ADENOIDECTOMY  1950   TOTAL KNEE ARTHROPLASTY  2015   right knee   Family History  Problem Relation Age of Onset   Coronary artery disease Mother    Heart attack Mother    Coronary artery disease Father    Stroke Father    Breast cancer Neg Hx    Social History   Socioeconomic History   Marital status: Widowed    Spouse name: Not on file   Number of children: 4   Years of education: Not on file   Highest education level: 12th grade  Occupational History   Occupation: retired  Tobacco Use   Smoking status: Former    Packs/day: 1.00    Years: 25.00    Total pack years: 25.00    Types: Cigarettes    Quit date: 12/15/1995    Years since  quitting: 26.6   Smokeless tobacco: Never   Tobacco comments:    quit in 1990's  Vaping Use   Vaping Use: Never used  Substance and Sexual Activity   Alcohol use: Yes    Alcohol/week: 0.0 standard drinks of alcohol    Comment: occasionally - glass of wine   Drug use: No   Sexual activity: Not on file  Other Topics Concern   Not on file  Social History Narrative   Not on file   Social Determinants of Health   Financial Resource Strain: Low Risk  (08/05/2022)   Overall Financial Resource Strain (CARDIA)    Difficulty of Paying Living Expenses: Not hard at all  Food Insecurity: No Food Insecurity (08/05/2022)   Hunger  Vital Sign    Worried About Charity fundraiser in the Last Year: Never true    Ran Out of Food in the Last Year: Never true  Transportation Needs: No Transportation Needs (08/05/2022)   PRAPARE - Hydrologist (Medical): No    Lack of Transportation (Non-Medical): No  Physical Activity: Insufficiently Active (08/05/2022)   Exercise Vital Sign    Days of Exercise per Week: 3 days    Minutes of Exercise per Session: 30 min  Stress: No Stress Concern Present (08/05/2022)   [place]    Feeling of Stress : Not at all  Social Connections: Moderately Integrated (08/05/2022)   Social Connection and Isolation Panel [NHANES]    Frequency of Communication with Friends and Family: More than three times a week    Frequency of Social Gatherings with Friends and Family: Twice a week    Attends Religious Services: More than 4 times per year    Active Member of [employer] or Organizations: Yes    Attends Archivist Meetings: More than 4 times per year    Marital Status: Widowed    Tobacco Counseling Counseling given: Not Answered Tobacco comments: quit in 1990's   Clinical Intake:  Pre-visit preparation completed: Yes  Pain : No/denies pain     Nutritional Risks:  None Diabetes: Yes CBG done?: No Did pt. bring in CBG monitor from home?: No  How often do you need to have someone help you when you read instructions, pamphlets, or other written materials from your doctor or pharmacy?: 1 - Never  Diabetic?yes Nutrition Risk Assessment:  Has the patient had any N/V/D within the last 2 months?  No  Does the patient have any non-healing wounds?  No  Has the patient had any unintentional weight loss or weight gain?  No   Diabetes:  Is the patient diabetic?  Yes  If diabetic, was a CBG obtained today?  No  Did the patient bring in their glucometer from home?  No  How often do you monitor your CBG's? never.   Financial Strains and Diabetes Management:  Are you having any financial strains with the device, your supplies or your medication? No .  Does the patient want to be seen by Chronic Care Management for management of their diabetes?  No  Would the patient like to be referred to a Nutritionist or for Diabetic Management?  No   Diabetic Exams:  Diabetic Eye Exam: Completed 07/24/21. Overdue for diabetic eye exam. Pt has been advised about the importance in completing this exam.  Diabetic Foot Exam: Completed 07/08/21. Pt has been advised about the importance in completing this exam.    Interpreter Needed?: No  Information entered by :: Kirke Shaggy, LPN   Activities of Daily Living    08/05/2022    9:08 AM 07/21/2022    9:17 AM  In your present state of health, do you have any difficulty performing the following activities:  Hearing? 0 0  Vision? 0 0  Difficulty concentrating or making decisions? 0 0  Walking or climbing stairs? 0 0  Dressing or bathing? 0 0  Doing errands, shopping? 0 0  Preparing Food and eating ? N   Using the Toilet? N   In the past six months, have you accidently leaked urine? N   Do you have problems with loss of bowel control? N   Managing your Medications? N  Managing your Finances? N   Housekeeping or  managing your Housekeeping? N     Patient Care Team: Gwyneth Sprout, FNP as PCP - General (Family Medicine) Birder Robson, MD as Referring Physician (Ophthalmology) Ralene Bathe, MD as Consulting Physician (Dermatology)  Indicate any recent Medical Services you may have received from other than [facility] providers in the past year (date may be approximate).     Assessment:   This is a routine wellness examination for Wendy Barnes.  Hearing/Vision screen Hearing Screening - Comments:: No aids Vision Screening - Comments:: Wears glasses-  Eye   Dietary issues and exercise activities discussed: Current Exercise Habits: Home exercise routine, Type of exercise: walking, Time (Minutes): 30, Frequency (Times/Week): 3, Weekly Exercise (Minutes/Week): 90, Intensity: Mild   Goals Addressed             This Visit's Progress    DIET - EAT MORE FRUITS AND VEGETABLES         Depression Screen    07/21/2022    9:17 AM 07/08/2021    2:32 PM 05/21/2021    1:23 PM 01/02/2021   10:55 AM 06/28/2020    9:10 AM 06/10/2019    3:36 PM 05/26/2019   12:10 PM  PHQ 2/9 Scores  PHQ - 2 Score 0 0 0 0 0 0 0  PHQ- 9 Score 0 0 0 0 0 0 0    Fall Risk    08/05/2022    9:08 AM 07/21/2022    9:17 AM 07/08/2021    2:32 PM 05/21/2021    1:22 PM 01/02/2021   10:55 AM  [place] in the past year? 0 0 0 0 0  Number falls in past yr: 0 0 0 0 0  Injury with Fall? 0 0 0 0 0  Risk for fall due to : No Fall Risks    No Fall Risks  Follow up Falls evaluation completed   Falls evaluation completed Falls evaluation completed    [place]:  Any stairs in or around the home? No  If so, are there any without handrails? No  Home free of loose throw rugs in walkways, pet beds, electrical cords, etc? Yes  Adequate lighting in your home to reduce risk of falls? Yes   ASSISTIVE DEVICES UTILIZED TO PREVENT FALLS:  Life alert? No  Use of a cane, walker or w/c? No  Grab bars  in the bathroom? Yes  Shower chair or bench in shower? No  Elevated toilet seat or a handicapped toilet? No   Cognitive Function:        08/05/2022    9:10 AM 04/17/2017    9:09 AM  6CIT Screen  What Year? 0 points 0 points  What month? 0 points 0 points  What time? 0 points 0 points  Count back from 20 0 points 0 points  Months in reverse 0 points 0 points  Repeat phrase 2 points 4 points  Total Score 2 points 4 points    Immunizations Immunization History  Administered Date(s) Administered   Fluad Quad(high Dose 65+) 08/23/2019, 08/29/2020   Influenza, High Dose Seasonal PF 10/16/2015, 08/20/2016, 09/28/2017, 10/16/2018   Influenza-Unspecified 09/13/2021   PFIZER(Purple Top)SARS-COV-2 Vaccination 01/16/2020, 02/06/2020, 09/17/2020   Pneumococcal Conjugate-13 10/30/2014   Pneumococcal Polysaccharide-23 01/02/2010   Zoster, Live 02/08/2009    TDAP status: Due, Education has been provided regarding the importance of this vaccine. Advised may receive this vaccine at local pharmacy  or Health Dept. Aware to provide a copy of the vaccination record if obtained from local pharmacy or Health Dept. Verbalized acceptance and understanding.  Flu Vaccine status: Up to date  Pneumococcal vaccine status: Up to date  Covid-19 vaccine status: Completed vaccines  Qualifies for Shingles Vaccine? Yes   Zostavax completed Yes   Shingrix Completed?: No.    Education has been provided regarding the importance of this vaccine. Patient has been advised to call insurance company to determine out of pocket expense if they have not yet received this vaccine. Advised may also receive vaccine at local pharmacy or Health Dept. Verbalized acceptance and understanding.  Screening Tests Health Maintenance  Topic Date Due   Zoster Vaccines- Shingrix (1 of 2) Never done   COVID-19 Vaccine (4 - Pfizer risk series) 11/12/2020   FOOT EXAM  07/08/2022   INFLUENZA VACCINE  07/15/2022   OPHTHALMOLOGY EXAM   07/24/2022   TETANUS/TDAP  01/14/2023 (Originally 09/03/1960)   HEMOGLOBIN A1C  01/21/2023   Pneumonia Vaccine 28+ Years old  Completed   DEXA SCAN  Completed   Hepatitis C Screening  Completed   HPV VACCINES  Aged Out   COLONOSCOPY (Pts 45-15yr Insurance coverage will need to be confirmed)  [place]  Topic Date Due   Zoster Vaccines- Shingrix (1 of 2) Never done   COVID-19 Vaccine (4 - Pfizer risk series) 11/12/2020   FOOT EXAM  07/08/2022   INFLUENZA VACCINE  07/15/2022   OPHTHALMOLOGY EXAM  07/24/2022    Colorectal cancer screening: Type of screening: Colonoscopy. Completed 06/19/22. Repeat every 3 years- aged out  Mammogram status: Completed 08/01/22. Repeat every year  Bone Density status: Completed 05/14/16. Results reflect: Bone density results: NORMAL. Repeat every 5 years.- declined referral  Lung Cancer Screening: (Low Dose CT Chest recommended if Age 79-80years, 30 pack-year currently smoking OR have quit w/in 15years.) does not qualify.   Additional Screening:  Hepatitis C Screening: does qualify; Completed 07/03/20  Vision Screening: Recommended annual ophthalmology exams for early detection of glaucoma and other disorders of the eye. Is the patient up to date with their annual eye exam?  Yes  Who is the provider or what is the name of the office in which the patient attends annual [place] exams[place] not established with a provider, would they like to be referred to a provider to establish care? No .   Dental Screening: Recommended annual dental exams for proper oral hygiene  Community Resource Referral / Chronic Care Management: CRR required this visit?  No   CCM required this visit?  No      Plan:     I have personally reviewed and noted the following in the patient's chart:   Medical and social history Use of alcohol, tobacco or illicit drugs  Current medications and supplements including  opioid prescriptions. Patient is not currently taking opioid prescriptions. Functional ability and status Nutritional status Physical activity Advanced directives List of other physicians Hospitalizations, surgeries, and ER visits in previous 12 months Vitals Screenings to include cognitive, depression, and falls Referrals and appointments  In addition, I have reviewed and discussed with patient certain preventive protocols, quality metrics, and best practice recommendations. A written personalized care plan for preventive services as well as general preventive health recommendations were provided to patient.     LDionisio David LPN   [id number]  Nurse Notes: none

## 2022-08-05 NOTE — Patient Instructions (Signed)
Wendy Barnes , Thank you for taking time to come for your Medicare Wellness Visit. I appreciate your ongoing commitment to your health goals. Please review the following plan we discussed and let me know if I can assist you in the future.   Screening recommendations/referrals: Colonoscopy: 06/19/22 Mammogram: 08/01/22 Bone Density: 05/14/16 Recommended yearly ophthalmology/optometry visit for glaucoma screening and checkup Recommended yearly dental visit for hygiene and checkup  Vaccinations: Influenza vaccine: 09/13/21 Pneumococcal vaccine: 10/30/14 Tdap vaccine: n/d Shingles vaccine: Zostavax 02/08/09   Covid-19:01/16/20, 02/06/20, 09/17/20, 07/25/22  Advanced directives: no  Conditions/risks identified: none  Next appointment: Follow up in one year for your annual wellness visit 08/10/23 @ 9am by phone   Preventive Care 65 Years and Older, Female Preventive care refers to lifestyle choices and visits with your health care provider that can promote health and wellness. What does preventive care include? A yearly physical exam. This is also called an annual well check. Dental exams once or twice a year. Routine eye exams. Ask your health care provider how often you should have your eyes checked. Personal lifestyle choices, including: Daily care of your teeth and gums. Regular physical activity. Eating a healthy diet. Avoiding tobacco and drug use. Limiting alcohol use. Practicing safe sex. Taking low-dose aspirin every day. Taking vitamin and mineral supplements as recommended by your health care provider. What happens during an annual well check? The services and screenings done by your health care provider during your annual well check will depend on your age, overall health, lifestyle risk factors, and family history of disease. Counseling  Your health care provider may ask you questions about your: Alcohol use. Tobacco use. Drug use. Emotional well-being. Home and  relationship well-being. Sexual activity. Eating habits. History of falls. Memory and ability to understand (cognition). Work and work Statistician. Reproductive health. Screening  You may have the following tests or measurements: Height, weight, and BMI. Blood pressure. Lipid and cholesterol levels. These may be checked every 5 years, or more frequently if you are over 40 years old. Skin check. Lung cancer screening. You may have this screening every year starting at age 81 if you have a 30-pack-year history of smoking and currently smoke or have quit within the past 15 years. Fecal occult blood test (FOBT) of the stool. You may have this test every year starting at age 79. Flexible sigmoidoscopy or colonoscopy. You may have a sigmoidoscopy every 5 years or a colonoscopy every 10 years starting at age 42. Hepatitis C blood test. Hepatitis B blood test. Sexually transmitted disease (STD) testing. Diabetes screening. This is done by checking your blood sugar (glucose) after you have not eaten for a while (fasting). You may have this done every 1-3 years. Bone density scan. This is done to screen for osteoporosis. You may have this done starting at age 20. Mammogram. This may be done every 1-2 years. Talk to your health care provider about how often you should have regular mammograms. Talk with your health care provider about your test results, treatment options, and if necessary, the need for more tests. Vaccines  Your health care provider may recommend certain vaccines, such as: Influenza vaccine. This is recommended every year. Tetanus, diphtheria, and acellular pertussis (Tdap, Td) vaccine. You may need a Td booster every 10 years. Zoster vaccine. You may need this after age 60. Pneumococcal 13-valent conjugate (PCV13) vaccine. One dose is recommended after age 88. Pneumococcal polysaccharide (PPSV23) vaccine. One dose is recommended after age 2. Talk to your health  care provider  about which screenings and vaccines you need and how often you need them. This information is not intended to replace advice given to you by your health care provider. Make sure you discuss any questions you have with your health care provider. Document Released: 12/28/2015 Document Revised: 08/20/2016 Document Reviewed: 10/02/2015 Elsevier Interactive Patient Education  2017 [place] Prevention in the Home Falls can cause injuries. They can happen to people of all ages. There are many things you can do to make your home safe and to help prevent falls. What can I do on the outside of my home? Regularly fix the edges of walkways and driveways and fix any cracks. Remove anything that might make you trip as you walk through a door, such as a raised step or threshold. Trim any bushes or trees on the path to your home. Use bright outdoor lighting. Clear any walking paths of anything that might make someone trip, such as rocks or tools. Regularly check to see if handrails are loose or broken. Make sure that both sides of any steps have handrails. Any raised decks and porches should have guardrails on the edges. Have any leaves, snow, or ice cleared regularly. Use sand or salt on walking paths during winter. Clean up any spills in your garage right away. This includes oil or grease spills. What can I do in the bathroom? Use night lights. Install grab bars by the toilet and in the tub and shower. Do not use towel bars as grab bars. Use non-skid mats or decals in the tub or shower. If you need to sit down in the shower, use a plastic, non-slip stool. Keep the floor dry. Clean up any water that spills on the floor as soon as it happens. Remove soap buildup in the tub or shower regularly. Attach bath mats securely with double-sided non-slip rug tape. Do not have throw rugs and other things on the floor that can make you trip. What can I do in the bedroom? Use night lights. Make sure  that you have a light by your bed that is easy to reach. Do not use any sheets or blankets that are too big for your bed. They should not hang down onto the floor. Have a firm chair that has side arms. You can use this for support while you get dressed. Do not have throw rugs and other things on the floor that can make you trip. What can I do in the kitchen? Clean up any spills right away. Avoid walking on wet floors. Keep items that you use a lot in easy-to-reach places. If you need to reach something above you, use a strong step stool that has a grab bar. Keep electrical cords out of the way. Do not use floor polish or wax that makes floors slippery. If you must use wax, use non-skid floor wax. Do not have throw rugs and other things on the floor that can make you trip. What can I do with my stairs? Do not leave any items on the stairs. Make sure that there are handrails on both sides of the stairs and use them. Fix handrails that are broken or loose. Make sure that handrails are as long as the stairways. Check any carpeting to make sure that it is firmly attached to the stairs. Fix any carpet that is loose or worn. Avoid having throw rugs at the top or bottom of the stairs. If you do have throw rugs, attach them to  the floor with carpet tape. Make sure that you have a light switch at the top of the stairs and the bottom of the stairs. If you do not have them, ask someone to add them for you. What else can I do to help prevent falls? Wear shoes that: Do not have high heels. Have rubber bottoms. Are comfortable and fit you well. Are closed at the toe. Do not wear sandals. If you use a stepladder: Make sure that it is fully opened. Do not climb a closed stepladder. Make sure that both sides of the stepladder are locked into place. Ask someone to hold it for you, if possible. Clearly mark and make sure that you can see: Any grab bars or handrails. First and last steps. Where the edge of  each step is. Use tools that help you move around (mobility aids) if they are needed. These include: Canes. Walkers. Scooters. Crutches. Turn on the lights when you go into a dark area. Replace any light bulbs as soon as they burn out. Set up your furniture so you have a clear path. Avoid moving your furniture around. If any of your floors are uneven, fix them. If there are any pets around you, be aware of where they are. Review your medicines with your doctor. Some medicines can make you feel dizzy. This can increase your chance of falling. Ask your doctor what other things that you can do to help prevent falls. This information is not intended to replace advice given to you by your health care provider. Make sure you discuss any questions you have with your health care provider. Document Released: 09/27/2009 Document Revised: 05/08/2016 Document Reviewed: 01/05/2015 Elsevier Interactive Patient Education  2017 [employer].

## 2022-09-23 ENCOUNTER — Other Ambulatory Visit: Payer: Self-pay | Admitting: Family Medicine

## 2022-09-23 DIAGNOSIS — N3281 Overactive bladder: Secondary | ICD-10-CM

## 2022-09-24 ENCOUNTER — Other Ambulatory Visit: Payer: Self-pay | Admitting: Family Medicine

## 2022-09-24 DIAGNOSIS — E034 Atrophy of thyroid (acquired): Secondary | ICD-10-CM

## 2022-09-24 DIAGNOSIS — E7800 Pure hypercholesterolemia, unspecified: Secondary | ICD-10-CM

## 2022-09-30 DIAGNOSIS — Z23 Encounter for immunization: Secondary | ICD-10-CM | POA: Diagnosis not present

## 2022-12-09 ENCOUNTER — Other Ambulatory Visit: Payer: Self-pay | Admitting: Family Medicine

## 2022-12-09 DIAGNOSIS — E1142 Type 2 diabetes mellitus with diabetic polyneuropathy: Secondary | ICD-10-CM

## 2022-12-17 ENCOUNTER — Other Ambulatory Visit: Payer: Self-pay | Admitting: Family Medicine

## 2022-12-17 DIAGNOSIS — E7800 Pure hypercholesterolemia, unspecified: Secondary | ICD-10-CM

## 2022-12-17 DIAGNOSIS — N3281 Overactive bladder: Secondary | ICD-10-CM

## 2022-12-22 ENCOUNTER — Ambulatory Visit (INDEPENDENT_AMBULATORY_CARE_PROVIDER_SITE_OTHER): Payer: Medicare Other | Admitting: Physician Assistant

## 2022-12-22 VITALS — BP 169/76 | HR 64 | Ht 65.5 in

## 2022-12-22 DIAGNOSIS — N3946 Mixed incontinence: Secondary | ICD-10-CM

## 2022-12-22 LAB — URINALYSIS, COMPLETE
Bilirubin, UA: NEGATIVE
Glucose, UA: NEGATIVE
Ketones, UA: NEGATIVE
Nitrite, UA: NEGATIVE
Protein,UA: NEGATIVE
RBC, UA: NEGATIVE
Specific Gravity, UA: 1.02 (ref 1.005–1.030)
Urobilinogen, Ur: 0.2 mg/dL (ref 0.2–1.0)
pH, UA: 6.5 (ref 5.0–7.5)

## 2022-12-22 LAB — MICROSCOPIC EXAMINATION: Epithelial Cells (non renal): >10 /hpf — AB (ref 0–10)

## 2022-12-22 NOTE — Progress Notes (Signed)
12/22/2022 10:10 AM   [name] [birth year] [id number]  CC: Chief Complaint  Patient presents with   Urinary Incontinence    Follow up   HPI: Wendy Barnes is a 82 y.o. 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   Today she reports her symptoms remain very well-controlled on her OAB regimen.  She has been on oxybutynin for many years and wonders if she should consider stopping it.  She has noticed some mild occasional forgetfulness that she attributes to age and denies constipation, dry mouth, and dry eye.  She continues to experience remarkable symptomatic improvement on Gemtesa.  She has been using samples, as unfortunately it is cost prohibitive for her at $600 per month with her insurance.  She notes she wears 0-1 pads daily for security, where previously she was using 4 to 5/day.  She still has occasional mixed urge and stress incontinence episodes.  Notably, no cardiac pacemaker.  In-office UA today positive for trace leukocytes; urine microscopy with 11-30 WBCs/HPF, >10 epithelial cells/hpf, and many bacteria.  PMH: Past Medical History:  Diagnosis Date   Anxiety    Cancer ([place])    skin   Hyperchloremia    Hyperlipidemia    Hypertension    Hypothyroidism    Squamous cell carcinoma of skin 09/18/2008   left temple   T2DM (type 2 diabetes mellitus) ([place])    Thyroid disease    Varicella    Vertigo     Surgical History: Past Surgical History:  Procedure Laterality Date   CATARACT EXTRACTION W/PHACO Left 11/17/2017   Procedure: CATARACT EXTRACTION PHACO AND INTRAOCULAR LENS PLACEMENT (IOC)-LEFT DIABETIC;  Surgeon: Birder Robson, MD;  Location: [facility] ORS;  Service: Ophthalmology;  Laterality: Left;  [place] 00:36 AP% 15.1 CDE 5.46 Fluid pack lot # [id number]   CATARACT EXTRACTION W/PHACO Right 12/22/2017   Procedure: CATARACT EXTRACTION PHACO AND INTRAOCULAR LENS PLACEMENT (IOC);   Surgeon: Birder Robson, MD;  Location: [facility] ORS;  Service: Ophthalmology;  Laterality: Right;  [place] 00:46.1 AP% 12.9 CDE 5.97 Fluid pack lot # [id number]   COLONOSCOPY N/A 06/19/2022   Procedure: COLONOSCOPY;  Surgeon: Annamaria Helling, DO;  Location: [facility] END[facility]COPY;  Service: Gastroenterology;  Laterality: N/A;  DM   COLONOSCOPY WITH PROPOFOL N/A 04/24/2016   Procedure: COLONOSCOPY WITH PROPOFOL;  Surgeon: Manya Silvas, MD;  Location: [facility] ENDOSCOPY;  Service: Endoscopy;  Laterality: N/A;   DILATION AND CURETTAGE OF UTERUS  2002   JOINT REPLACEMENT Right    REPLACEMENT TOTAL KNEE Right 02/20/2014   THYROIDECTOMY  2010   TONSILLECTOMY     TONSILLECTOMY AND ADENOIDECTOMY  1950   TOTAL KNEE ARTHROPLASTY  2015   right knee    Home Medications:  Allergies as of 12/22/2022       Reactions   Amoxicillin Hives, Itching, Rash   Has patient had a PCN reaction causing immediate rash, facial/tongue/throat swelling, SOB or lightheadedness with hypotension: Yes Has patient had a PCN reaction causing severe rash involving mucus membranes or skin necrosis: Yes Has patient had a PCN reaction that required hospitalization: No Has patient had a PCN reaction occurring within the last 10 years: Yes If all of the above answers are "NO", then may proceed with Cephalosporin use.   Lovastatin Other (See Comments)   myalgias   Penicillins Hives, Rash   Pravastatin Other (See Comments)   Myalgias        Medication List  Accurate as of December 22, 2022 10:10 AM. If you have any questions, ask your nurse or doctor.          amLODipine 5 MG tablet Commonly known as: NORVASC Take 1 tablet (5 mg total) by mouth daily.   benazepril-hydrochlorthiazide 20-12.5 MG tablet Commonly known as: LOTENSIN HCT Take 1 tablet by mouth 2 (two) times daily.   Biotin 2500 MCG Caps Take 2,500 mcg by mouth daily.   Calcium Carbonate-Vitamin D 600-10 MG-MCG Tabs Take by mouth.   Coenzyme Q10 100  MG capsule Take by mouth.   Cranberry Plus Vitamin C 4200-20-3 MG-MG-UNIT Caps Generic drug: Cranberry-Vitamin C-Vitamin E Take 1 capsule by mouth daily.   Fish Oil Ultra 1400 MG Caps Take 1,400 mg by mouth daily.   Gemtesa 75 MG Tabs Generic drug: Vibegron Take 75 mg by mouth daily.   levothyroxine 88 MCG tablet Commonly known as: SYNTHROID TAKE 1 TABLET EVERY DAY ON EMPTY STOMACHWITH A GLASS OF WATER AT LEAST 30-60 MINBEFORE BREAKFAST   metFORMIN 500 MG tablet Commonly known as: GLUCOPHAGE TAKE ONE TABLET (500 MG) BY MOUTH EVERY MORNING WITH BREAKFAST   mometasone 0.1 % cream Commonly known as: ELOCON Apply to aa's face and chest QD-BID x 2 weeks. Then drop to 5d/qk PRN.   multivitamin tablet Take 1 tablet by mouth daily.   oxybutynin 10 MG 24 hr tablet Commonly known as: DITROPAN-XL TAKE 1 TABLET BY MOUTH NIGHTLY   rosuvastatin 5 MG tablet Commonly known as: CRESTOR TAKE 1 TABLET BY MOUTH DAILY   traZODone 50 MG tablet Commonly known as: DESYREL TAKE ONE-HALF TO ONE TABLET BY MOUTH AT BEDTIME AS NEEDED FOR SLEEP   triamcinolone cream 0.1 % Commonly known as: KENALOG Apply 1 application topically as directed. BID for 2 weeks then decrease to qd 5 times per week. Avoid face, groin, underarms.        Allergies:  Allergies  Allergen Reactions   Amoxicillin Hives, Itching and Rash    Has patient had a PCN reaction causing immediate rash, facial/tongue/throat swelling, SOB or lightheadedness with hypotension: Yes Has patient had a PCN reaction causing severe rash involving mucus membranes or skin necrosis: Yes Has patient had a PCN reaction that required hospitalization: No Has patient had a PCN reaction occurring within the last 10 years: Yes If all of the above answers are "NO", then may proceed with Cephalosporin use.    Lovastatin Other (See Comments)    myalgias   Penicillins Hives and Rash   Pravastatin Other (See Comments)    Myalgias    Family  History: Family History  Problem Relation Age of Onset   Coronary artery disease Mother    Heart attack Mother    Coronary artery disease Father    Stroke Father    Breast cancer Neg Hx     Social History:   reports that she quit smoking about 27 years ago. Her smoking use included cigarettes. She has a 25.00 pack-year smoking history. She has never used smokeless tobacco. She reports current alcohol use. She reports that she does not use drugs.  Physical Exam: BP (!) 169/76   Pulse 64   Ht 5' 5.5" (1.664 m)   BMI 35.56 kg/m   Constitutional:  Alert and oriented, no acute distress, nontoxic appearing HEENT: [place], AT Cardiovascular: No clubbing, cyanosis, or edema Respiratory: Normal respiratory effort, no increased work of breathing Skin: No rashes, bruises or suspicious lesions Neurologic: Grossly intact, no focal deficits, moving all 4  extremities Psychiatric: Normal mood and affect  Laboratory Data: Results for orders placed or performed in visit on 12/22/22  Microscopic Examination   Urine  Result Value Ref Range   WBC, UA 11-30 (A) 0 - 5 /hpf   RBC, Urine 0-2 0 - 2 /hpf   Epithelial Cells (non renal) >10 (A) 0 - 10 /hpf   Bacteria, UA Many (A) None seen/Few  Urinalysis, Complete  Result Value Ref Range   Specific Gravity, UA 1.020 1.005 - 1.030   pH, UA 6.5 5.0 - 7.5   Color, UA Yellow Yellow   Appearance Ur Cloudy (A) Clear   Leukocytes,UA Trace (A) Negative   Protein,UA Negative Negative/Trace   Glucose, UA Negative Negative   Ketones, UA Negative Negative   RBC, UA Negative Negative   Bilirubin, UA Negative Negative   Urobilinogen, Ur 0.2 0.2 - 1.0 mg/dL   Nitrite, UA Negative Negative   Microscopic Examination See below:    Assessment & Plan:   1. Mixed incontinence Well-managed on oxybutynin XL and Gemtesa.  Unfortunately, [place]ores is cost prohibitive with her insurance.  I checked and it appears Myrbetriq will also be greater than $300 per month.  She  is tolerating oxybutynin well, however we discussed the risks for cognitive decline and dementia with long-term use.  I think it is reasonable to stop this at this point.  She is scheduled for annual follow-up with me in late March and will plan to continue that appointment for symptom recheck on Gemtesa alone.  To that end, I gave her Gemtesa samples today.  We discussed that if she is not on her treatment goal on Gemtesa alone, we may consider adding a third line therapy.  We discussed PTNS, intravesical Botox, and InterStim.  She expressed understanding.  UA today with pyuria and bacteriuria, however urine sample appears contaminated and she is asymptomatic, low suspicion for infection at this time. - Urinalysis, Complete  Return for Follow-up in March as scheduled.  Debroah Loop, PA-C  [facility] [address], [place], [place] [address] [phone]

## 2022-12-27 ENCOUNTER — Other Ambulatory Visit: Payer: Self-pay | Admitting: Family Medicine

## 2022-12-27 DIAGNOSIS — E034 Atrophy of thyroid (acquired): Secondary | ICD-10-CM

## 2022-12-29 NOTE — Telephone Encounter (Signed)
Requested Prescriptions  Pending Prescriptions Disp Refills   levothyroxine (SYNTHROID) 88 MCG tablet [Pharmacy Med Name: LEVOTHYROXINE SODIUM 88 MCG TAB] 90 tablet 0    Sig: TAKE 1 TABLET EVERY DAY ON EMPTY [place] A GLASS OF WATER AT LEAST 30-60 [place]     Endocrinology:  Hypothyroid Agents Passed - 12/27/2022 12:02 PM      Passed - TSH in normal range and within 360 days    TSH  Date Value Ref Range Status  01/14/2022 1.960 0.450 - 4.500 uIU/mL Final         Passed - Valid encounter within last 12 months    Recent Outpatient Visits           5 months ago Type 2 diabetes mellitus with diabetic polyneuropathy, without long-term current use of insulin [facility])   [facility] Tally Joe T, FNP   11 months ago Blood blister   [facility] Tally Joe T, FNP   1 year ago Acute cystitis without hematuria   [facility] Birdie Sons, MD   1 year ago Medicare annual wellness visit, subsequent   [place], Dionne Bucy, MD   1 year ago Type 2 diabetes mellitus with diabetic polyneuropathy, without long-term current use of insulin [facility])   [facility] Jerrol Banana., MD       Future Appointments             In 3 weeks Gwyneth Sprout, [place], [place]   In 2 months Debroah Loop, [facility] [place]   In 6 months Ralene Bathe, MD [place]

## 2023-01-07 ENCOUNTER — Telehealth: Payer: Self-pay | Admitting: Physician Assistant

## 2023-01-07 NOTE — Telephone Encounter (Signed)
Pt called on Gemtesta but eliminated Oxybutin per [facility].Wendy Barnes is not working BY ITSELF having a lot of leaking. Needs something else with Gemesta in order to stop the leakage. Please advise [phone].

## 2023-01-08 NOTE — Telephone Encounter (Signed)
Spoke with patient and advised results, she will start oxybutynin again until her appt with sam

## 2023-01-22 ENCOUNTER — Ambulatory Visit: Payer: Medicare Other | Admitting: Family Medicine

## 2023-01-28 ENCOUNTER — Telehealth: Payer: Self-pay | Admitting: Family Medicine

## 2023-01-28 ENCOUNTER — Encounter: Payer: Self-pay | Admitting: Family Medicine

## 2023-01-28 ENCOUNTER — Ambulatory Visit (INDEPENDENT_AMBULATORY_CARE_PROVIDER_SITE_OTHER): Payer: Medicare Other | Admitting: Family Medicine

## 2023-01-28 VITALS — BP 148/69 | HR 86 | Temp 98.5°F | Ht 64.5 in | Wt 213.0 lb

## 2023-01-28 DIAGNOSIS — E034 Atrophy of thyroid (acquired): Secondary | ICD-10-CM

## 2023-01-28 DIAGNOSIS — N3946 Mixed incontinence: Secondary | ICD-10-CM

## 2023-01-28 DIAGNOSIS — N3289 Other specified disorders of bladder: Secondary | ICD-10-CM

## 2023-01-28 DIAGNOSIS — I152 Hypertension secondary to endocrine disorders: Secondary | ICD-10-CM

## 2023-01-28 DIAGNOSIS — J3089 Other allergic rhinitis: Secondary | ICD-10-CM | POA: Insufficient documentation

## 2023-01-28 DIAGNOSIS — N1831 Chronic kidney disease, stage 3a: Secondary | ICD-10-CM | POA: Insufficient documentation

## 2023-01-28 DIAGNOSIS — E1159 Type 2 diabetes mellitus with other circulatory complications: Secondary | ICD-10-CM

## 2023-01-28 DIAGNOSIS — J440 Chronic obstructive pulmonary disease with acute lower respiratory infection: Secondary | ICD-10-CM

## 2023-01-28 DIAGNOSIS — G629 Polyneuropathy, unspecified: Secondary | ICD-10-CM | POA: Diagnosis not present

## 2023-01-28 DIAGNOSIS — E1169 Type 2 diabetes mellitus with other specified complication: Secondary | ICD-10-CM | POA: Diagnosis not present

## 2023-01-28 DIAGNOSIS — R051 Acute cough: Secondary | ICD-10-CM | POA: Insufficient documentation

## 2023-01-28 DIAGNOSIS — E785 Hyperlipidemia, unspecified: Secondary | ICD-10-CM

## 2023-01-28 DIAGNOSIS — R7989 Other specified abnormal findings of blood chemistry: Secondary | ICD-10-CM | POA: Diagnosis not present

## 2023-01-28 DIAGNOSIS — E1142 Type 2 diabetes mellitus with diabetic polyneuropathy: Secondary | ICD-10-CM | POA: Diagnosis not present

## 2023-01-28 DIAGNOSIS — J209 Acute bronchitis, unspecified: Secondary | ICD-10-CM | POA: Insufficient documentation

## 2023-01-28 LAB — POCT GLYCOSYLATED HEMOGLOBIN (HGB A1C): Hemoglobin A1C: 6.3 % — AB (ref 4.0–5.6)

## 2023-01-28 MED ORDER — BENZONATATE 200 MG PO CAPS: 200.0000 mg | ORAL_CAPSULE | Freq: Three times a day (TID) | ORAL | 0 refills | Status: DC | PRN

## 2023-01-28 MED ORDER — OXYBUTYNIN CHLORIDE ER 10 MG PO TB24: 10.0000 mg | ORAL_TABLET | Freq: Every day | ORAL | 0 refills | Status: DC

## 2023-01-28 MED ORDER — BREZTRI AEROSPHERE 160-9-4.8 MCG/ACT IN AERO: 2.0000 | INHALATION_SPRAY | Freq: Two times a day (BID) | RESPIRATORY_TRACT | 0 refills | Status: DC

## 2023-01-28 MED ORDER — GABAPENTIN 100 MG PO CAPS: ORAL_CAPSULE | ORAL | 0 refills | Status: DC

## 2023-01-28 MED ORDER — LORATADINE 10 MG PO TABS: 10.0000 mg | ORAL_TABLET | Freq: Every day | ORAL | 11 refills | Status: DC

## 2023-01-28 MED ORDER — AMLODIPINE BESYLATE 10 MG PO TABS: 10.0000 mg | ORAL_TABLET | Freq: Every day | ORAL | 0 refills | Status: DC

## 2023-01-28 NOTE — Assessment & Plan Note (Signed)
Chronic, elevated Goal <130/<80 Increase Norvasc to 10; continue current Lotensin HCT at 40-25 mg daily 1 month f/u

## 2023-01-28 NOTE — Telephone Encounter (Signed)
Received a fax from covermymeds for benzonatate 235m   Key:  BLeonides [place]

## 2023-01-28 NOTE — Progress Notes (Signed)
Established patient visit  Patient: Wendy Barnes   DOB: 1941/09/11   82 y.o. Female  MRN: [id number] Visit Date: 01/28/2023  Today's healthcare provider: Gwyneth Sprout, FNP  Introduced to nurse practitioner role and practice setting.  All questions answered.  Discussed provider/patient relationship and expectations.  Subjective    HPI HPI   Diabetes  f/u- pt request to renew Benzonatate for cough and oxybutine for 1 month for incontinence until f/u for urology. Last edited by Elta Guadeloupe, CMA on 01/28/2023 10:58 AM.      Medications: Outpatient Medications Prior to Visit  Medication Sig   benazepril-hydrochlorthiazide (LOTENSIN HCT) 20-12.5 MG tablet Take 1 tablet by mouth 2 (two) times daily.   Biotin 2500 MCG CAPS Take 2,500 mcg by mouth daily.   Calcium Carbonate-Vitamin D 600-10 MG-MCG TABS Take by mouth.   Coenzyme Q10 100 MG capsule Take by mouth.   Cranberry-Vitamin C-Vitamin E (CRANBERRY PLUS VITAMIN C) 4200-20-3 MG-MG-UNIT CAPS Take 1 capsule by mouth daily.   levothyroxine (SYNTHROID) 88 MCG tablet TAKE 1 TABLET EVERY DAY ON EMPTY STOMACHWITH A GLASS OF WATER AT LEAST 30-60 MINBEFORE BREAKFAST   metFORMIN (GLUCOPHAGE) 500 MG tablet TAKE ONE TABLET (500 MG) BY MOUTH EVERY MORNING WITH BREAKFAST   mometasone (ELOCON) 0.1 % cream Apply to aa's face and chest QD-BID x 2 weeks. Then drop to 5d/qk PRN.   Omega-3 Fatty Acids (FISH OIL ULTRA) 1400 MG CAPS Take 1,400 mg by mouth daily.   rosuvastatin (CRESTOR) 5 MG tablet TAKE 1 TABLET BY MOUTH DAILY   traZODone (DESYREL) 50 MG tablet TAKE ONE-HALF TO ONE TABLET BY MOUTH AT BEDTIME AS NEEDED FOR SLEEP   triamcinolone cream (KENALOG) 0.1 % Apply 1 application topically as directed. BID for 2 weeks then decrease to qd 5 times per week. Avoid face, groin, underarms.   Vibegron (GEMTESA) 75 MG TABS Take 75 mg by mouth daily.   [DISCONTINUED] amLODipine (NORVASC) 5 MG tablet Take 1 tablet (5 mg total) by mouth daily.   [DISCONTINUED]  Multiple Vitamin (MULTIVITAMIN) tablet Take 1 tablet by mouth daily.   No facility-administered medications prior to visit.   Review of Systems    Objective    BP (!) 148/69 (BP Location: Left Arm, Patient Position: Sitting, Cuff Size: Large)   Pulse 86   Temp 98.5 F (36.9 C)   Ht 5' 4.5" (1.638 m)   Wt 213 lb (96.6 kg)   SpO2 98%   BMI 36.00 kg/m   Physical Exam Vitals and nursing note reviewed.  Constitutional:      General: She is not in acute distress.    Appearance: Normal appearance. She is obese. She is not ill-appearing, toxic-appearing or diaphoretic.  HENT:     Head: Normocephalic and atraumatic.     Right Ear: Tympanic membrane, ear canal and external ear normal.     Left Ear: Tympanic membrane, ear canal and external ear normal.     Nose: Rhinorrhea present.     Mouth/Throat:     Mouth: Mucous membranes are moist.     Pharynx: Oropharynx is clear. No oropharyngeal exudate or posterior oropharyngeal erythema.  Eyes:     Extraocular Movements: Extraocular movements intact.     Conjunctiva/sclera: Conjunctivae normal.     Pupils: Pupils are equal, round, and reactive to light.  Cardiovascular:     Rate and Rhythm: Normal rate and regular rhythm.     Pulses: Normal pulses.     Heart sounds: Normal heart  sounds. No murmur heard.    No friction rub. No gallop.  Pulmonary:     Effort: Pulmonary effort is normal. No respiratory distress.     Breath sounds: Normal breath sounds. No stridor. No wheezing, rhonchi or rales.     Comments: Upper airway constriction  Chest:     Chest wall: No tenderness.  Musculoskeletal:        General: No swelling, tenderness, deformity or signs of injury. Normal range of motion.     Cervical back: Normal range of motion and neck supple.     Right lower leg: No edema.     Left lower leg: No edema.  Skin:    General: Skin is warm and dry.     Capillary Refill: Capillary refill takes less than 2 seconds.     Coloration: Skin is not  jaundiced or pale.     Findings: No bruising, erythema, lesion or rash.  Neurological:     General: No focal deficit present.     Mental Status: She is alert and oriented to person, place, and time. Mental status is at baseline.     Cranial Nerves: No cranial nerve deficit.     Sensory: No sensory deficit.     Motor: No weakness.     Coordination: Coordination normal.  Psychiatric:        Mood and Affect: Mood normal.        Behavior: Behavior normal.        Thought Content: Thought content normal.        Judgment: Judgment normal.    Results for orders placed or performed in visit on 01/28/23  POCT HgB A1C  Result Value Ref Range   Hemoglobin A1C 6.3 (A) 4.0 - 5.6 %   HbA1c POC (<> result, manual entry)     HbA1c, POC (prediabetic range)     HbA1c, POC (controlled diabetic range)      Assessment & Plan     Problem List Items Addressed This Visit       Cardiovascular and Mediastinum   Hypertension associated with diabetes ([place])    Chronic, elevated Goal <130/<80 Increase Norvasc to 10; continue current Lotensin HCT at 40-25 mg daily 1 month f/u       Relevant Medications   amLODipine (NORVASC) 10 MG tablet   Other Relevant Orders   Comprehensive Metabolic Panel (CMET)     Respiratory   Acute bronchitis with COPD (HCC)    Acute 5 days of symptoms Trial of breztri to assist with use of antihistamine and tessalon LCTAB      Relevant Medications   benzonatate (TESSALON) 200 MG capsule   loratadine (CLARITIN) 10 MG tablet   Budeson-Glycopyrrol-Formoterol (BREZTRI AEROSPHERE) 160-9-4.8 MCG/ACT AERO   Non-seasonal allergic rhinitis    Clear sinus congestion; trial of antihistamine to assist      Relevant Medications   loratadine (CLARITIN) 10 MG tablet     Endocrine   Hyperlipidemia associated with type 2 diabetes mellitus (HCC)    Chronic, previously borderline with LDL of 69 Continue crestor 5 mg Repeat LP      Relevant Medications   amLODipine  (NORVASC) 10 MG tablet   Other Relevant Orders   Lipid panel   Hypothyroidism    Chronic, previously stable on 88 mcg levothyroxine Repeat TSH      Relevant Orders   TSH + free T4   Type 2 diabetes mellitus with diabetic polyneuropathy, without long-term current use of insulin (HCC) -  Primary    Chronic, A1c stable; however, neuropathy worsening Trial of gabapentin 100-300 mg qHS to assist Pt request to ortho vs podiatry- will proceed with podiatry at this time given hx of nail fungus as well as thickened nails  Continue to monitor      Relevant Medications   gabapentin (NEURONTIN) 100 MG capsule   Other Relevant Orders   POCT HgB A1C (Completed)   Ambulatory referral to Podiatry   Urine Microalbumin w/creat. ratio     Nervous and Auditory   Neuropathy    Acute on chronic, primarily R distal foot/ankle Trial of gabapentin Referral placed         Genitourinary   Stage 3a chronic kidney disease (HCC)    Chronic, stable  Repeat CMP Emphasis on diet and exercise as well as blood pressure control BP elevated today Goal <130/<80 Repeat appt in 1 month      Relevant Orders   Comprehensive Metabolic Panel (CMET)     Other   Acute cough    Appears to be bronchitis superimposed on COPD Trial of breztri with tessalon per pt request Denies sick contacts Defer oral steroids given mild symptoms       Relevant Medications   benzonatate (TESSALON) 200 MG capsule   Bladder spasms    Chronic, stable F/b urology Dual therapy to assist       Relevant Medications   oxybutynin (DITROPAN-XL) 10 MG 24 hr tablet   Elevated serum creatinine    Chronic, stable Encourage water intake 48/oz/day Repeat CMP      Mixed incontinence    Chronic, stable F/b urology Dual therapy to assist       Relevant Medications   oxybutynin (DITROPAN-XL) 10 MG 24 hr tablet   Morbid obesity (HCC)    Chronic, stable Associated with DM, HTN, HLD, hypothyroidism, neuropathy      Return  in about 4 weeks (around 02/25/2023) for blood pressure check, HTN management.     Vonna Kotyk, FNP, have reviewed all documentation for this visit. The documentation on 01/28/23 for the exam, diagnosis, procedures, and orders are all accurate and complete.  Gwyneth Sprout, [place] [phone] (phone) [phone] (fax)  [place]

## 2023-01-28 NOTE — Assessment & Plan Note (Signed)
Chronic, previously borderline with LDL of 69 Continue crestor 5 mg Repeat LP

## 2023-01-28 NOTE — Assessment & Plan Note (Addendum)
Chronic, A1c stable; however, neuropathy worsening Trial of gabapentin 100-300 mg qHS to assist Pt request to ortho vs podiatry- will proceed with podiatry at this time given hx of nail fungus as well as thickened nails  Continue to monitor

## 2023-01-28 NOTE — Assessment & Plan Note (Signed)
Acute 5 days of symptoms Trial of breztri to assist with use of antihistamine and tessalon LCTAB

## 2023-01-28 NOTE — Telephone Encounter (Signed)
Pt already pick-up the medication pay out of pocket.

## 2023-01-28 NOTE — Assessment & Plan Note (Signed)
Acute on chronic, primarily R distal foot/ankle Trial of gabapentin Referral placed

## 2023-01-28 NOTE — Assessment & Plan Note (Signed)
Chronic, previously stable on 88 mcg levothyroxine Repeat TSH

## 2023-01-28 NOTE — Assessment & Plan Note (Signed)
Chronic, stable F/b urology Dual therapy to assist

## 2023-01-28 NOTE — Assessment & Plan Note (Signed)
Chronic, stable  Repeat CMP Emphasis on diet and exercise as well as blood pressure control BP elevated today Goal <130/<80 Repeat appt in 1 month

## 2023-01-28 NOTE — Patient Instructions (Signed)
The CDC recommends two doses of Shingrix (the new shingles vaccine) separated by 2 to 6 months for adults age 80 years and older. I recommend checking with your insurance plan regarding coverage for this vaccine.    

## 2023-01-28 NOTE — Assessment & Plan Note (Signed)
Clear sinus congestion; trial of antihistamine to assist

## 2023-01-28 NOTE — Assessment & Plan Note (Signed)
Chronic, stable Encourage water intake 48/oz/day Repeat CMP

## 2023-01-28 NOTE — Assessment & Plan Note (Signed)
Appears to be bronchitis superimposed on COPD Trial of breztri with tessalon per pt request Denies sick contacts Defer oral steroids given mild symptoms

## 2023-01-28 NOTE — Assessment & Plan Note (Signed)
Chronic, stable Associated with DM, HTN, HLD, hypothyroidism, neuropathy

## 2023-01-29 DIAGNOSIS — E1169 Type 2 diabetes mellitus with other specified complication: Secondary | ICD-10-CM | POA: Diagnosis not present

## 2023-01-29 DIAGNOSIS — E034 Atrophy of thyroid (acquired): Secondary | ICD-10-CM | POA: Diagnosis not present

## 2023-01-29 DIAGNOSIS — I152 Hypertension secondary to endocrine disorders: Secondary | ICD-10-CM | POA: Diagnosis not present

## 2023-01-29 DIAGNOSIS — E1142 Type 2 diabetes mellitus with diabetic polyneuropathy: Secondary | ICD-10-CM | POA: Diagnosis not present

## 2023-01-29 DIAGNOSIS — N1831 Chronic kidney disease, stage 3a: Secondary | ICD-10-CM | POA: Diagnosis not present

## 2023-01-29 DIAGNOSIS — E785 Hyperlipidemia, unspecified: Secondary | ICD-10-CM | POA: Diagnosis not present

## 2023-01-29 DIAGNOSIS — E1159 Type 2 diabetes mellitus with other circulatory complications: Secondary | ICD-10-CM | POA: Diagnosis not present

## 2023-01-30 ENCOUNTER — Other Ambulatory Visit: Payer: Self-pay | Admitting: Family Medicine

## 2023-01-30 LAB — COMPREHENSIVE METABOLIC PANEL
ALT: 25 IU/L (ref 0–32)
AST: 25 IU/L (ref 0–40)
Albumin/Globulin Ratio: 2 (ref 1.2–2.2)
Albumin: 4.4 g/dL (ref 3.8–4.8)
Alkaline Phosphatase: 97 IU/L (ref 44–121)
BUN/Creatinine Ratio: 10 — ABNORMAL LOW (ref 12–28)
BUN: 9 mg/dL (ref 8–27)
Bilirubin Total: 0.4 mg/dL (ref 0.0–1.2)
CO2: 23 mmol/L (ref 20–29)
Calcium: 10.2 mg/dL (ref 8.7–10.3)
Chloride: 102 mmol/L (ref 96–106)
Creatinine, Ser: 0.89 mg/dL (ref 0.57–1.00)
Globulin, Total: 2.2 g/dL (ref 1.5–4.5)
Glucose: 135 mg/dL — ABNORMAL HIGH (ref 70–99)
Potassium: 4.2 mmol/L (ref 3.5–5.2)
Sodium: 141 mmol/L (ref 134–144)
Total Protein: 6.6 g/dL (ref 6.0–8.5)
eGFR: 66 mL/min/{1.73_m2} (ref >59)

## 2023-01-30 LAB — MICROALBUMIN / CREATININE URINE RATIO
Creatinine, Urine: 104.6 mg/dL
Microalb/Creat Ratio: 8 mg/g creat (ref 0–29)
Microalbumin, Urine: 8.8 ug/mL

## 2023-01-30 LAB — LIPID PANEL
Chol/HDL Ratio: 2.7 ratio (ref 0.0–4.4)
Cholesterol, Total: 159 mg/dL (ref 100–199)
HDL: 59 mg/dL (ref >39)
LDL Chol Calc (NIH): 86 mg/dL (ref 0–99)
Triglycerides: 74 mg/dL (ref 0–149)
VLDL Cholesterol Cal: 14 mg/dL (ref 5–40)

## 2023-01-30 LAB — TSH+FREE T4
Free T4: 1.36 ng/dL (ref 0.82–1.77)
TSH: 2.66 u[IU]/mL (ref 0.450–4.500)

## 2023-01-30 MED ORDER — AZITHROMYCIN 500 MG PO TABS: 500.0000 mg | ORAL_TABLET | Freq: Every day | ORAL | 0 refills | Status: DC

## 2023-01-30 MED ORDER — DOXYCYCLINE HYCLATE 100 MG PO TABS: 100.0000 mg | ORAL_TABLET | Freq: Two times a day (BID) | ORAL | 0 refills | Status: DC

## 2023-01-30 NOTE — Progress Notes (Signed)
Normal urine micro; unclear if this was already discussed with pt via phone

## 2023-01-30 NOTE — Progress Notes (Signed)
Slight increase in bad/LDL cholesterol. Continue to recommend diet low in saturated fat and regular exercise - 30 min at least 5 times per week. Can increase crestor further if desired to assist with lifestyle changes.  The 10-year ASCVD risk score (Arnett DK, et al., 2019) is: 63%   Values used to calculate the score:     Age: 82 years     Sex: Female     Is Non-Hispanic African American: No     Diabetic: Yes     Tobacco smoker: No     Systolic Blood Pressure: 123456 mmHg     Is BP treated: Yes     HDL Cholesterol: 59 mg/dL     Total Cholesterol: 159 mg/dL  Urine pending. All other WDL. Please let us know if you have any questions.  Thank you, Gwyneth Sprout, [address] [place], [place] [address] [phone] (phone) [phone] (fax) [place]

## 2023-02-27 ENCOUNTER — Ambulatory Visit: Payer: Medicare Other | Admitting: Family Medicine

## 2023-03-03 ENCOUNTER — Other Ambulatory Visit: Payer: Self-pay | Admitting: Family Medicine

## 2023-03-03 ENCOUNTER — Ambulatory Visit (INDEPENDENT_AMBULATORY_CARE_PROVIDER_SITE_OTHER): Payer: Medicare Other | Admitting: Podiatry

## 2023-03-03 ENCOUNTER — Encounter: Payer: Self-pay | Admitting: Podiatry

## 2023-03-03 VITALS — BP 165/76 | HR 61

## 2023-03-03 DIAGNOSIS — I152 Hypertension secondary to endocrine disorders: Secondary | ICD-10-CM

## 2023-03-03 DIAGNOSIS — E119 Type 2 diabetes mellitus without complications: Secondary | ICD-10-CM | POA: Diagnosis not present

## 2023-03-03 NOTE — Progress Notes (Signed)
Chief Complaint  Patient presents with   Peripheral Neuropathy    "I have Neuropathy.  My Diabetic doctor sent me." N - neuropathy L - bilateral D - 2 yrs O - gradually worse C - sharp pains A - at night while in bed T - Gabapentin, Mega Life Cream - OTC    HPI: 82 y.o. female PMHx T2DM presenting today for annual routine diabetic foot exam.  Last A1c 01/28/2023 was 6.3%.  Patient states that recently she has been developing some neuropathy to the bilateral toes worse at night when she goes to bed.  She was prescribed about 2 weeks ago gabapentin 100 mg 1-3 tablets QHS.  She says it is definitely helping.  Also she states that she develops ingrown toenails to the left great toe and she would like to have it debrided today  Past Medical History:  Diagnosis Date   Anxiety    Cancer ([place])    skin   Hyperchloremia    Hyperlipidemia    Hypertension    Hypothyroidism    Squamous cell carcinoma of skin 09/18/2008   left temple   T2DM (type 2 diabetes mellitus) ([place])    Thyroid disease    Varicella    Vertigo     Past Surgical History:  Procedure Laterality Date   CATARACT EXTRACTION W/PHACO Left 11/17/2017   Procedure: CATARACT EXTRACTION PHACO AND INTRAOCULAR LENS PLACEMENT (IOC)-LEFT DIABETIC;  Surgeon: Birder Robson, MD;  Location: [facility] ORS;  Service: Ophthalmology;  Laterality: Left;  [place] 00:36 AP% 15.1 CDE 5.46 Fluid pack lot # [id number]   CATARACT EXTRACTION W/PHACO Right 12/22/2017   Procedure: CATARACT EXTRACTION PHACO AND INTRAOCULAR LENS PLACEMENT (IOC);  Surgeon: Birder Robson, MD;  Location: [facility] ORS;  Service: Ophthalmology;  Laterality: Right;  [place] 00:46.1 AP% 12.9 CDE 5.97 Fluid pack lot # [id number]   COLONOSCOPY N/A 06/19/2022   Procedure: COLONOSCOPY;  Surgeon: Annamaria Helling, DO;  Location: [facility] ENDOSCOPY;  Service: Gastroenterology;  Laterality: N/A;  DM   COLONOSCOPY WITH PROPOFOL N/A 04/24/2016   Procedure: COLONOSCOPY WITH PROPOFOL;  Surgeon:  Manya Silvas, MD;  Location: [facility] END[facility]COPY;  Service: Endoscopy;  Laterality: N/A;   DILATION AND CURETTAGE OF UTERUS  2002   JOINT REPLACEMENT Right    REPLACEMENT TOTAL KNEE Right 02/20/2014   THYROIDECTOMY  2010   TONSILLECTOMY     TONSILLECTOMY AND ADENOIDECTOMY  1950   TOTAL KNEE ARTHROPLASTY  2015   right knee    Allergies  Allergen Reactions   Amoxicillin Hives, Itching and Rash    Has patient had a PCN reaction causing immediate rash, facial/tongue/throat swelling, SOB or lightheadedness with hypotension: Yes Has patient had a PCN reaction causing severe rash involving mucus membranes or skin necrosis: Yes Has patient had a PCN reaction that required hospitalization: No Has patient had a PCN reaction occurring within the last 10 years: Yes If all of the above answers are "NO", then may proceed with Cephalosporin use.    Lovastatin Other (See Comments)    myalgias   Penicillins Hives and Rash   Pravastatin Other (See Comments)    Myalgias     Physical Exam: General: The patient is alert and oriented x3 in no acute distress.  Dermatology: Skin is warm, dry and supple bilateral lower extremities. Negative for open lesions or macerations.  Hyperkeratotic incurvated nail noted to the left hallux nail plate  Vascular: Palpable pedal pulses bilaterally. Capillary refill within normal limits.  Negative for any significant edema  or erythema  Neurological: Light touch and protective threshold grossly intact  Musculoskeletal Exam: No pedal deformities noted  Assessment: 1.  Type 2 diabetes mellitus; uncomplicated, controlled 2.  Pain due to onychomycosis of toenail left hallux nail plate  -Patient evaluated.  Comprehensive diabetic foot exam performed today -Mechanical debridement of the left hallux nail plate was performed today using nail nipper without incident or bleeding.  Patient felt relief -Continue gabapentin 100 mg nightly -Continue wearing good supportive  shoes and sneakers.  Advised against going barefoot -Return to clinic annually       Edrick Kins, DPM [facility]  Dr. Edrick Kins, DPM    [address], [place] [address]                Office [phone]  Fax [phone]

## 2023-03-03 NOTE — Progress Notes (Unsigned)
I,J'ya E Raesean Bartoletti,acting as a scribe for Gwyneth Sprout, FNP.,have documented all relevant documentation on the behalf of Gwyneth Sprout, FNP,as directed by  Gwyneth Sprout, FNP while in the presence of Gwyneth Sprout, FNP.  Established patient visit  Patient: Wendy Barnes   DOB: [birth year]   82 y.o. Female  MRN: [id number] Visit Date: 03/04/2023  Today's healthcare provider: Gwyneth Sprout, FNP  Re Introduced to nurse practitioner role and practice setting.  All questions answered.  Discussed provider/patient relationship and expectations.  Subjective    HPI  Hypertension, follow-up  BP Readings from Last 3 Encounters:  03/04/23 131/75  03/03/23 (!) 165/76  01/28/23 (!) 148/69   Wt Readings from Last 3 Encounters:  03/04/23 219 lb 8 oz (99.6 kg)  01/28/23 213 lb (96.6 kg)  08/05/22 217 lb (98.4 kg)     She was last seen for hypertension 1 months ago.  BP at that visit was 148/69. Management since that visit includes increase Norvasc to 10 mg; continue lotensin-HCT at 40-25mg  daily.  She reports excellent compliance with treatment. She is not having side effects.   Patient doesn't measure BP at home. Patient says that she went to the foot doctor and complains of swollen ankles.   Symptoms: No chest pain No chest pressure  No palpitations No syncope  No dyspnea No orthopnea  No paroxysmal nocturnal dyspnea No lower extremity edema   Pertinent labs Lab Results  Component Value Date   CHOL 159 01/29/2023   HDL 59 01/29/2023   LDLCALC 86 01/29/2023   TRIG 74 01/29/2023   CHOLHDL 2.7 01/29/2023   Lab Results  Component Value Date   NA 141 01/29/2023   K 4.2 01/29/2023   CREATININE 0.89 01/29/2023   EGFR 66 01/29/2023   GLUCOSE 135 (H) 01/29/2023   TSH 2.660 01/29/2023     The 10-year ASCVD risk score (Arnett DK, et al., 2019) is: 54%  ---------------------------------------------------------------------------------------------------   Medications: Outpatient  Medications Prior to Visit  Medication Sig   benazepril-hydrochlorthiazide (LOTENSIN HCT) 20-12.5 MG tablet Take 1 tablet by mouth 2 (two) times daily.   Biotin 2500 MCG CAPS Take 2,500 mcg by mouth daily.   Calcium Carbonate-Vitamin D 600-10 MG-MCG TABS Take by mouth.   Coenzyme Q10 100 MG capsule Take by mouth.   Cranberry-Vitamin C-Vitamin E (CRANBERRY PLUS VITAMIN C) 4200-20-3 MG-MG-UNIT CAPS Take 1 capsule by mouth daily.   levothyroxine (SYNTHROID) 88 MCG tablet TAKE 1 TABLET EVERY DAY ON EMPTY STOMACHWITH A GLASS OF WATER AT LEAST 30-60 MINBEFORE BREAKFAST   metFORMIN (GLUCOPHAGE) 500 MG tablet TAKE ONE TABLET (500 MG) BY MOUTH EVERY MORNING WITH BREAKFAST   mometasone (ELOCON) 0.1 % cream Apply to aa's face and chest QD-BID x 2 weeks. Then drop to 5d/qk PRN.   Omega-3 Fatty Acids (FISH OIL ULTRA) 1400 MG CAPS Take 1,400 mg by mouth daily.   Vibegron (GEMTESA) 75 MG TABS Take 75 mg by mouth daily.   [DISCONTINUED] amLODipine (NORVASC) 10 MG tablet TAKE ONE TABLET BY MOUTH EVERY DAY   [DISCONTINUED] gabapentin (NEURONTIN) 100 MG capsule Take 1-3 capsules prior to bed for neuropathy   [DISCONTINUED] oxybutynin (DITROPAN-XL) 10 MG 24 hr tablet Take 1 tablet (10 mg total) by mouth at bedtime.   [DISCONTINUED] rosuvastatin (CRESTOR) 5 MG tablet TAKE 1 TABLET BY MOUTH DAILY   [DISCONTINUED] traZODone (DESYREL) 50 MG tablet TAKE ONE-HALF TO ONE TABLET BY MOUTH AT BEDTIME AS NEEDED FOR SLEEP   [DISCONTINUED]  azithromycin (ZITHROMAX) 500 MG tablet Take 1 tablet (500 mg total) by mouth daily.   [DISCONTINUED] benzonatate (TESSALON) 200 MG capsule Take 1 capsule (200 mg total) by mouth 3 (three) times daily as needed for cough. (Patient not taking: Reported on 03/04/2023)   [DISCONTINUED] Budeson-Glycopyrrol-Formoterol (BREZTRI AEROSPHERE) 160-9-4.8 MCG/ACT AERO Inhale 2 puffs into the lungs in the morning and at bedtime.   [DISCONTINUED] doxycycline (VIBRA-TABS) 100 MG tablet Take 1 tablet (100 mg  total) by mouth 2 (two) times daily. (Patient not taking: Reported on 03/04/2023)   [DISCONTINUED] loratadine (CLARITIN) 10 MG tablet Take 1 tablet (10 mg total) by mouth daily.   [DISCONTINUED] triamcinolone cream (KENALOG) 0.1 % Apply 1 application topically as directed. BID for 2 weeks then decrease to qd 5 times per week. Avoid face, groin, underarms. (Patient not taking: Reported on 03/03/2023)   No facility-administered medications prior to visit.    Review of Systems  Constitutional:  Negative for fatigue.  Respiratory:  Negative for chest tightness.   Neurological:  Negative for dizziness and headaches.   Last CBC Lab Results  Component Value Date   WBC 9.8 05/21/2021   HGB 13.4 05/21/2021   HCT 40.4 05/21/2021   MCV 86 05/21/2021   MCH 28.4 05/21/2021   RDW 13.2 05/21/2021   PLT 248 123456   Last metabolic panel Lab Results  Component Value Date   GLUCOSE 135 (H) 01/29/2023   NA 141 01/29/2023   K 4.2 01/29/2023   CL 102 01/29/2023   CO2 23 01/29/2023   BUN 9 01/29/2023   CREATININE 0.89 01/29/2023   EGFR 66 01/29/2023   CALCIUM 10.2 01/29/2023   PROT 6.6 01/29/2023   ALBUMIN 4.4 01/29/2023   LABGLOB 2.2 01/29/2023   AGRATIO 2.0 01/29/2023   BILITOT 0.4 01/29/2023   ALKPHOS 97 01/29/2023   AST 25 01/29/2023   ALT 25 01/29/2023   ANIONGAP 6 (L) 02/22/2014   Last lipids Lab Results  Component Value Date   CHOL 159 01/29/2023   HDL 59 01/29/2023   LDLCALC 86 01/29/2023   TRIG 74 01/29/2023   CHOLHDL 2.7 01/29/2023   Last hemoglobin A1c Lab Results  Component Value Date   HGBA1C 6.3 (A) 01/28/2023   Last thyroid functions Lab Results  Component Value Date   TSH 2.660 01/29/2023     Objective    BP 131/75 (BP Location: Right Arm, Patient Position: Sitting, Cuff Size: Large)   Pulse 81   Temp 97.7 F (36.5 C) (Oral)   Resp 13   Ht 5\' 6"  (1.676 m)   Wt 219 lb 8 oz (99.6 kg)   SpO2 100%   BMI 35.43 kg/m   BP Readings from Last 3  Encounters:  03/04/23 131/75  03/03/23 (!) 165/76  01/28/23 (!) 148/69   Wt Readings from Last 3 Encounters:  03/04/23 219 lb 8 oz (99.6 kg)  01/28/23 213 lb (96.6 kg)  08/05/22 217 lb (98.4 kg)   Physical Exam Vitals and nursing note reviewed.  Constitutional:      General: She is not in acute distress.    Appearance: Normal appearance. She is obese. She is not ill-appearing, toxic-appearing or diaphoretic.  HENT:     Head: Normocephalic and atraumatic.  Cardiovascular:     Rate and Rhythm: Normal rate and regular rhythm.     Pulses: Normal pulses.     Heart sounds: Normal heart sounds. No murmur heard.    No friction rub. No gallop.  Pulmonary:  Effort: Pulmonary effort is normal. No respiratory distress.     Breath sounds: Normal breath sounds. No stridor. No wheezing, rhonchi or rales.  Chest:     Chest wall: No tenderness.  Musculoskeletal:        General: No swelling, tenderness, deformity or signs of injury. Normal range of motion.     Right lower leg: No edema.     Left lower leg: No edema.  Skin:    General: Skin is warm and dry.     Capillary Refill: Capillary refill takes less than 2 seconds.     Coloration: Skin is not jaundiced or pale.     Findings: No bruising, erythema, lesion or rash.  Neurological:     General: No focal deficit present.     Mental Status: She is alert and oriented to person, place, and time. Mental status is at baseline.     Cranial Nerves: No cranial nerve deficit.     Sensory: No sensory deficit.     Motor: No weakness.     Coordination: Coordination normal.  Psychiatric:        Mood and Affect: Mood normal.        Behavior: Behavior normal.        Thought Content: Thought content normal.        Judgment: Judgment normal.     No results found for any visits on 03/04/23.  Assessment & Plan     Problem List Items Addressed This Visit       Cardiovascular and Mediastinum   Hypertension associated with diabetes ([place]) -  Primary    Chronic, close to goal; defer medication changes at this time Continue diet/exercise recommendations Remains on  -lotensin hct 40-25 daily -norvasc 10 daily       Relevant Medications   amLODipine (NORVASC) 10 MG tablet   rosuvastatin (CRESTOR) 5 MG tablet     Endocrine   Type 2 diabetes mellitus with diabetic polyneuropathy, without long-term current use of insulin (HCC)    Chronic, stable Remains on -ACE -Statin -encouraged to get vaccines at pharmacy - 500 mg metformin QD -gapa at 100 mg QHS      Relevant Medications   gabapentin (NEURONTIN) 100 MG capsule   traZODone (DESYREL) 50 MG tablet   rosuvastatin (CRESTOR) 5 MG tablet     Nervous and Auditory   Neuropathy    Improved with gaba 100 mg qHS        Genitourinary   Stage 3a chronic kidney disease (HCC)    Chronic, BP well controlled at this time Continue to monitor labs q6 months (8/24)        Other   Ankle edema, bilateral    Acute, self limiting No edema on exam Reports that edema improved overnight when feet are elevated and pt is sleeping       Bladder spasms    Chronic, stable Followed by urology       Relevant Medications   oxybutynin (DITROPAN-XL) 10 MG 24 hr tablet   Hypercholesteremia    With DM Remains on crestor 5 mg LDL goal of <70      Relevant Medications   amLODipine (NORVASC) 10 MG tablet   rosuvastatin (CRESTOR) 5 MG tablet   Mixed incontinence    Chronic, stable Followed by urology      Relevant Medications   oxybutynin (DITROPAN-XL) 10 MG 24 hr tablet   Morbid obesity (HCC)    Chronic, stable Body mass index is 35.43 kg/m.  Associated with DM, HTN, HLD      Return in about 5 months (around 08/04/2023) for chonic disease management.     Vonna Kotyk, FNP, have reviewed all documentation for this visit. The documentation on 03/04/23 for the exam, diagnosis, procedures, and orders are all accurate and complete.  Gwyneth Sprout, [place] [phone] (phone) [phone] (fax)  [place]

## 2023-03-04 ENCOUNTER — Ambulatory Visit (INDEPENDENT_AMBULATORY_CARE_PROVIDER_SITE_OTHER): Payer: Medicare Other | Admitting: Family Medicine

## 2023-03-04 ENCOUNTER — Encounter: Payer: Self-pay | Admitting: Family Medicine

## 2023-03-04 VITALS — BP 131/75 | HR 81 | Temp 97.7°F | Resp 13 | Ht 66.0 in | Wt 219.5 lb

## 2023-03-04 DIAGNOSIS — N3289 Other specified disorders of bladder: Secondary | ICD-10-CM

## 2023-03-04 DIAGNOSIS — E1142 Type 2 diabetes mellitus with diabetic polyneuropathy: Secondary | ICD-10-CM | POA: Diagnosis not present

## 2023-03-04 DIAGNOSIS — E1159 Type 2 diabetes mellitus with other circulatory complications: Secondary | ICD-10-CM

## 2023-03-04 DIAGNOSIS — M25471 Effusion, right ankle: Secondary | ICD-10-CM

## 2023-03-04 DIAGNOSIS — G629 Polyneuropathy, unspecified: Secondary | ICD-10-CM

## 2023-03-04 DIAGNOSIS — N1831 Chronic kidney disease, stage 3a: Secondary | ICD-10-CM | POA: Diagnosis not present

## 2023-03-04 DIAGNOSIS — I152 Hypertension secondary to endocrine disorders: Secondary | ICD-10-CM | POA: Diagnosis not present

## 2023-03-04 DIAGNOSIS — E7800 Pure hypercholesterolemia, unspecified: Secondary | ICD-10-CM | POA: Diagnosis not present

## 2023-03-04 DIAGNOSIS — M25472 Effusion, left ankle: Secondary | ICD-10-CM | POA: Diagnosis not present

## 2023-03-04 DIAGNOSIS — N3946 Mixed incontinence: Secondary | ICD-10-CM

## 2023-03-04 MED ORDER — AMLODIPINE BESYLATE 10 MG PO TABS: 10.0000 mg | ORAL_TABLET | Freq: Every day | ORAL | 3 refills | Status: DC

## 2023-03-04 MED ORDER — TRAZODONE HCL 50 MG PO TABS: 50.0000 mg | ORAL_TABLET | Freq: Every day | ORAL | 3 refills | Status: DC

## 2023-03-04 MED ORDER — OXYBUTYNIN CHLORIDE ER 10 MG PO TB24: 10.0000 mg | ORAL_TABLET | Freq: Every day | ORAL | 3 refills | Status: DC

## 2023-03-04 MED ORDER — GABAPENTIN 100 MG PO CAPS: 100.0000 mg | ORAL_CAPSULE | Freq: Every day | ORAL | 0 refills | Status: DC

## 2023-03-04 MED ORDER — ROSUVASTATIN CALCIUM 5 MG PO TABS: 5.0000 mg | ORAL_TABLET | Freq: Every day | ORAL | 3 refills | Status: DC

## 2023-03-04 NOTE — Patient Instructions (Signed)
The CDC recommends two doses of Shingrix (the new shingles vaccine) separated by 2 to 6 months for adults age 80 years and older. I recommend checking with your insurance plan regarding coverage for this vaccine.    

## 2023-03-04 NOTE — Assessment & Plan Note (Signed)
Chronic, stable Followed by urology

## 2023-03-04 NOTE — Assessment & Plan Note (Addendum)
Chronic, close to goal; defer medication changes at this time Continue diet/exercise recommendations Remains on  -lotensin hct 40-25 daily -norvasc 10 daily

## 2023-03-04 NOTE — Assessment & Plan Note (Signed)
Improved with gaba 100 mg qHS

## 2023-03-04 NOTE — Assessment & Plan Note (Signed)
Chronic, BP well controlled at this time Continue to monitor labs q6 months (8/24)

## 2023-03-04 NOTE — Assessment & Plan Note (Signed)
Chronic, stable Body mass index is 35.43 kg/m. Associated with DM, HTN, HLD

## 2023-03-04 NOTE — Assessment & Plan Note (Signed)
Acute, self limiting No edema on exam Reports that edema improved overnight when feet are elevated and pt is sleeping

## 2023-03-04 NOTE — Assessment & Plan Note (Addendum)
Chronic, stable Remains on -ACE -Statin -encouraged to get vaccines at pharmacy - 500 mg metformin QD -gapa at 100 mg QHS

## 2023-03-04 NOTE — Assessment & Plan Note (Signed)
With DM Remains on crestor 5 mg LDL goal of <70

## 2023-03-06 ENCOUNTER — Ambulatory Visit (INDEPENDENT_AMBULATORY_CARE_PROVIDER_SITE_OTHER): Payer: Medicare Other | Admitting: Physician Assistant

## 2023-03-06 ENCOUNTER — Encounter: Payer: Self-pay | Admitting: Physician Assistant

## 2023-03-06 VITALS — BP 144/71 | HR 57 | Ht 64.5 in | Wt 220.0 lb

## 2023-03-06 DIAGNOSIS — N3946 Mixed incontinence: Secondary | ICD-10-CM

## 2023-03-06 LAB — BLADDER SCAN AMB NON-IMAGING

## 2023-03-06 NOTE — Progress Notes (Signed)
03/06/2023 11:21 AM   Wendy Barnes [birth year] [id number]  CC: Chief Complaint  Patient presents with   Follow-up   Over Active Bladder   HPI: Wendy Barnes is a 82 y.o. female with PMH mixed urinary incontinence well-managed on oxybutynin XL 10 mg and Gemtesa who presents today for annual follow-up.   Today she reports she previously tried stopping oxybutynin altogether, however her symptoms were poorly controlled on Gemtesa alone.  She has since resumed dual pharmacotherapy and is pleased with her symptom control.  She admits to some occasional weaker urinary stream, so she tends to take both of her urinary medications together every other day.  She feels this gives her good urinary control and also maintains adequate urine stream.  She wonders if this is safe to do.  PVR 149 mL.  PMH: Past Medical History:  Diagnosis Date   Anxiety    Cancer (Wendy Barnes)    skin   Hyperchloremia    Hyperlipidemia    Hypertension    Hypothyroidism    Squamous cell carcinoma of skin 09/18/2008   left temple   T2DM (type 2 diabetes mellitus) (Wendy Barnes)    Thyroid disease    Varicella    Vertigo     Surgical History: Past Surgical History:  Procedure Laterality Date   CATARACT EXTRACTION W/PHACO Left 11/17/2017   Procedure: CATARACT EXTRACTION PHACO AND INTRAOCULAR LENS PLACEMENT (IOC)-LEFT DIABETIC;  Surgeon: Birder Robson, MD;  Location: [facility] ORS;  Service: Ophthalmology;  Laterality: Left;  [place] 00:36 AP% 15.1 CDE 5.46 Fluid pack lot # [id number]   CATARACT EXTRACTION W/PHACO Right 12/22/2017   Procedure: CATARACT EXTRACTION PHACO AND INTRAOCULAR LENS PLACEMENT (IOC);  Surgeon: Birder Robson, MD;  Location: [facility] ORS;  Service: Ophthalmology;  Laterality: Right;  [place] 00:46.1 AP% 12.9 CDE 5.97 Fluid pack lot # [id number]   COLONOSCOPY N/A 06/19/2022   Procedure: COLONOSCOPY;  Surgeon: Annamaria Helling, DO;  Location: [facility] ENDOSCOPY;  Service: Gastroenterology;  Laterality: N/A;  DM    COLONOSCOPY WITH PROPOFOL N/A 04/24/2016   Procedure: COLONOSCOPY WITH PROPOFOL;  Surgeon: Manya Silvas, MD;  Location: [facility] ENDOSCOPY;  Service: Endoscopy;  Laterality: N/A;   DILATION AND CURETTAGE OF UTERUS  2002   JOINT REPLACEMENT Right    REPLACEMENT TOTAL KNEE Right 02/20/2014   THYROIDECTOMY  2010   TONSILLECTOMY     TONSILLECTOMY AND ADENOIDECTOMY  1950   TOTAL KNEE ARTHROPLASTY  2015   right knee    Home Medications:  Allergies as of 03/06/2023       Reactions   Amoxicillin Hives, Itching, Rash   Has patient had a PCN reaction causing immediate rash, facial/tongue/throat swelling, SOB or lightheadedness with hypotension: Yes Has patient had a PCN reaction causing severe rash involving mucus membranes or skin necrosis: Yes Has patient had a PCN reaction that required hospitalization: No Has patient had a PCN reaction occurring within the last 10 years: Yes If all of the above answers are "NO", then may proceed with Cephalosporin use.   Lovastatin Other (See Comments)   myalgias   Penicillins Hives, Rash   Pravastatin Other (See Comments)   Myalgias        Medication List        Accurate as of March 06, 2023 11:21 AM. If you have any questions, ask your nurse or doctor.          STOP taking these medications    mometasone 0.1 % cream Commonly known as: ELOCON Stopped by: Debroah Loop,  PA-C       TAKE these medications    amLODipine 10 MG tablet Commonly known as: NORVASC Take 1 tablet (10 mg total) by mouth daily.   benazepril-hydrochlorthiazide 20-12.5 MG tablet Commonly known as: LOTENSIN HCT Take 1 tablet by mouth 2 (two) times daily.   Biotin 2500 MCG Caps Take 2,500 mcg by mouth daily.   Calcium Carbonate-Vitamin D 600-10 MG-MCG Tabs Take by mouth.   Coenzyme Q10 100 MG capsule Take by mouth.   Cranberry Plus Vitamin C 4200-20-3 MG-MG-UNIT Caps Generic drug: Cranberry-Vitamin C-Vitamin E Take 1 capsule by mouth daily.    Fish Oil Ultra 1400 MG Caps Take 1,400 mg by mouth daily.   gabapentin 100 MG capsule Commonly known as: NEURONTIN Take 1 capsule (100 mg total) by mouth at bedtime. Take 1 capsule prior to bed for neuropathy   Gemtesa 75 MG Tabs Generic drug: Vibegron Take 75 mg by mouth daily.   levothyroxine 88 MCG tablet Commonly known as: SYNTHROID TAKE 1 TABLET EVERY DAY ON EMPTY STOMACHWITH A GLASS OF WATER AT LEAST 30-60 MINBEFORE BREAKFAST   metFORMIN 500 MG tablet Commonly known as: GLUCOPHAGE TAKE ONE TABLET (500 MG) BY MOUTH EVERY MORNING WITH BREAKFAST   oxybutynin 10 MG 24 hr tablet Commonly known as: DITROPAN-XL Take 1 tablet (10 mg total) by mouth at bedtime.   rosuvastatin 5 MG tablet Commonly known as: CRESTOR Take 1 tablet (5 mg total) by mouth daily.   traZODone 50 MG tablet Commonly known as: DESYREL Take 1 tablet (50 mg total) by mouth at bedtime.        Allergies:  Allergies  Allergen Reactions   Amoxicillin Hives, Itching and Rash    Has patient had a PCN reaction causing immediate rash, facial/tongue/throat swelling, SOB or lightheadedness with hypotension: Yes Has patient had a PCN reaction causing severe rash involving mucus membranes or skin necrosis: Yes Has patient had a PCN reaction that required hospitalization: No Has patient had a PCN reaction occurring within the last 10 years: Yes If all of the above answers are "NO", then may proceed with Cephalosporin use.    Lovastatin Other (See Comments)    myalgias   Penicillins Hives and Rash   Pravastatin Other (See Comments)    Myalgias    Family History: Family History  Problem Relation Age of Onset   Coronary artery disease Mother    Heart attack Mother    Coronary artery disease Father    Stroke Father    Breast cancer Neg Hx     Social History:   reports that she quit smoking about 27 years ago. Her smoking use included cigarettes. She has a 25.00 pack-year smoking history. She has never  used smokeless tobacco. She reports current alcohol use. She reports that she does not use drugs.  Physical Exam: BP (!) 144/71   Pulse (!) 57   Ht 5' 4.5" (1.638 m)   Wt 220 lb (99.8 kg)   BMI 37.18 kg/m   Constitutional:  Alert and oriented, no acute distress, nontoxic appearing HEENT: [place], AT Cardiovascular: No clubbing, cyanosis, or edema Respiratory: Normal respiratory effort, no increased work of breathing Skin: No rashes, bruises or suspicious lesions Neurologic: Grossly intact, no focal deficits, moving all 4 extremities Psychiatric: Normal mood and affect  Laboratory Data: Results for orders placed or performed in visit on 03/06/23  Bladder Scan (Post Void Residual) in office  Result Value Ref Range   Scan Result 149ML    Assessment &  Plan:   1. Mixed incontinence Well-controlled on dual pharmacotherapy oxybutynin XL 10 mg and Gemtesa.  Will continue Gemtesa samples due to cost concerns.  We discussed continuing her current regimen despite long-term risks of cognitive decline/dementia, trial of trospium as an oxybutynin alternative, or cessation of anticholinergic and therapy augmentation with third line therapies including PTNS.  She is most interested in trying trospium, however it looks like this is not on her insurance formulary.  Due to cost, she would rather continue her current regimen, which is reasonable.  We discussed that every other day dosing of her urologic agents does not pose a safety concern from my perspective.  Her PVR is borderline today, will continue to monitor this. - Bladder Scan (Post Void Residual) in office  Return for 1 year follow-up OAB w/PVR.  Debroah Loop, PA-C  [facility] [address] [place], [place] [address] [phone]

## 2023-03-31 ENCOUNTER — Other Ambulatory Visit: Payer: Self-pay | Admitting: Family Medicine

## 2023-03-31 DIAGNOSIS — E034 Atrophy of thyroid (acquired): Secondary | ICD-10-CM

## 2023-04-07 ENCOUNTER — Other Ambulatory Visit: Payer: Self-pay | Admitting: Family Medicine

## 2023-04-07 DIAGNOSIS — E1142 Type 2 diabetes mellitus with diabetic polyneuropathy: Secondary | ICD-10-CM

## 2023-04-29 ENCOUNTER — Other Ambulatory Visit: Payer: Self-pay | Admitting: Family Medicine

## 2023-04-29 DIAGNOSIS — E1159 Type 2 diabetes mellitus with other circulatory complications: Secondary | ICD-10-CM

## 2023-04-29 NOTE — Telephone Encounter (Signed)
Requested Prescriptions  Pending Prescriptions Disp Refills   benazepril-hydrochlorthiazide (LOTENSIN HCT) 20-12.5 MG tablet [Pharmacy Med Name: BENAZEPRIL-HCTZ 20-12.5 MG TAB] 180 tablet 0    Sig: TAKE ONE TABLET (20-12.5 MG) BY MOUTH TWICE DAILY     Cardiovascular:  ACEI + Diuretic Combos Failed - 04/29/2023  8:27 AM      Failed - Last BP in normal range    BP Readings from Last 1 Encounters:  03/06/23 (!) 144/71         Passed - Na in normal range and within 180 days    Sodium  Date Value Ref Range Status  01/29/2023 141 134 - 144 mmol/L Final  02/22/2014 134 (L) 136 - 145 mmol/L Final         Passed - K in normal range and within 180 days    Potassium  Date Value Ref Range Status  01/29/2023 4.2 3.5 - 5.2 mmol/L Final  02/22/2014 3.9 3.5 - 5.1 mmol/L Final         Passed - Cr in normal range and within 180 days    Creatinine  Date Value Ref Range Status  02/22/2014 0.58 (L) 0.60 - 1.30 mg/dL Final   Creatinine, Ser  Date Value Ref Range Status  01/29/2023 0.89 0.57 - 1.00 mg/dL Final         Passed - eGFR is 30 or above and within 180 days    EGFR (African American)  Date Value Ref Range Status  02/22/2014 >60  Final   GFR calc Af Amer  Date Value Ref Range Status  01/02/2021 64 >59 mL/min/1.73 Final    Comment:    **In accordance with recommendations from the NKF-ASN Task force,**   [facility] is in the process of updating its eGFR calculation to the   2021 CKD-EPI creatinine equation that estimates kidney function   without a race variable.    EGFR (Non-African Amer.)  Date Value Ref Range Status  02/22/2014 >60  Final    Comment:    eGFR values <47mL/min/1.73 m2 may be an indication of chronic kidney disease (CKD). Calculated eGFR is useful in patients with stable renal function. The eGFR calculation will not be reliable in acutely ill patients when serum creatinine is changing rapidly. It is not useful in  patients on dialysis. The eGFR calculation may  not be applicable to patients at the low and high extremes of body sizes, pregnant women, and vegetarians.    GFR calc non Af Amer  Date Value Ref Range Status  01/02/2021 55 (L) >59 mL/min/1.73 Final   eGFR  Date Value Ref Range Status  01/29/2023 66 >59 mL/min/1.73 Final         Passed - Patient is not pregnant      Passed - Valid encounter within last 6 months    Recent Outpatient Visits           1 month ago Hypertension associated with diabetes [facility])   [place] [facility] Merita Norton T, FNP   3 months ago Type 2 diabetes mellitus with diabetic polyneuropathy, without long-term current use of insulin [facility])   [place] [facility] Merita Norton T, FNP   9 months ago Type 2 diabetes mellitus with diabetic polyneuropathy, without long-term current use of insulin [facility])   [place] [facility] Merita Norton T, FNP   1 year ago Blood blister   [facility] Jacky Kindle, FNP   1 year ago  Acute cystitis without hematuria   [facility] Malva Limes, MD       Future Appointments             In 2 months Deirdre Evener, MD [facility]   In 10 months Carman Ching, [facility] [place]

## 2023-06-05 ENCOUNTER — Other Ambulatory Visit: Payer: Self-pay | Admitting: Family Medicine

## 2023-06-05 DIAGNOSIS — E1142 Type 2 diabetes mellitus with diabetic polyneuropathy: Secondary | ICD-10-CM

## 2023-06-05 NOTE — Telephone Encounter (Signed)
Requested Prescriptions  Pending Prescriptions Disp Refills   metFORMIN (GLUCOPHAGE) 500 MG tablet [Pharmacy Med Name: METFORMIN HCL 500 MG TAB] 90 tablet 0    Sig: TAKE ONE TABLET (500 MG) BY MOUTH EVERY MORNING WITH BREAKFAST     Endocrinology:  Diabetes - Biguanides Failed - 06/05/2023 12:45 PM      Failed - B12 Level in normal range and within 720 days    No results found for: "VITAMINB12"       Failed - CBC within normal limits and completed in the last 12 months    WBC  Date Value Ref Range Status  05/21/2021 9.8 3.4 - 10.8 x10E3/uL Final  02/08/2014 8.7 3.6 - 11.0 x10 3/mm 3 Final   RBC  Date Value Ref Range Status  05/21/2021 4.72 3.77 - 5.28 x10E6/uL Final  02/08/2014 4.33 3.80 - 5.20 X10 6/mm 3 Final   Hemoglobin  Date Value Ref Range Status  05/21/2021 13.4 11.1 - 15.9 g/dL Final   Hematocrit  Date Value Ref Range Status  05/21/2021 40.4 34.0 - 46.6 % Final   MCHC  Date Value Ref Range Status  05/21/2021 33.2 31.5 - 35.7 g/dL Final  78/46/9629 52.8 32.0 - 36.0 g/dL Final   [facility]  Date Value Ref Range Status  05/21/2021 28.4 26.6 - 33.0 pg Final  02/08/2014 30.2 26.0 - 34.0 pg Final   MCV  Date Value Ref Range Status  05/21/2021 86 79 - 97 fL Final  02/08/2014 88 80 - 100 fL Final   No results found for: "PLTCOUNTKUC", "LABPLAT", "POCPLA" RDW  Date Value Ref Range Status  05/21/2021 13.2 11.7 - 15.4 % Final  02/08/2014 14.1 11.5 - 14.5 % Final         Passed - Cr in normal range and within 360 days    Creatinine  Date Value Ref Range Status  02/22/2014 0.58 (L) 0.60 - 1.30 mg/dL Final   Creatinine, Ser  Date Value Ref Range Status  01/29/2023 0.89 0.57 - 1.00 mg/dL Final         Passed - HBA1C is between 0 and 7.9 and within 180 days    Hemoglobin A1C  Date Value Ref Range Status  01/28/2023 6.3 (A) 4.0 - 5.6 % Final   Hgb A1c MFr Bld  Date Value Ref Range Status  01/14/2022 6.4 (H) 4.8 - 5.6 % Final    Comment:             Prediabetes:  5.7 - 6.4          Diabetes: >6.4          Glycemic control for adults with diabetes: <7.0          Passed - eGFR in normal range and within 360 days    EGFR (African American)  Date Value Ref Range Status  02/22/2014 >60  Final   GFR calc Af Amer  Date Value Ref Range Status  01/02/2021 64 >59 mL/min/1.73 Final    Comment:    **In accordance with recommendations from the NKF-ASN Task force,**   [facility] is in the process of updating its eGFR calculation to the   2021 CKD-EPI creatinine equation that estimates kidney function   without a race variable.    EGFR (Non-African Amer.)  Date Value Ref Range Status  02/22/2014 >60  Final    Comment:    eGFR values <109mL/min/1.73 m2 may be an indication of chronic kidney disease (CKD). Calculated eGFR is useful in patients  with stable renal function. The eGFR calculation will not be reliable in acutely ill patients when serum creatinine is changing rapidly. It is not useful in  patients on dialysis. The eGFR calculation may not be applicable to patients at the low and high extremes of body sizes, pregnant women, and vegetarians.    GFR calc non Af Amer  Date Value Ref Range Status  01/02/2021 55 (L) >59 mL/min/1.73 Final   eGFR  Date Value Ref Range Status  01/29/2023 66 >59 mL/min/1.73 Final         Passed - Valid encounter within last 6 months    Recent Outpatient Visits           3 months ago Hypertension associated with diabetes [facility])   [place] [facility] Merita Norton T, FNP   4 months ago Type 2 diabetes mellitus with diabetic polyneuropathy, without long-term current use of insulin [facility])   [place] [facility] Merita Norton T, FNP   10 months ago Type 2 diabetes mellitus with diabetic polyneuropathy, without long-term current use of insulin [facility])   [place] [facility] Merita Norton T, FNP   1 year ago Blood blister   [place] [facility]  Merita Norton T, FNP   1 year ago Acute cystitis without hematuria   [facility] Malva Limes, MD       Future Appointments             In 1 month Deirdre Evener, MD [facility]   In 9 months Carman Ching, [facility]

## 2023-06-10 ENCOUNTER — Other Ambulatory Visit: Payer: Self-pay | Admitting: Family Medicine

## 2023-06-10 DIAGNOSIS — Z1231 Encounter for screening mammogram for malignant neoplasm of breast: Secondary | ICD-10-CM

## 2023-06-25 DIAGNOSIS — M1712 Unilateral primary osteoarthritis, left knee: Secondary | ICD-10-CM | POA: Diagnosis not present

## 2023-06-25 DIAGNOSIS — Z96651 Presence of right artificial knee joint: Secondary | ICD-10-CM | POA: Diagnosis not present

## 2023-07-01 ENCOUNTER — Ambulatory Visit: Payer: Medicare Other | Admitting: Dermatology

## 2023-07-03 ENCOUNTER — Other Ambulatory Visit: Payer: Self-pay | Admitting: Family Medicine

## 2023-07-03 DIAGNOSIS — E034 Atrophy of thyroid (acquired): Secondary | ICD-10-CM

## 2023-07-06 NOTE — Telephone Encounter (Signed)
Requested Prescriptions  Pending Prescriptions Disp Refills   levothyroxine (SYNTHROID) 88 MCG tablet [Pharmacy Med Name: LEVOTHYROXINE SODIUM 88 MCG TAB] 90 tablet 0    Sig: TAKE 1 TABLET EVERY DAY ON EMPTY STOMACHWITH A GLASS OF WATER AT LEAST 30-60 MINBEFORE BREAKFAST     Endocrinology:  Hypothyroid Agents Passed - 07/03/2023  3:58 PM      Passed - TSH in normal range and within 360 days    TSH  Date Value Ref Range Status  01/29/2023 2.660 0.450 - 4.500 uIU/mL Final         Passed - Valid encounter within last 12 months    Recent Outpatient Visits           4 months ago Hypertension associated with diabetes [facility])   [place] [facility] Mer[facility] T, FNP   5 months ago Type 2 diabetes mellitus with diabetic polyneuropathy, without long-term current use of insulin [facility])   [place] [facility] Merita [facility] T, FNP   11 months ago Type 2 diabetes mellitus with diabetic polyneuropathy, without long-term current use of insulin [facility])   [place] [facility] Merita Norton T, FNP   1 year ago Blood blister   [place] [facility] Merita Norton T, FNP   1 year ago Acute cystitis without hematuria   [place] Malva Limes, MD       Future Appointments             In 2 weeks Deirdre Evener, MD [place]  [facility]   In 8 months Carman Ching, [facility] [place]

## 2023-07-09 ENCOUNTER — Other Ambulatory Visit: Payer: Self-pay | Admitting: Family Medicine

## 2023-07-09 DIAGNOSIS — E1142 Type 2 diabetes mellitus with diabetic polyneuropathy: Secondary | ICD-10-CM

## 2023-07-09 NOTE — Telephone Encounter (Signed)
Requested Prescriptions  Pending Prescriptions Disp Refills   gabapentin (NEURONTIN) 100 MG capsule [Pharmacy Med Name: GABAPENTIN 100 MG CAP] 90 capsule 0    Sig: TAKE 1-3 CAPSULES PRIOR TO BED FOR NEUROPATHY     Neurology: Anticonvulsants - gabapentin Passed - 07/09/2023 11:33 AM      Passed - Cr in normal range and within 360 days    Creatinine  Date Value Ref Range Status  02/22/2014 0.58 (L) 0.60 - 1.30 mg/dL Final   Creatinine, Ser  Date Value Ref Range Status  01/29/2023 0.89 0.57 - 1.00 mg/dL Final         Passed - Completed PHQ-2 or PHQ-9 in the last 360 days      Passed - Valid encounter within last 12 months    Recent Outpatient Visits           4 months ago Hypertension associated with diabetes [facility])   [place] [facility] Merita [facility] T, FNP   5 months ago Type 2 diabetes mellitus with diabetic polyneuropathy, without long-term current use of insulin [facility])   [place] [facility] Merita Norton T, FNP   11 months ago Type 2 diabetes mellitus with diabetic polyneuropathy, without long-term current use of insulin [facility])   [facility] Merita Norton T, FNP   1 year ago Blood blister   [place] [facility] Merita Norton T, FNP   1 year ago Acute cystitis without hematuria   [facility] Malva Limes, MD       Future Appointments             In 1 week Deirdre Evener, MD [place] [facility]   In 7 months Carman Ching, [facility] [place]

## 2023-07-20 ENCOUNTER — Ambulatory Visit: Payer: Medicare Other | Admitting: Dermatology

## 2023-07-20 DIAGNOSIS — L820 Inflamed seborrheic keratosis: Secondary | ICD-10-CM

## 2023-07-20 DIAGNOSIS — Z1283 Encounter for screening for malignant neoplasm of skin: Secondary | ICD-10-CM

## 2023-07-20 DIAGNOSIS — D229 Melanocytic nevi, unspecified: Secondary | ICD-10-CM

## 2023-07-20 DIAGNOSIS — I781 Nevus, non-neoplastic: Secondary | ICD-10-CM

## 2023-07-20 DIAGNOSIS — Z8589 Personal history of malignant neoplasm of other organs and systems: Secondary | ICD-10-CM

## 2023-07-20 DIAGNOSIS — L814 Other melanin hyperpigmentation: Secondary | ICD-10-CM | POA: Diagnosis not present

## 2023-07-20 DIAGNOSIS — L578 Other skin changes due to chronic exposure to nonionizing radiation: Secondary | ICD-10-CM

## 2023-07-20 DIAGNOSIS — Z7189 Other specified counseling: Secondary | ICD-10-CM

## 2023-07-20 DIAGNOSIS — L738 Other specified follicular disorders: Secondary | ICD-10-CM

## 2023-07-20 DIAGNOSIS — D1801 Hemangioma of skin and subcutaneous tissue: Secondary | ICD-10-CM

## 2023-07-20 DIAGNOSIS — W908XXA Exposure to other nonionizing radiation, initial encounter: Secondary | ICD-10-CM

## 2023-07-20 DIAGNOSIS — Z85828 Personal history of other malignant neoplasm of skin: Secondary | ICD-10-CM

## 2023-07-20 DIAGNOSIS — L821 Other seborrheic keratosis: Secondary | ICD-10-CM | POA: Diagnosis not present

## 2023-07-20 DIAGNOSIS — D492 Neoplasm of unspecified behavior of bone, soft tissue, and skin: Secondary | ICD-10-CM

## 2023-07-20 DIAGNOSIS — D3617 Benign neoplasm of peripheral nerves and autonomic nervous system of trunk, unspecified: Secondary | ICD-10-CM

## 2023-07-20 NOTE — Patient Instructions (Addendum)
Cryotherapy Aftercare  Wash gently with soap and water everyday.   Apply Vaseline and Band-Aid daily until healed.   Wound Care Instructions  Cleanse wound gently with soap and water once a day then pat dry with clean gauze. Apply a thin coat of Petrolatum (petroleum jelly, "Vaseline") over the wound (unless you have an allergy to this). We recommend that you use a new, sterile tube of Vaseline. Do not pick or remove scabs. Do not remove the yellow or white "healing tissue" from the base of the wound.  Cover the wound with fresh, clean, nonstick gauze and secure with paper tape. You may use Band-Aids in place of gauze and tape if the wound is small enough, but would recommend trimming much of the tape off as there is often too much. Sometimes Band-Aids can irritate the skin.  You should call the office for your biopsy report after 1 week if you have not already been contacted.  If you experience any problems, such as abnormal amounts of bleeding, swelling, significant bruising, significant pain, or evidence of infection, please call the office immediately.  FOR ADULT SURGERY PATIENTS: If you need something for pain relief you may take 1 extra strength Tylenol (acetaminophen) AND 2 Ibuprofen (200mg  each) together every 4 hours as needed for pain. (do not take these if you are allergic to them or if you have a reason you should not take them.) Typically, you may only need pain medication for 1 to 3 days.    Due to recent changes in healthcare laws, you may see results of your pathology and/or laboratory studies on MyChart before the doctors have had a chance to review them. We understand that in some cases there may be results that are confusing or concerning to you. Please understand that not all results are received at the same time and often the doctors may need to interpret multiple results in order to provide you with the best plan of care or course of treatment. Therefore, we ask that you  please give [place] 2 business days to thoroughly review all your results before contacting the office for clarification. Should we see a critical lab result, you will be contacted sooner.   If You Need Anything After Your Visit  If you have any questions or concerns for your doctor, please call our main line at [phone] and press option 4 to reach your doctor's medical assistant. If no one answers, please leave a voicemail as directed and we will return your call as soon as possible. Messages left after 4 pm will be answered the following business day.   You may also send [place] a message via MyChart. We typically respond to MyChart messages within 1-2 business days.  For prescription refills, please ask your pharmacy to contact our office. Our fax number is [phone].  If you have an urgent issue when the clinic is closed that cannot wait until the next business day, you can page your doctor at the number below.    Please note that while we do our best to be available for urgent issues outside of office hours, we are not available 24/7.   If you have an urgent issue and are unable to reach [place], you may choose to seek medical care at your doctor's office, retail clinic, urgent care center, or emergency room.  If you have a medical emergency, please immediately call 911 or go to the emergency department.  Pager Numbers  - Dr. Gwen Pounds: [phone]  - Dr.  Roseanne Reno: [phone]  In the event of inclement weather, please call our main line at [phone] for an update on the status of any delays or closures.  Dermatology Medication Tips: Please keep the boxes that topical medications come in in order to help keep track of the instructions about where and how to use these. Pharmacies typically print the medication instructions only on the boxes and not directly on the medication tubes.   If your medication is too expensive, please contact our office at [phone] option 4 or send [place] a  message through MyChart.   We are unable to tell what your co-pay for medications will be in advance as this is different depending on your insurance coverage. However, we may be able to find a substitute medication at lower cost or fill out paperwork to get insurance to cover a needed medication.   If a prior authorization is required to get your medication covered by your insurance company, please allow [place] 1-2 business days to complete this process.  Drug prices often vary depending on where the prescription is filled and some pharmacies may offer cheaper prices.  The website www.goodrx.com contains coupons for medications through different pharmacies. The prices here do not account for what the cost may be with help from insurance (it may be cheaper with your insurance), but the website can give you the price if you did not use any insurance.  - You can print the associated coupon and take it with your prescription to the pharmacy.  - You may also stop by our office during regular business hours and pick up a GoodRx coupon card.  - If you need your prescription sent electronically to a different pharmacy, notify our office through [facility] or by phone at [phone] option 4.     Si Usted Necesita Algo Despus de Su Visita  Tambin puede enviarnos un mensaje a travs de Clinical cytogeneticist. Por lo general respondemos a los mensajes de MyChart en el transcurso de 1 a 2 das hbiles.  Para renovar recetas, por favor pida a su farmacia que se ponga en contacto con nuestra oficina. Annie Sable de fax es [place] [phone].  Si tiene un asunto urgente cuando la clnica est cerrada y que no puede esperar hasta el siguiente da hbil, puede llamar/localizar a su doctor(a) al nmero que aparece a continuacin.   Por favor, tenga en cuenta que aunque hacemos todo lo posible para estar disponibles para asuntos urgentes fuera del horario de [place], no estamos disponibles las 24 horas del da, los 7  [address]  [address] de la [place].   Si tiene un problema urgente y no puede comunicarse con nosotros, puede optar por buscar atencin mdica  en el consultorio de su doctor(a), en una clnica privada, en un centro de atencin urgente o en una sala de emergencias.  Si tiene Engineer, drilling, por favor llame inmediatamente al 911 o vaya a la sala de emergencias.  Nmeros de bper  - Dr. Gwen Pounds: [phone]  - Dra. Roseanne Reno: [phone]  En caso de inclemencias del [place], por favor llame a Lacy Duverney principal al [phone] para una actualizacin sobre el [place] de cualquier retraso o cierre.  Consejos para la medicacin en dermatologa: Por favor, guarde las cajas en las que vienen los medicamentos de uso tpico para ayudarle a seguir las instrucciones sobre dnde y cmo usarlos. Las farmacias generalmente imprimen las instrucciones del medicamento slo en las cajas y no directamente en los tubos del [place].   Si su  medicamento es [place] caro, por favor, pngase en contacto con Rolm Gala llamando al [phone] y presione la opcin 4 o envenos un mensaje a travs de Clinical cytogeneticist.   No podemos decirle cul ser su copago por los medicamentos por adelantado ya que esto es diferente dependiendo de la cobertura de su seguro. Sin embargo, es posible que podamos encontrar un medicamento sustituto a Audiological scientist un formulario para que el seguro cubra el medicamento que se considera necesario.   Si se requiere una autorizacin previa para que su compaa de seguros [place] su medicamento, por favor permtanos de 1 a 2 das hbiles para completar [address]  Los precios de los medicamentos varan con frecuencia dependiendo del Environmental consultant de dnde se surte la receta y alguna farmacias pueden ofrecer precios ms baratos.  El sitio web www.goodrx.com tiene cupones para medicamentos de Health and safety inspector. Los precios aqu no tienen en cuenta lo que podra costar con la ayuda del seguro (puede ser  ms barato con su seguro), pero el sitio web puede darle el precio si no utiliz Tourist information centre manager.  - Puede imprimir el cupn correspondiente y llevarlo con su receta a la farmacia.  - Tambin puede pasar por nuestra oficina durante el horario de atencin regular y Education officer, museum una tarjeta de cupones de GoodRx.  - Si necesita que su receta se enve electrnicamente a una farmacia diferente, informe a nuestra oficina a travs de MyChart de [place] o por telfono llamando al [phone] y presione la opcin 4.

## 2023-07-20 NOTE — Progress Notes (Signed)
Follow-Up Visit   Subjective  Wendy Barnes is a 82 y.o. female who presents for the following: Skin Cancer Screening and Full Body Skin Exam, hx of SCC Patient c/o a new growth on the right chest.   The patient presents for Total-Body Skin Exam (TBSE) for skin cancer screening and mole check. The patient has spots, moles and lesions to be evaluated, some may be new or changing and the patient may have concern these could be cancer.  The following portions of the chart were reviewed this encounter and updated as appropriate: medications, allergies, medical history  Review of Systems:  No other skin or systemic complaints except as noted in HPI or Assessment and Plan.  Objective  Well appearing patient in no apparent distress; mood and affect are within normal limits.  A full examination was performed including scalp, head, eyes, ears, nose, lips, neck, chest, axillae, abdomen, back, buttocks, bilateral upper extremities, bilateral lower extremities, hands, feet, fingers, toes, fingernails, and toenails. All findings within normal limits unless otherwise noted below.   Relevant physical exam findings are noted in the Assessment and Plan. left inferior cheek x 1, chest/sternum x 1, left chest x 1 (3) Stuck-on, waxy, tan-brown papules  -- Discussed benign etiology and prognosis.   right chest 1.3 cm flesh papule        Assessment & Plan   SKIN CANCER SCREENING PERFORMED TODAY.  ACTINIC DAMAGE - Chronic condition, secondary to cumulative UV/sun exposure - diffuse scaly erythematous macules with underlying dyspigmentation - Recommend daily broad spectrum sunscreen SPF 30+ to sun-exposed areas, reapply every 2 hours as needed.  - Staying in the shade or wearing long sleeves, sun glasses (UVA+UVB protection) and wide brim hats (4-inch brim around the entire circumference of the hat) are also recommended for sun protection.  - Call for new or changing lesions.  LENTIGINES,  SEBORRHEIC KERATOSES, HEMANGIOMAS - Benign normal skin lesions - Benign-appearing - Call for any changes  TELANGIECTASIA Exam: dilated blood vessel at the left chest  Treatment Plan: Benign appearing on exam Call for changes  Counseling for BBL / IPL / Laser and Coordination of Care Discussed the treatment option of Broad Band Light (BBL) /Intense Pulsed Light (IPL)/ Laser for skin discoloration, including brown spots and redness.  Typically we recommend at least 1-3 treatment sessions about 5-8 weeks apart for best results.  Cannot have tanned skin when BBL performed, and regular use of sunscreen/photoprotection is advised after the procedure to help maintain results. The patient's condition may also require "maintenance treatments" in the future.  The fee for BBL / laser treatments is $350 per treatment session for the whole face.  A fee can be quoted for other parts of the body.  Insurance typically does not pay for BBL/laser treatments and therefore the fee is an out-of-pocket cost. Recommend prophylactic valtrex treatment. Once scheduled for procedure, will send Rx in prior to patient's appointment.   MELANOCYTIC NEVI - Tan-brown and/or pink-flesh-colored symmetric macules and papules - Benign appearing on exam today - Observation - Call clinic for new or changing moles - Recommend daily use of broad spectrum spf 30+ sunscreen to sun-exposed areas.   Sebaceous Hyperplasia - Small yellow papules with a central dell - Benign-appearing - Observe. Call for changes.   HISTORY OF SQUAMOUS CELL CARCINOMA OF THE SKIN - No evidence of recurrence today - No lymphadenopathy - Recommend regular full body skin exams - Recommend daily broad spectrum sunscreen SPF 30+ to sun-exposed areas, reapply every  2 hours as needed.  - Call if any new or changing lesions are noted between office visits   Inflamed seborrheic keratosis (3) left inferior cheek x 1, chest/sternum x 1, left chest x  1  Symptomatic, irritating, patient would like treated.   Destruction of lesion - left inferior cheek x 1, chest/sternum x 1, left chest x 1 (3) Complexity: simple   Destruction method: cryotherapy   Informed consent: discussed and consent obtained   Timeout:  patient name, date of birth, surgical site, and procedure verified Lesion destroyed using liquid nitrogen: Yes   Region frozen until ice ball extended beyond lesion: Yes   Outcome: patient tolerated procedure well with no complications   Post-procedure details: wound care instructions given    Neoplasm of skin right chest  Epidermal / dermal shaving  Lesion diameter (cm):  1.3 Informed consent: discussed and consent obtained   Timeout: patient name, date of birth, surgical site, and procedure verified   Procedure prep:  Patient was prepped and draped in usual sterile fashion Prep type:  Isopropyl alcohol Anesthesia: the lesion was anesthetized in a standard fashion   Anesthetic:  1% lidocaine w/ epinephrine 1-100,000 buffered w/ 8.4% NaHCO3 Hemostasis achieved with: pressure, aluminum chloride and electrodesiccation   Outcome: patient tolerated procedure well   Post-procedure details: sterile dressing applied and wound care instructions given   Dressing type: bandage and petrolatum    Specimen 1 - Surgical pathology Differential Diagnosis: Nevus R/O Dysplastic nevus    Check Margins: No  Nevus R/O Dysplastic nevus    Return in about 1 year (around 07/19/2024) for TBSE, hx of SCC .  IAngelique Holm, CMA, am acting as scribe for Armida Sans, MD .   Documentation: I have reviewed the above documentation for accuracy and completeness, and I agree with the above.  Armida Sans, MD

## 2023-07-28 ENCOUNTER — Telehealth: Payer: Self-pay

## 2023-07-28 ENCOUNTER — Encounter: Payer: Self-pay | Admitting: Dermatology

## 2023-07-28 NOTE — Telephone Encounter (Signed)
-----   Message from Armida Sans sent at 07/27/2023  6:05 PM EDT ----- Diagnosis Skin , right chest NEUROFIBROMA, BASE INVOLVED  Benign neurofibroma No further treatment needed

## 2023-07-28 NOTE — Telephone Encounter (Signed)
Patient advised of BX results .aw 

## 2023-07-28 NOTE — Telephone Encounter (Signed)
Left pt msg to call for bx result/sh °

## 2023-07-30 DIAGNOSIS — Z961 Presence of intraocular lens: Secondary | ICD-10-CM | POA: Diagnosis not present

## 2023-07-30 DIAGNOSIS — H43813 Vitreous degeneration, bilateral: Secondary | ICD-10-CM | POA: Diagnosis not present

## 2023-07-30 DIAGNOSIS — E119 Type 2 diabetes mellitus without complications: Secondary | ICD-10-CM | POA: Diagnosis not present

## 2023-07-30 LAB — HM DIABETES EYE EXAM

## 2023-08-03 ENCOUNTER — Encounter: Payer: Self-pay | Admitting: Family Medicine

## 2023-08-03 ENCOUNTER — Ambulatory Visit
Admission: RE | Admit: 2023-08-03 | Discharge: 2023-08-03 | Disposition: A | Discharge status: Home / Self Care | Payer: Medicare Other | Source: Ambulatory Visit | Attending: Family Medicine | Admitting: Family Medicine

## 2023-08-03 DIAGNOSIS — Z1231 Encounter for screening mammogram for malignant neoplasm of breast: Secondary | ICD-10-CM | POA: Diagnosis present

## 2023-08-13 ENCOUNTER — Other Ambulatory Visit: Payer: Self-pay | Admitting: Family Medicine

## 2023-08-13 ENCOUNTER — Ambulatory Visit (INDEPENDENT_AMBULATORY_CARE_PROVIDER_SITE_OTHER): Payer: Medicare Other | Admitting: Family Medicine

## 2023-08-13 ENCOUNTER — Telehealth: Payer: Self-pay | Admitting: Family Medicine

## 2023-08-13 ENCOUNTER — Encounter: Payer: Self-pay | Admitting: Family Medicine

## 2023-08-13 VITALS — BP 136/65 | HR 60 | Temp 97.6°F | Resp 16 | Ht 68.0 in | Wt 224.0 lb

## 2023-08-13 DIAGNOSIS — E1142 Type 2 diabetes mellitus with diabetic polyneuropathy: Secondary | ICD-10-CM

## 2023-08-13 DIAGNOSIS — E1169 Type 2 diabetes mellitus with other specified complication: Secondary | ICD-10-CM

## 2023-08-13 DIAGNOSIS — M4726 Other spondylosis with radiculopathy, lumbar region: Secondary | ICD-10-CM

## 2023-08-13 DIAGNOSIS — N183 Chronic kidney disease, stage 3 unspecified: Secondary | ICD-10-CM

## 2023-08-13 DIAGNOSIS — Z0001 Encounter for general adult medical examination with abnormal findings: Secondary | ICD-10-CM | POA: Diagnosis not present

## 2023-08-13 DIAGNOSIS — M25471 Effusion, right ankle: Secondary | ICD-10-CM

## 2023-08-13 DIAGNOSIS — E785 Hyperlipidemia, unspecified: Secondary | ICD-10-CM | POA: Diagnosis not present

## 2023-08-13 DIAGNOSIS — M25472 Effusion, left ankle: Secondary | ICD-10-CM | POA: Diagnosis not present

## 2023-08-13 DIAGNOSIS — E1159 Type 2 diabetes mellitus with other circulatory complications: Secondary | ICD-10-CM

## 2023-08-13 DIAGNOSIS — Z23 Encounter for immunization: Secondary | ICD-10-CM

## 2023-08-13 DIAGNOSIS — I152 Hypertension secondary to endocrine disorders: Secondary | ICD-10-CM

## 2023-08-13 DIAGNOSIS — Z0000 Encounter for general adult medical examination without abnormal findings: Secondary | ICD-10-CM

## 2023-08-13 DIAGNOSIS — E034 Atrophy of thyroid (acquired): Secondary | ICD-10-CM | POA: Diagnosis not present

## 2023-08-13 MED ORDER — SEMAGLUTIDE(0.25 OR 0.5MG/DOS) 2 MG/1.5ML ~~LOC~~ SOPN: 0.5000 mg | PEN_INJECTOR | SUBCUTANEOUS | 0 refills | Status: DC

## 2023-08-13 MED ORDER — TIRZEPATIDE 2.5 MG/0.5ML ~~LOC~~ SOAJ: 2.5000 mg | SUBCUTANEOUS | 0 refills | Status: DC

## 2023-08-13 NOTE — Assessment & Plan Note (Signed)
Chronic, now on crestor 5 mg and tolerating well with addition of CoQ 10 OTC Repeat LP The 10-year ASCVD risk score (Arnett DK, et al., 2019) is: 56.8% I continue to recommend diet low in saturated fat and regular exercise - 30 min at least 5 times per week LDL goal recommended of <70

## 2023-08-13 NOTE — Assessment & Plan Note (Signed)
Acute on chronic, worsening No signs of poor venous or arterial flow Continue to recommend labs and then can trial changing antihypertensive medications to assist

## 2023-08-13 NOTE — Assessment & Plan Note (Signed)
Chronic, borderline Goal of 129/79 Complaints of LE edema Continue to recommend reduced sodium diet and exercise Repeat CBC, CMP, TSH Continues on norvasc 10 mg which may be contributing to the edema as well as lotensin hct 20-12.5

## 2023-08-13 NOTE — Assessment & Plan Note (Signed)
Chronic, stable Repeat Vit D to further assist with overall bone health Continue to recommend calcium supplementation and walking

## 2023-08-13 NOTE — Patient Instructions (Signed)
The CDC recommends two doses of Shingrix (the new shingles vaccine) separated by 2 to 6 months for adults age 80 years and older. I recommend checking with your insurance plan regarding coverage for this vaccine.    

## 2023-08-13 NOTE — Assessment & Plan Note (Signed)
No concerns; reports plan to go to [place] for 82th birthday Things to do to keep yourself healthy  - Exercise at least 30-45 minutes a day, 3-4 days a week.  - Eat a low-fat diet with lots of fruits and vegetables, up to 7-9 servings per day.  - Seatbelts can save your life. Wear them always.  - Smoke detectors on every level of your home, check batteries every year.  - Eye Doctor - have an eye exam every 1-2 years  - Safe sex - if you may be exposed to STDs, use a condom.  - Alcohol -  If you drink, do it moderately, less than 2 drinks per day.  - Health Care Power of Attorney. Choose someone to speak for you if you are not able.  - Depression is common in our stressful world.If you're feeling down or losing interest in things you normally enjoy, please come in for a visit.  - Violence - If anyone is threatening or hurting you, please call immediately.

## 2023-08-13 NOTE — Progress Notes (Signed)
Annual Wellness Visit  Patient: Wendy Barnes, Female    DOB: [birth year], 82 y.o.   MRN: [id number] Visit Date: 08/13/2023  Today's Provider: Jacky Kindle, FNP  Re-Introduced to nurse practitioner role and practice setting.  All questions answered.  Discussed provider/patient relationship and expectations.  Chief Complaint  Patient presents with   Annual Exam   Subjective    Wendy Barnes is a 82 y.o. female who presents today for her Annual Wellness Visit. She reports consuming a general diet. The patient does not participate in regular exercise at present. She generally feels well. She reports sleeping well. She does have additional problems to discuss today.   HPI  Medications: Outpatient Medications Prior to Visit  Medication Sig   amLODipine (NORVASC) 10 MG tablet Take 1 tablet (10 mg total) by mouth daily.   benazepril-hydrochlorthiazide (LOTENSIN HCT) 20-12.5 MG tablet TAKE ONE TABLET (20-12.5 MG) BY MOUTH TWICE DAILY   Biotin 2500 MCG CAPS Take 2,500 mcg by mouth daily.   Coenzyme Q10 100 MG capsule Take by mouth.   Cranberry-Vitamin C-Vitamin E (CRANBERRY PLUS VITAMIN C) 4200-20-3 MG-MG-UNIT CAPS Take 1 capsule by mouth daily.   gabapentin (NEURONTIN) 100 MG capsule TAKE 1-3 CAPSULES PRIOR TO BED FOR NEUROPATHY   levothyroxine (SYNTHROID) 88 MCG tablet TAKE 1 TABLET EVERY DAY ON EMPTY STOMACHWITH A GLASS OF WATER AT LEAST 30-60 MINBEFORE BREAKFAST   metFORMIN (GLUCOPHAGE) 500 MG tablet TAKE ONE TABLET (500 MG) BY MOUTH EVERY MORNING WITH BREAKFAST   Omega-3 Fatty Acids (FISH OIL ULTRA) 1400 MG CAPS Take 1,400 mg by mouth daily.   oxybutynin (DITROPAN-XL) 10 MG 24 hr tablet Take 1 tablet (10 mg total) by mouth at bedtime.   rosuvastatin (CRESTOR) 5 MG tablet Take 1 tablet (5 mg total) by mouth daily.   traZODone (DESYREL) 50 MG tablet Take 1 tablet (50 mg total) by mouth at bedtime.   Vibegron (GEMTESA) 75 MG TABS Take 75 mg by mouth daily.   [DISCONTINUED] Calcium  Carbonate-Vitamin D 600-10 MG-MCG TABS Take by mouth.   No facility-administered medications prior to visit.    Allergies  Allergen Reactions   Amoxicillin Hives, Itching and Rash    Has patient had a PCN reaction causing immediate rash, facial/tongue/throat swelling, SOB or lightheadedness with hypotension: Yes Has patient had a PCN reaction causing severe rash involving mucus membranes or skin necrosis: Yes Has patient had a PCN reaction that required hospitalization: No Has patient had a PCN reaction occurring within the last 10 years: Yes If all of the above answers are "NO", then may proceed with Cephalosporin use.    Lovastatin Other (See Comments)    myalgias   Penicillins Hives and Rash   Pravastatin Other (See Comments)    Myalgias    Patient Care Team: Jacky Kindle, FNP as PCP - General (Family Medicine) Galen Manila, MD as Referring Physician (Ophthalmology) Deirdre Evener, MD as Consulting Physician (Dermatology)  Review of Systems  Last [facility] Lab Results  Component Value Date   WBC 9.8 05/21/2021   HGB 13.4 05/21/2021   HCT 40.4 05/21/2021   MCV 86 05/21/2021   MCH 28.4 05/21/2021   RDW 13.2 05/21/2021   PLT 248 05/21/2021   Last metabolic panel Lab Results  Component Value Date   GLUCOSE 135 (H) 01/29/2023   NA 141 01/29/2023   K 4.2 01/29/2023   CL 102 01/29/2023   CO2 23 01/29/2023   BUN 9 01/29/2023   CREATININE 0.89  01/29/2023   EGFR 66 01/29/2023   CALCIUM 10.2 01/29/2023   PROT 6.6 01/29/2023   ALBUMIN 4.4 01/29/2023   LABGLOB 2.2 01/29/2023   AGRATIO 2.0 01/29/2023   BILITOT 0.4 01/29/2023   ALKPHOS 97 01/29/2023   AST 25 01/29/2023   ALT 25 01/29/2023   ANIONGAP 6 (L) 02/22/2014   Last lipids Lab Results  Component Value Date   CHOL 159 01/29/2023   HDL 59 01/29/2023   LDLCALC 86 01/29/2023   TRIG 74 01/29/2023   CHOLHDL 2.7 01/29/2023   Last hemoglobin A1c Lab Results  Component Value Date   HGBA1C 6.3 (A)  01/28/2023   Last thyroid functions Lab Results  Component Value Date   TSH 2.660 01/29/2023   Last vitamin D No results found for: "25OHVITD2", "25OHVITD3", "VD25OH"     Objective    Vitals: BP 136/65 (BP Location: Left Arm, Patient Position: Sitting, Cuff Size: Large)   Pulse 60   Temp 97.6 F (36.4 C) (Temporal)   Resp 16   Ht 5\' 8"  (1.727 m)   Wt 224 lb (101.6 kg)   SpO2 99%   BMI 34.06 kg/m   Physical Exam Vitals and nursing note reviewed.  Constitutional:      General: She is awake. She is not in acute distress.    Appearance: Normal appearance. She is well-developed and well-groomed. She is obese. She is not ill-appearing, toxic-appearing or diaphoretic.  HENT:     Head: Normocephalic and atraumatic.     Jaw: There is normal jaw occlusion. No trismus, tenderness, swelling or pain on movement.     Right Ear: Hearing, tympanic membrane, ear canal and external ear normal. There is no impacted cerumen.     Left Ear: Hearing, tympanic membrane, ear canal and external ear normal. There is no impacted cerumen.     Nose: Nose normal. No congestion or rhinorrhea.     Right Turbinates: Not enlarged, swollen or pale.     Left Turbinates: Not enlarged, swollen or pale.     Right Sinus: No maxillary sinus tenderness or frontal sinus tenderness.     Left Sinus: No maxillary sinus tenderness or frontal sinus tenderness.     Mouth/Throat:     Lips: Pink.     Mouth: Mucous membranes are moist. No injury.     Tongue: No lesions.     Pharynx: Oropharynx is clear. Uvula midline. No pharyngeal swelling, oropharyngeal exudate, posterior oropharyngeal erythema or uvula swelling.     Tonsils: No tonsillar exudate or tonsillar abscesses.  Eyes:     General: Lids are normal. Lids are everted, no foreign bodies appreciated. Vision grossly intact. Gaze aligned appropriately. No allergic shiner or visual field deficit.       Right eye: No discharge.        Left eye: No discharge.      Extraocular Movements: Extraocular movements intact.     Conjunctiva/sclera: Conjunctivae normal.     Right eye: Right conjunctiva is not injected. No exudate.    Left eye: Left conjunctiva is not injected. No exudate.    Pupils: Pupils are equal, round, and reactive to light.  Neck:     Thyroid: No thyroid mass, thyromegaly or thyroid tenderness.     Vascular: No carotid bruit.     Trachea: Trachea normal.  Cardiovascular:     Rate and Rhythm: Normal rate and regular rhythm.     Pulses: Normal pulses.          Carotid  pulses are 2+ on the right side and 2+ on the left side.      Radial pulses are 2+ on the right side and 2+ on the left side.       Dorsalis pedis pulses are 2+ on the right side and 2+ on the left side.       Posterior tibial pulses are 2+ on the right side and 2+ on the left side.     Heart sounds: Normal heart sounds, S1 normal and S2 normal. No murmur heard.    No friction rub. No gallop.  Pulmonary:     Effort: Pulmonary effort is normal. No respiratory distress.     Breath sounds: Normal breath sounds and air entry. No stridor. No wheezing, rhonchi or rales.  Chest:     Chest wall: No tenderness.  Abdominal:     General: Abdomen is flat. Bowel sounds are normal. There is no distension.     Palpations: Abdomen is soft. There is no mass.     Tenderness: There is no abdominal tenderness. There is no right CVA tenderness, left CVA tenderness, guarding or rebound.     Hernia: No hernia is present.  Genitourinary:    Comments: Exam deferred; denies complaints Musculoskeletal:        General: Swelling present. No tenderness, deformity or signs of injury. Normal range of motion.     Cervical back: Full passive range of motion without pain, normal range of motion and neck supple. No edema, rigidity or tenderness. No muscular tenderness.     Right lower leg: 2+ Pitting Edema present.     Left lower leg: 2+ Pitting Edema present.  Lymphadenopathy:     Cervical: No  cervical adenopathy.     Right cervical: No superficial, deep or posterior cervical adenopathy.    Left cervical: No superficial, deep or posterior cervical adenopathy.  Skin:    General: Skin is warm and dry.     Capillary Refill: Capillary refill takes less than 2 seconds.     Coloration: Skin is not jaundiced or pale.     Findings: No bruising, erythema, lesion or rash.  Neurological:     General: No focal deficit present.     Mental Status: She is alert and oriented to person, place, and time. Mental status is at baseline.     GCS: GCS eye subscore is 4. GCS verbal subscore is 5. GCS motor subscore is 6.     Sensory: Sensation is intact. No sensory deficit.     Motor: Motor function is intact. No weakness.     Coordination: Coordination is intact. Coordination normal.     Gait: Gait is intact. Gait normal.  Psychiatric:        Attention and Perception: Attention and perception normal.        Mood and Affect: Mood and affect normal.        Speech: Speech normal.        Behavior: Behavior normal. Behavior is cooperative.        Thought Content: Thought content normal.        Cognition and Memory: Cognition and memory normal.        Judgment: Judgment normal.    Most recent functional status assessment:    03/04/2023   10:07 AM  In your present state of health, do you have any difficulty performing the following activities:  Hearing? 0  Vision? 0  Difficulty concentrating or making decisions? 0  Walking or climbing stairs?  0  Dressing or bathing? 0  Doing errands, shopping? 0   Most recent fall risk assessment:    08/13/2023    8:42 AM  Fall Risk   Falls in the past year? 0  Number falls in past yr: 0  Injury with Fall? 0  Risk for fall due to : No Fall Risks  Follow up Falls evaluation completed    Most recent depression screenings:    08/13/2023    8:42 AM 03/04/2023   10:06 AM  PHQ 2/9 Scores  PHQ - 2 Score 0 0  PHQ- 9 Score 0 0   Most recent cognitive  screening:    08/05/2022    9:10 AM  6CIT Screen  What Year? 0 points  What month? 0 points  What time? 0 points  Count back from 20 0 points  Months in reverse 0 points  Repeat phrase 2 points  Total Score 2 points   Most recent Audit-C alcohol use screening    03/04/2023   10:07 AM  Alcohol Use Disorder Test (AUDIT)  1. How often do you have a drink containing alcohol? 0   A score of 3 or more in women, and 4 or more in men indicates increased risk for alcohol abuse, EXCEPT if all of the points are from question 1   No results found for any visits on 08/13/23.  Assessment & Plan     Annual wellness visit done today including the all of the following: Reviewed patient's Family Medical History Reviewed and updated list of patient's medical providers Assessment of cognitive impairment was done Assessed patient's functional ability Established a written schedule for health screening services Health Risk Assessent Completed and Reviewed  Exercise Activities and Dietary recommendations  Goals      DIET - EAT MORE FRUITS AND VEGETABLES     DIET - REDUCE FAT INTAKE     Recommend cutting out all fried foods to help aid in weight loss.      Reduce portion size     Recommend to work on portion control for daily meals.         Immunization History  Administered Date(s) Administered   Fluad Quad(high Dose 65+) 08/23/2019, 08/29/2020   Fluad Trivalent(High Dose 65+) 08/13/2023   Influenza Inj Mdck Quad Pf 09/30/2022   Influenza, High Dose Seasonal PF 10/16/2015, 08/20/2016, 09/28/2017, 10/16/2018   Influenza-Unspecified 09/13/2021   PFIZER(Purple Top)SARS-COV-2 Vaccination 01/16/2020, 02/06/2020, 09/17/2020, 07/25/2022   Pneumococcal Conjugate-13 10/30/2014   Pneumococcal Polysaccharide-23 01/02/2010   Zoster, Live 02/08/2009    Health Maintenance  Topic Date Due   DTaP/Tdap/Td (1 - Tdap) Never done   Zoster Vaccines- Shingrix (1 of 2) 09/03/1962   COVID-19 Vaccine  (5 - 2023-24 season) 09/19/2022   HEMOGLOBIN A1C  07/29/2023   Diabetic kidney evaluation - eGFR measurement  01/30/2024   Diabetic kidney evaluation - Urine ACR  01/30/2024   FOOT EXAM  03/02/2024   OPHTHALMOLOGY EXAM  07/29/2024   Medicare Annual Wellness (AWV)  08/12/2024   Pneumonia Vaccine 77+ Years old  Completed   INFLUENZA VACCINE  Completed   DEXA SCAN  Completed   Hepatitis C Screening  Completed   HPV VACCINES  Aged Out   Colonoscopy  Discontinued     Discussed health benefits of physical activity, and encouraged her to engage in regular exercise appropriate for her age and condition.    Problem List Items Addressed This Visit       Cardiovascular and Mediastinum  Hypertension associated with diabetes (HCC)    Chronic, borderline Goal of 129/79 Complaints of LE edema Continue to recommend reduced sodium diet and exercise Repeat CBC, CMP, TSH Continues on norvasc 10 mg which may be contributing to the edema as well as lotensin hct 20-12.5      Relevant Medications   tirzepatide (MOUNJARO) 2.5 MG/0.5ML Pen   Other Relevant Orders   Magnesium   CBC with Differential/Platelet   Comprehensive metabolic panel     Endocrine   Hyperlipidemia associated with type 2 diabetes mellitus (HCC)    Chronic, now on crestor 5 mg and tolerating well with addition of CoQ 10 OTC Repeat LP The 10-year ASCVD risk score (Arnett DK, et al., 2019) is: 56.8% I continue to recommend diet low in saturated fat and regular exercise - 30 min at least 5 times per week LDL goal recommended of <70      Relevant Medications   tirzepatide (MOUNJARO) 2.5 MG/0.5ML Pen   Other Relevant Orders   Lipid panel   Hypothyroidism    Chronic, stable Continues on 88 mcg synthroid Pt reports weight gain and abdominal fullness; repeat TSH      Relevant Orders   TSH   Type 2 diabetes mellitus with diabetic polyneuropathy, without long-term current use of insulin (HCC)    Chronic, stable Repeat  A1c; urine micro UTD Pt request to start mounjaro to assist with DM control as well as weight gain Remains on 100-300 mg at bedtime gabapentin to assist with neuropathy Also continues on metformin 500 mg once daily       Relevant Medications   tirzepatide (MOUNJARO) 2.5 MG/0.5ML Pen   Other Relevant Orders   Hemoglobin A1c     Nervous and Auditory   Osteoarthritis of spine with radiculopathy, lumbar region    Chronic, stable Repeat Vit D to further assist with overall bone health Continue to recommend calcium supplementation and walking       Relevant Orders   VITAMIN D 25 Hydroxy (Vit-D Deficiency, Fractures)     Genitourinary   Stage 3 chronic kidney disease (HCC)   Relevant Orders   VITAMIN D 25 Hydroxy (Vit-D Deficiency, Fractures)     Other   Ankle edema, bilateral    Acute on chronic, worsening No signs of poor venous or arterial flow Continue to recommend labs and then can trial changing antihypertensive medications to assist       Relevant Orders   Magnesium   Encounter for immunization   Relevant Orders   Flu Vaccine Trivalent High Dose (Fluad) (Completed)   Encounter for subsequent annual wellness visit (AWV) in Medicare patient - Primary    No concerns; reports plan to go to [place] for 82th birthday Things to do to keep yourself healthy  - Exercise at least 30-45 minutes a day, 3-4 days a week.  - Eat a low-fat diet with lots of fruits and vegetables, up to 7-9 servings per day.  - Seatbelts can save your life. Wear them always.  - Smoke detectors on every level of your home, check batteries every year.  - Eye Doctor - have an eye exam every 1-2 years  - Safe sex - if you may be exposed to STDs, use a condom.  - Alcohol -  If you drink, do it moderately, less than 2 drinks per day.  - Health Care Power of Attorney. Choose someone to speak for you if you are not able.  - Depression is common in our stressful  world.If you're feeling down or losing interest  in things you normally enjoy, please come in for a visit.  - Violence - If anyone is threatening or hurting you, please call immediately.       Return in about 3 months (around 11/13/2023) for chonic disease management.    Leilani Merl, FNP, have reviewed all documentation for this visit. The documentation on 08/13/23 for the exam, diagnosis, procedures, and orders are all accurate and complete.  Jacky Kindle, FNP  [facility] [phone] (phone) [phone] (fax)  [facility]

## 2023-08-13 NOTE — Assessment & Plan Note (Signed)
Chronic, stable Continues on 88 mcg synthroid Pt reports weight gain and abdominal fullness; repeat TSH

## 2023-08-13 NOTE — Assessment & Plan Note (Addendum)
Chronic, stable Repeat A1c; urine micro UTD Pt request to start mounjaro to assist with DM control as well as weight gain Remains on 100-300 mg at bedtime gabapentin to assist with neuropathy Also continues on metformin 500 mg once daily

## 2023-08-13 NOTE — Telephone Encounter (Signed)
Pt stated her insurance is not covering the medication tirzepatide (MOUNJARO) 2.5 MG/0.5ML Pen. It would cost her over $300.00. Pt is asking for an alternative that may be more affordable and covered by insurance.  Please advise.

## 2023-08-13 NOTE — Telephone Encounter (Signed)
Patient advised.

## 2023-08-14 LAB — COMPREHENSIVE METABOLIC PANEL
ALT: 16 IU/L (ref 0–32)
AST: 17 IU/L (ref 0–40)
Albumin: 4.2 g/dL (ref 3.8–4.8)
Alkaline Phosphatase: 95 IU/L (ref 44–121)
BUN/Creatinine Ratio: 14 (ref 12–28)
BUN: 10 mg/dL (ref 8–27)
Bilirubin Total: 0.5 mg/dL (ref 0.0–1.2)
CO2: 24 mmol/L (ref 20–29)
Calcium: 10 mg/dL (ref 8.7–10.3)
Chloride: 105 mmol/L (ref 96–106)
Creatinine, Ser: 0.74 mg/dL (ref 0.57–1.00)
Globulin, Total: 2.4 g/dL (ref 1.5–4.5)
Glucose: 149 mg/dL — ABNORMAL HIGH (ref 70–99)
Potassium: 3.4 mmol/L — ABNORMAL LOW (ref 3.5–5.2)
Sodium: 143 mmol/L (ref 134–144)
Total Protein: 6.6 g/dL (ref 6.0–8.5)
eGFR: 82 mL/min/{1.73_m2} (ref >59)

## 2023-08-14 LAB — TSH: TSH: 2.58 u[IU]/mL (ref 0.450–4.500)

## 2023-08-14 LAB — CBC WITH DIFFERENTIAL/PLATELET
Basophils Absolute: 0.1 10*3/uL (ref 0.0–0.2)
Basos: 1 %
EOS (ABSOLUTE): 0.2 10*3/uL (ref 0.0–0.4)
Eos: 2 %
Hematocrit: 39.5 % (ref 34.0–46.6)
Hemoglobin: 12.8 g/dL (ref 11.1–15.9)
Immature Grans (Abs): 0 10*3/uL (ref 0.0–0.1)
Immature Granulocytes: 1 %
Lymphocytes Absolute: 2.2 10*3/uL (ref 0.7–3.1)
Lymphs: 27 %
MCH: 28.8 pg (ref 26.6–33.0)
MCHC: 32.4 g/dL (ref 31.5–35.7)
MCV: 89 fL (ref 79–97)
Monocytes Absolute: 0.4 10*3/uL (ref 0.1–0.9)
Monocytes: 5 %
Neutrophils Absolute: 5.2 10*3/uL (ref 1.4–7.0)
Neutrophils: 64 %
Platelets: 198 10*3/uL (ref 150–450)
RBC: 4.45 x10E6/uL (ref 3.77–5.28)
RDW: 13.2 % (ref 11.7–15.4)
WBC: 8 10*3/uL (ref 3.4–10.8)

## 2023-08-14 LAB — LIPID PANEL
Chol/HDL Ratio: 2.8 ratio (ref 0.0–4.4)
Cholesterol, Total: 167 mg/dL (ref 100–199)
HDL: 59 mg/dL (ref >39)
LDL Chol Calc (NIH): 86 mg/dL (ref 0–99)
Triglycerides: 123 mg/dL (ref 0–149)
VLDL Cholesterol Cal: 22 mg/dL (ref 5–40)

## 2023-08-14 LAB — HEMOGLOBIN A1C
Est. average glucose Bld gHb Est-mCnc: 137 mg/dL
Hgb A1c MFr Bld: 6.4 % — ABNORMAL HIGH (ref 4.8–5.6)

## 2023-08-14 LAB — MAGNESIUM: Magnesium: 1.8 mg/dL (ref 1.6–2.3)

## 2023-08-14 LAB — VITAMIN D 25 HYDROXY (VIT D DEFICIENCY, FRACTURES): Vit D, 25-Hydroxy: 43.7 ng/mL (ref 30.0–100.0)

## 2023-09-15 ENCOUNTER — Other Ambulatory Visit: Payer: Self-pay

## 2023-09-15 ENCOUNTER — Telehealth: Payer: Self-pay

## 2023-09-15 DIAGNOSIS — N3946 Mixed incontinence: Secondary | ICD-10-CM

## 2023-09-15 MED ORDER — GEMTESA 75 MG PO TABS
75.0000 mg | ORAL_TABLET | Freq: Every day | ORAL | 0 refills | Status: DC
Start: 2023-09-15 — End: 2024-01-27

## 2023-09-15 NOTE — Telephone Encounter (Signed)
Pt calls triage line and states that she would like samples of Gemtesa. Per last office note ok to continue to provide, can you place samples up front for patient and call her when ready for pick up. Thanks!

## 2023-09-15 NOTE — Telephone Encounter (Signed)
Pt was informed to pick up gemtesa samples in our office, pt voiced understanding.

## 2023-09-18 ENCOUNTER — Other Ambulatory Visit: Payer: Self-pay | Admitting: Family Medicine

## 2023-09-18 DIAGNOSIS — E1142 Type 2 diabetes mellitus with diabetic polyneuropathy: Secondary | ICD-10-CM

## 2023-09-18 NOTE — Telephone Encounter (Signed)
Requested Prescriptions  Pending Prescriptions Disp Refills   metFORMIN (GLUCOPHAGE) 500 MG tablet [Pharmacy Med Name: METFORMIN HCL 500 MG TAB] 90 tablet 0    Sig: TAKE ONE TABLET (500 MG) BY MOUTH EVERY MORNING WITH BREAKFAST     Endocrinology:  Diabetes - Biguanides Failed - 09/18/2023 11:01 AM      Failed - B12 Level in normal range and within 720 days    No results found for: "VITAMINB12"       Passed - Cr in normal range and within 360 days    Creatinine  Date Value Ref Range Status  02/22/2014 0.58 (L) 0.60 - 1.30 mg/dL Final   Creatinine, Ser  Date Value Ref Range Status  08/13/2023 0.74 0.57 - 1.00 mg/dL Final         Passed - HBA1C is between 0 and 7.9 and within 180 days    Hgb A1c MFr Bld  Date Value Ref Range Status  08/13/2023 6.4 (H) 4.8 - 5.6 % Final    Comment:             Prediabetes: 5.7 - 6.4          Diabetes: >6.4          Glycemic control for adults with diabetes: <7.0          Passed - eGFR in normal range and within 360 days    EGFR (African American)  Date Value Ref Range Status  02/22/2014 >60  Final   GFR calc Af Amer  Date Value Ref Range Status  01/02/2021 64 >59 mL/min/1.73 Final    Comment:    **In accordance with recommendations from the NKF-ASN Task force,**   [facility] is in the process of updating its eGFR calculation to the   2021 CKD-EPI creatinine equation that estimates kidney function   without a race variable.    EGFR (Non-African Amer.)  Date Value Ref Range Status  02/22/2014 >60  Final    Comment:    eGFR values <5mL/min/1.73 m2 may be an indication of chronic kidney disease (CKD). Calculated eGFR is useful in patients with stable renal function. The eGFR calculation will not be reliable in acutely ill patients when serum creatinine is changing rapidly. It is not useful in  patients on dialysis. The eGFR calculation may not be applicable to patients at the low and high extremes of body sizes, pregnant women, and  vegetarians.    GFR calc non Af Amer  Date Value Ref Range Status  01/02/2021 55 (L) >59 mL/min/1.73 Final   eGFR  Date Value Ref Range Status  08/13/2023 82 >59 mL/min/1.73 Final         Passed - Valid encounter within last 6 months    Recent Outpatient Visits           1 month ago Encounter for subsequent annual wellness visit (AWV) in Medicare patient   [facility] Merita Norton T, FNP   6 months ago Hypertension associated with diabetes [facility])   [place] [facility] Merita Norton T, FNP   7 months ago Type 2 diabetes mellitus with diabetic polyneuropathy, without long-term current use of insulin [facility])   [place] [facility] Merita Norton T, FNP   1 year ago Type 2 diabetes mellitus with diabetic polyneuropathy, without long-term current use of insulin [facility])   [place] [facility] Merita Norton T, FNP   1 year ago Blood blister   [place]  [facility] Merita Norton T, FNP       Future Appointments             In 5 months Carman Ching, PA-C [facility] [place]   In 11 months Jacky Kindle, FNP [facility]   In 11 months Deirdre Evener, MD [facility]            Passed - CBC within normal limits and completed in the last 12 months    WBC  Date Value Ref Range Status  08/13/2023 8.0 3.4 - 10.8 x10E3/uL Final  02/08/2014 8.7 3.6 - 11.0 x10 3/mm 3 Final   RBC  Date Value Ref Range Status  08/13/2023 4.45 3.77 - 5.28 x10E6/uL Final  02/08/2014 4.33 3.80 - 5.20 X10 6/mm 3 Final   Hemoglobin  Date Value Ref Range Status  08/13/2023 12.8 11.1 - 15.9 g/dL Final   Hematocrit  Date Value Ref Range Status  08/13/2023 39.5 34.0 - 46.6 % Final   MCHC  Date Value Ref Range Status  08/13/2023 32.4 31.5 - 35.7 g/dL Final  44/12/270 53.6 32.0 - 36.0 g/dL Final   [facility]  Date Value Ref Range Status   08/13/2023 28.8 26.6 - 33.0 pg Final  02/08/2014 30.2 26.0 - 34.0 pg Final   MCV  Date Value Ref Range Status  08/13/2023 89 79 - 97 fL Final  02/08/2014 88 80 - 100 fL Final   No results found for: "PLTCOUNTKUC", "LABPLAT", "POCPLA" RDW  Date Value Ref Range Status  08/13/2023 13.2 11.7 - 15.4 % Final  02/08/2014 14.1 11.5 - 14.5 % Final

## 2023-10-06 ENCOUNTER — Other Ambulatory Visit: Payer: Self-pay | Admitting: Family Medicine

## 2023-10-06 DIAGNOSIS — E034 Atrophy of thyroid (acquired): Secondary | ICD-10-CM

## 2023-10-06 DIAGNOSIS — E1142 Type 2 diabetes mellitus with diabetic polyneuropathy: Secondary | ICD-10-CM

## 2023-10-21 ENCOUNTER — Ambulatory Visit: Payer: Self-pay | Admitting: *Deleted

## 2023-10-21 ENCOUNTER — Ambulatory Visit: Payer: Medicare Other | Admitting: Family Medicine

## 2023-10-21 ENCOUNTER — Encounter: Payer: Self-pay | Admitting: Family Medicine

## 2023-10-21 VITALS — BP 114/60 | HR 68 | Ht 65.0 in | Wt 223.0 lb

## 2023-10-21 DIAGNOSIS — J159 Unspecified bacterial pneumonia: Secondary | ICD-10-CM | POA: Diagnosis not present

## 2023-10-21 MED ORDER — LEVOCETIRIZINE DIHYDROCHLORIDE 5 MG PO TABS: 5.0000 mg | ORAL_TABLET | Freq: Every evening | ORAL | 11 refills | Status: DC

## 2023-10-21 MED ORDER — AZELASTINE HCL 0.1 % NA SOLN: 2.0000 | Freq: Two times a day (BID) | NASAL | 12 refills | Status: DC

## 2023-10-21 MED ORDER — AZITHROMYCIN 250 MG PO TABS: ORAL_TABLET | ORAL | 0 refills | Status: AC

## 2023-10-21 MED ORDER — DOXYCYCLINE HYCLATE 100 MG PO TABS: 100.0000 mg | ORAL_TABLET | Freq: Two times a day (BID) | ORAL | 0 refills | Status: DC

## 2023-10-21 NOTE — Progress Notes (Signed)
Established patient visit   Patient: Wendy Barnes   DOB: [birth year]   82 y.o. Female  MRN: [id number] Visit Date: 10/21/2023  Today's healthcare provider: Jacky Kindle, FNP  Introduced to nurse practitioner role and practice setting.  All questions answered.  Discussed provider/patient relationship and expectations.  Chief Complaint  Patient presents with   Cough    Associated with ear fullness & occasional wheeze X 1 week.  Reports it is a dry cough and fullness is bilateral. Patient reports she has been using chloraseptic max sore throat lozenges and cough drops. No fever to reports and feeling good overall outside of cough   Subjective    Cough   HPI     Cough    Additional comments: Associated with ear fullness & occasional wheeze X 1 week.  Reports it is a dry cough and fullness is bilateral. Patient reports she has been using chloraseptic max sore throat lozenges and cough drops. No fever to reports and feeling good overall outside of cough      Last edited by Acey Lav, CMA on 10/21/2023  1:04 PM.      Medications: Outpatient Medications Prior to Visit  Medication Sig   amLODipine (NORVASC) 10 MG tablet Take 1 tablet (10 mg total) by mouth daily.   benazepril-hydrochlorthiazide (LOTENSIN HCT) 20-12.5 MG tablet TAKE ONE TABLET (20-12.5 MG) BY MOUTH TWICE DAILY   Biotin 2500 MCG CAPS Take 2,500 mcg by mouth daily.   Coenzyme Q10 100 MG capsule Take by mouth.   Cranberry-Vitamin C-Vitamin E (CRANBERRY PLUS VITAMIN C) 4200-20-3 MG-MG-UNIT CAPS Take 1 capsule by mouth daily.   gabapentin (NEURONTIN) 100 MG capsule TAKE 1-3 CAPSULES PRIOR TO BED FOR NEUROPATHY   levothyroxine (SYNTHROID) 88 MCG tablet TAKE 1 TABLET EVERY DAY ON EMPTY STOMACHWITH A GLASS OF WATER AT LEAST 30-60 MINBEFORE BREAKFAST   metFORMIN (GLUCOPHAGE) 500 MG tablet TAKE ONE TABLET (500 MG) BY MOUTH EVERY MORNING WITH BREAKFAST   Omega-3 Fatty Acids (FISH OIL ULTRA) 1400 MG CAPS Take 1,400 mg  by mouth daily.   oxybutynin (DITROPAN-XL) 10 MG 24 hr tablet Take 1 tablet (10 mg total) by mouth at bedtime.   rosuvastatin (CRESTOR) 5 MG tablet Take 1 tablet (5 mg total) by mouth daily.   Semaglutide,0.25 or 0.5MG /DOS, 2 MG/1.5ML SOPN Inject 0.5 mg into the skin once a week.   traZODone (DESYREL) 50 MG tablet Take 1 tablet (50 mg total) by mouth at bedtime.   Vibegron (GEMTESA) 75 MG TABS Take 75 mg by mouth daily.   Vibegron (GEMTESA) 75 MG TABS Take 1 tablet (75 mg total) by mouth daily.   No facility-administered medications prior to visit.    Review of Systems  Respiratory:  Positive for cough.       Objective    BP 114/60 (BP Location: Right Arm, Patient Position: Sitting, Cuff Size: Large)   Pulse 68   Ht 5\' 5"  (1.651 m)   Wt 223 lb (101.2 kg)   SpO2 99%   BMI 37.11 kg/m   Physical Exam Vitals and nursing note reviewed.  Constitutional:      General: She is not in acute distress.    Appearance: Normal appearance. She is obese. She is ill-appearing. She is not toxic-appearing or diaphoretic.  HENT:     Head: Normocephalic and atraumatic.     Right Ear: Tympanic membrane, ear canal and external ear normal.     Left Ear: Tympanic membrane, ear canal and  external ear normal.     Nose: Nose normal.     Mouth/Throat:     Mouth: Mucous membranes are moist.     Pharynx: Oropharynx is clear. No oropharyngeal exudate or posterior oropharyngeal erythema.  Eyes:     Conjunctiva/sclera: Conjunctivae normal.  Cardiovascular:     Rate and Rhythm: Normal rate and regular rhythm.     Pulses: Normal pulses.     Heart sounds: Normal heart sounds. No murmur heard.    No friction rub. No gallop.  Pulmonary:     Effort: Pulmonary effort is normal. No respiratory distress.     Breath sounds: Decreased air movement and transmitted upper airway sounds present. No stridor. Examination of the right-middle field reveals wheezing. Examination of the right-lower field reveals wheezing and  rhonchi. Wheezing and rhonchi present. No rales.  Chest:     Chest wall: No tenderness.  Musculoskeletal:        General: No swelling, tenderness, deformity or signs of injury. Normal range of motion.     Right lower leg: No edema.     Left lower leg: No edema.  Skin:    General: Skin is warm and dry.     Capillary Refill: Capillary refill takes less than 2 seconds.     Coloration: Skin is not jaundiced or pale.     Findings: No bruising, erythema, lesion or rash.  Neurological:     General: No focal deficit present.     Mental Status: She is alert and oriented to person, place, and time. Mental status is at baseline.     Cranial Nerves: No cranial nerve deficit.     Sensory: No sensory deficit.     Motor: No weakness.     Coordination: Coordination normal.  Psychiatric:        Mood and Affect: Mood normal.        Behavior: Behavior normal.        Thought Content: Thought content normal.        Judgment: Judgment normal.     No results found for any visits on 10/21/23.  Assessment & Plan     Problem List Items Addressed This Visit       Respiratory   Community acquired bacterial pneumonia - Primary    Acute, failed conservative treatment Altered RLL lung sounds; recommend treatment Xray in 2 weeks following completion  Continue OTC/supportive care       Relevant Medications   azithromycin (ZITHROMAX) 250 MG tablet   doxycycline (VIBRA-TABS) 100 MG tablet   levocetirizine (XYZAL) 5 MG tablet   azelastine (ASTELIN) 0.1 % nasal spray   Other Relevant Orders   DG Chest 2 View   Return in about 5 months (around 03/20/2024), or if symptoms worsen or fail to improve, for chonic disease management.     Leilani Merl, FNP, have reviewed all documentation for this visit. The documentation on 10/21/23 for the exam, diagnosis, procedures, and orders are all accurate and complete.  Jacky Kindle, FNP  [facility] [phone] (phone) [phone]  (fax)  [facility]

## 2023-10-21 NOTE — Telephone Encounter (Signed)
  Chief Complaint: coughing spells, ear fullness occasional wheeze Symptoms: see above. Coughing spells, tessalon helps but cough not getting better . Now bilateral ears clogged. Left ear worse than right feels fullness hears echo.reports she can not take cough medication OTC  Frequency: cough x 1 week other sx couple of days  Pertinent Negatives: Patient denies chest pain no difficulty breathing no fever Disposition: [] ED /[] Urgent Care (no appt availability in office) / [x] Appointment(In office/virtual)/ []  [facility]/ [] Home Care/ [] Refused Recommended Disposition /[] [facility]/ []  Follow-up with PCP Additional Notes:   Appt scheduled today .     Reason for Disposition  SEVERE coughing spells (e.g., whooping sound after coughing, vomiting after coughing)  Answer Assessment - Initial Assessment Questions 1. ONSET: "When did the cough begin?"      1 week ago  2. SEVERITY: "How bad is the cough today?"      Coughing spells  3. SPUTUM: "Describe the color of your sputum" (none, dry cough; clear, white, yellow, green)     Dry  4. HEMOPTYSIS: "Are you coughing up any blood?" If so ask: "How much?" (flecks, streaks, tablespoons, etc.)     na 5. DIFFICULTY BREATHING: "Are you having difficulty breathing?" If Yes, ask: "How bad is it?" (e.g., mild, moderate, severe)    - MILD: No SOB at rest, mild SOB with walking, speaks normally in sentences, can lie down, no retractions, pulse < 100.    - MODERATE: SOB at rest, SOB with minimal exertion and prefers to sit, cannot lie down flat, speaks in phrases, mild retractions, audible wheezing, pulse 100-120.    - SEVERE: Very SOB at rest, speaks in single words, struggling to breathe, sitting hunched forward, retractions, pulse > 120      Coughing spells  6. FEVER: "Do you have a fever?" If Yes, ask: "What is your temperature, how was it measured, and when did it start?"     Na  7. CARDIAC HISTORY: "Do you have any history of  heart disease?" (e.g., heart attack, congestive heart failure)      Na  8. LUNG HISTORY: "Do you have any history of lung disease?"  (e.g., pulmonary embolus, asthma, emphysema)     na 9. PE RISK FACTORS: "Do you have a history of blood clots?" (or: recent major surgery, recent prolonged travel, bedridden)     na 10. OTHER SYMPTOMS: "Do you have any other symptoms?" (e.g., runny nose, wheezing, chest pain)       Dry cough left fullness "echoy" feels congestion,  11. PREGNANCY: "Is there any chance you are pregnant?" "When was your last menstrual period?"       na 12. TRAVEL: "Have you traveled out of the country in the last month?" (e.g., travel history, exposures)       na  Protocols used: Cough - Acute Non-Productive-A-AH

## 2023-10-21 NOTE — Assessment & Plan Note (Signed)
Acute, failed conservative treatment Altered RLL lung sounds; recommend treatment Xray in 2 weeks following completion  Continue OTC/supportive care

## 2023-10-21 NOTE — Patient Instructions (Signed)
Some things that can make you feel better are: - Increased rest - Increasing Fluids - Acetaminophen / ibuprofen as needed for fever/pain.  - Salt water gargling, chloraseptic spray and throat lozenges - OTC pseudoephedrine.  - Mucinex.  - Saline sinus flushes or a neti pot.  - Humidifying the air.  

## 2023-10-31 ENCOUNTER — Other Ambulatory Visit: Payer: Self-pay | Admitting: Family Medicine

## 2023-10-31 DIAGNOSIS — I152 Hypertension secondary to endocrine disorders: Secondary | ICD-10-CM

## 2023-11-02 NOTE — Telephone Encounter (Signed)
Requested medication (s) are due for refill today: yes   Requested medication (s) are on the active medication list: yes   Last refill:  04/29/23 #180 0 refills   Future visit scheduled: yes in 4 months  Notes to clinic:  protocol failed. Last labs 08/13/23. Do you want to refill Rx? Patient requesting refills.     Requested Prescriptions  Pending Prescriptions Disp Refills   benazepril-hydrochlorthiazide (LOTENSIN HCT) 20-12.5 MG tablet [Pharmacy Med Name: BENAZEPRIL-HCTZ 20-12.5 MG TAB] 180 tablet 0    Sig: TAKE ONE TABLET (20-12.5 MG) BY MOUTH TWICE DAILY     Cardiovascular:  ACEI + Diuretic Combos Failed - 10/31/2023  8:55 AM      Failed - K in normal range and within 180 days    Potassium  Date Value Ref Range Status  08/13/2023 3.4 (L) 3.5 - 5.2 mmol/L Final  02/22/2014 3.9 3.5 - 5.1 mmol/L Final         Passed - Na in normal range and within 180 days    Sodium  Date Value Ref Range Status  08/13/2023 143 134 - 144 mmol/L Final  02/22/2014 134 (L) 136 - 145 mmol/L Final         Passed - Cr in normal range and within 180 days    Creatinine  Date Value Ref Range Status  02/22/2014 0.58 (L) 0.60 - 1.30 mg/dL Final   Creatinine, Ser  Date Value Ref Range Status  08/13/2023 0.74 0.57 - 1.00 mg/dL Final         Passed - eGFR is 30 or above and within 180 days    EGFR (African American)  Date Value Ref Range Status  02/22/2014 >60  Final   GFR calc Af Amer  Date Value Ref Range Status  01/02/2021 64 >59 mL/min/1.73 Final    Comment:    **In accordance with recommendations from the NKF-ASN Task force,**   [facility] is in the process of updating its eGFR calculation to the   2021 CKD-EPI creatinine equation that estimates kidney function   without a race variable.    EGFR (Non-African Amer.)  Date Value Ref Range Status  02/22/2014 >60  Final    Comment:    eGFR values <62mL/min/1.73 m2 may be an indication of chronic kidney disease (CKD). Calculated eGFR is  useful in patients with stable renal function. The eGFR calculation will not be reliable in acutely ill patients when serum creatinine is changing rapidly. It is not useful in  patients on dialysis. The eGFR calculation may not be applicable to patients at the low and high extremes of body sizes, pregnant women, and vegetarians.    GFR calc non Af Amer  Date Value Ref Range Status  01/02/2021 55 (L) >59 mL/min/1.73 Final   eGFR  Date Value Ref Range Status  08/13/2023 82 >59 mL/min/1.73 Final         Passed - Patient is not pregnant      Passed - Last BP in normal range    BP Readings from Last 1 Encounters:  10/21/23 114/60         Passed - Valid encounter within last 6 months    Recent Outpatient Visits           1 week ago Community acquired bacterial pneumonia   [facility] Merita Norton T, FNP   2 months ago Encounter for subsequent annual wellness visit (AWV) in Medicare patient   [place] [facility]  Jacky Kindle, FNP   8 months ago Hypertension associated with diabetes [facility])   [facility] Merita Norton T, FNP   9 months ago Type 2 diabetes mellitus with diabetic polyneuropathy, without long-term current use of insulin [facility])   [facility] [facility] Merita Norton T, FNP   1 year ago Type 2 diabetes mellitus with diabetic polyneuropathy, without long-term current use of insulin [facility])   [place] [facility] Jacky Kindle, FNP       Future Appointments             In 4 months Carman Ching, PA-C [facility] [place]   In 4 months Jacky Kindle, FNP [facility], [facility]   In 9 months Jacky Kindle, FNP [facility], [facility]   In 9 months Deirdre Evener, MD [facility] [place]

## 2023-11-04 ENCOUNTER — Other Ambulatory Visit: Payer: Self-pay | Admitting: Family Medicine

## 2023-11-04 DIAGNOSIS — E1159 Type 2 diabetes mellitus with other circulatory complications: Secondary | ICD-10-CM

## 2023-11-04 MED ORDER — BENAZEPRIL-HYDROCHLOROTHIAZIDE 20-12.5 MG PO TABS: 1.0000 | ORAL_TABLET | Freq: Two times a day (BID) | ORAL | 0 refills | Status: DC

## 2023-11-04 NOTE — Telephone Encounter (Signed)
Requested Prescriptions  Pending Prescriptions Disp Refills   benazepril-hydrochlorthiazide (LOTENSIN HCT) 20-12.5 MG tablet 180 tablet 0    Sig: Take 1 tablet by mouth 2 (two) times daily.     Cardiovascular:  ACEI + Diuretic Combos Failed - 11/04/2023 10:18 AM      Failed - K in normal range and within 180 days    Potassium  Date Value Ref Range Status  08/13/2023 3.4 (L) 3.5 - 5.2 mmol/L Final  02/22/2014 3.9 3.5 - 5.1 mmol/L Final         Passed - Na in normal range and within 180 days    Sodium  Date Value Ref Range Status  08/13/2023 143 134 - 144 mmol/L Final  02/22/2014 134 (L) 136 - 145 mmol/L Final         Passed - Cr in normal range and within 180 days    Creatinine  Date Value Ref Range Status  02/22/2014 0.58 (L) 0.60 - 1.30 mg/dL Final   Creatinine, Ser  Date Value Ref Range Status  08/13/2023 0.74 0.57 - 1.00 mg/dL Final         Passed - eGFR is 30 or above and within 180 days    EGFR (African American)  Date Value Ref Range Status  02/22/2014 >60  Final   GFR calc Af Amer  Date Value Ref Range Status  01/02/2021 64 >59 mL/min/1.73 Final    Comment:    **In accordance with recommendations from the NKF-ASN Task force,**   [facility] is in the process of updating its eGFR calculation to the   2021 CKD-EPI creatinine equation that estimates kidney function   without a race variable.    EGFR (Non-African Amer.)  Date Value Ref Range Status  02/22/2014 >60  Final    Comment:    eGFR values <36mL/min/1.73 m2 may be an indication of chronic kidney disease (CKD). Calculated eGFR is useful in patients with stable renal function. The eGFR calculation will not be reliable in acutely ill patients when serum creatinine is changing rapidly. It is not useful in  patients on dialysis. The eGFR calculation may not be applicable to patients at the low and high extremes of body sizes, pregnant women, and vegetarians.    GFR calc non Af Amer  Date Value Ref Range  Status  01/02/2021 55 (L) >59 mL/min/1.73 Final   eGFR  Date Value Ref Range Status  08/13/2023 82 >59 mL/min/1.73 Final         Passed - Patient is not pregnant      Passed - Last BP in normal range    BP Readings from Last 1 Encounters:  10/21/23 114/60         Passed - Valid encounter within last 6 months    Recent Outpatient Visits           2 weeks ago Community acquired bacterial pneumonia   [facility] T, FNP   2 months ago Encounter for subsequent annual wellness visit (AWV) in Medicare patient   [facility] Merita Norton T, FNP   8 months ago Hypertension associated with diabetes [facility])   [place] [facility] Merita Norton T, FNP   9 months ago Type 2 diabetes mellitus with diabetic polyneuropathy, without long-term current use of insulin [facility])   [place] [facility] Merita Norton T, FNP   1 year ago Type 2 diabetes mellitus with diabetic polyneuropathy, without long-term current  use of insulin [facility])   [place] [facility] Jacky Kindle, FNP       Future Appointments             In 4 months Vaillancourt, Lelon Mast, PA-C [facility] [place]   In 4 months Jacky Kindle, FNP [facility], [facility]   In 9 months Jacky Kindle, FNP [facility], [facility]   In 9 months Deirdre Evener, MD [facility] [place] [facility]

## 2023-11-04 NOTE — Telephone Encounter (Signed)
Medication Refill -  Most Recent Primary Care Visit:  Provider: Merita Norton T  Department: [facility]  Visit Type: OFFICE VISIT  Date: 10/21/2023  Medication: benazepril-hydrochlorthiazide (LOTENSIN HCT) 20-12.5 MG tablet Pt is requesting phone call once prescription is sent in to pharmacy, [phone]  Has the patient contacted their pharmacy? Yes (Agent: If yes, when and what did the pharmacy advise?) [facility] did not have prescription, advised pt to reach out to office.   Is this the correct pharmacy for this prescription? Yes This is the patient's preferred pharmacy:  [id number] [place], [place] - [address] AT [facility] [address] [place] [address] Phone: [phone] Fax: [phone]  Has the prescription been filled recently? Yes  Is the patient out of the medication? No  Has the patient been seen for an appointment in the last year OR does the patient have an upcoming appointment? Yes  Can we respond through MyChart? Yes Pt prefer's phone call if needed: [phone]  Agent: Please be advised that Rx refills may take up to 3 business days. We ask that you follow-up with your pharmacy.

## 2023-11-07 ENCOUNTER — Other Ambulatory Visit: Payer: Self-pay | Admitting: Family Medicine

## 2023-11-07 DIAGNOSIS — I152 Hypertension secondary to endocrine disorders: Secondary | ICD-10-CM

## 2023-12-01 ENCOUNTER — Telehealth: Payer: Self-pay | Admitting: Physician Assistant

## 2023-12-01 NOTE — Telephone Encounter (Signed)
She called requesting more samples of Gemtesa. She said that she did not want a prescription for them due to that will cost her over $300. And that you told her that you would give her some more samples to pick up. Her MyChart is messed up so she would like someone to call her at home [phone] and let her know when she can come pick the samples up.

## 2023-12-01 NOTE — Telephone Encounter (Signed)
Samples are at the front desk with her name on them for pickup at her convenience.

## 2023-12-01 NOTE — Telephone Encounter (Signed)
Pt informed, voiced understanding

## 2023-12-03 ENCOUNTER — Other Ambulatory Visit: Payer: Self-pay | Admitting: Family Medicine

## 2023-12-03 DIAGNOSIS — E1142 Type 2 diabetes mellitus with diabetic polyneuropathy: Secondary | ICD-10-CM

## 2023-12-03 DIAGNOSIS — I152 Hypertension secondary to endocrine disorders: Secondary | ICD-10-CM

## 2023-12-03 NOTE — Telephone Encounter (Signed)
Medication Refill -  Most Recent Primary Care Visit:  Provider: Merita Norton T  Department: [facility]  Visit Type: OFFICE VISIT  Date: 10/21/2023  Medication: gabapentin (NEURONTIN) 100 MG capsule  Has the patient contacted their pharmacy? Yes (Agent: If yes, when and what did the pharmacy advise?) Out of refills, needs to contact the office.   Is this the correct pharmacy for this prescription? Yes This is the patient's preferred pharmacy:  [id number] [place], [place] - [address] AT [facility] [address] [place] [address] Phone: [phone] Fax: [phone]   Has the prescription been filled recently? No  Is the patient out of the medication?No.  Has the patient been seen for an appointment in the last year OR does the patient have an upcoming appointment? Yes  Can we respond through MyChart? Yes  Agent: Please be advised that Rx refills may take up to 3 business days. We ask that you follow-up with your pharmacy.

## 2023-12-03 NOTE — Telephone Encounter (Signed)
Requested medications are due for refill today.  yes  Requested medications are on the active medications list.  yes  Last refill. 10/07/2023 #90 0 rf  Future visit scheduled.   yes  Notes to clinic.  Please review for refill.    Requested Prescriptions  Pending Prescriptions Disp Refills   gabapentin (NEURONTIN) 100 MG capsule 90 capsule 0     Neurology: Anticonvulsants - gabapentin Passed - 12/03/2023 12:14 PM      Passed - Cr in normal range and within 360 days    Creatinine  Date Value Ref Range Status  02/22/2014 0.58 (L) 0.60 - 1.30 mg/dL Final   Creatinine, Ser  Date Value Ref Range Status  08/13/2023 0.74 0.57 - 1.00 mg/dL Final         Passed - Completed PHQ-2 or PHQ-9 in the last 360 days      Passed - Valid encounter within last 12 months    Recent Outpatient Visits           1 month ago Community acquired bacterial pneumonia   [place] Merita Norton T, FNP   3 months ago Encounter for subsequent annual wellness visit (AWV) in Medicare patient   [facility] Merita Norton T, FNP   9 months ago Hypertension associated with diabetes [facility])   [place] [facility] Merita Norton T, FNP   10 months ago Type 2 diabetes mellitus with diabetic polyneuropathy, without long-term current use of insulin [facility])   [place] [facility] Merita Norton T, FNP   1 year ago Type 2 diabetes mellitus with diabetic polyneuropathy, without long-term current use of insulin [facility])   [place] [facility] Jacky Kindle, FNP       Future Appointments             In 3 months Vaillancourt, Lelon Mast, PA-C [facility] [place]   In 3 months Ostwalt, [place], PA-C [facility], [facility]   In 8 months Ostwalt, [place], PA-C [employer], PEC   In 8 months Deirdre Evener, MD [facility] [place] [facility]

## 2023-12-03 NOTE — Telephone Encounter (Signed)
Requested Prescriptions  Pending Prescriptions Disp Refills   benazepril-hydrochlorthiazide (LOTENSIN HCT) 20-12.5 MG tablet [Pharmacy Med Name: BENAZEPRIL/HCTZ 20/12.5MG  TABLETS] 180 tablet 0    Sig: TAKE 1 TABLET BY MOUTH TWICE DAILY     Cardiovascular:  ACEI + Diuretic Combos Failed - 12/03/2023 12:10 PM      Failed - K in normal range and within 180 days    Potassium  Date Value Ref Range Status  08/13/2023 3.4 (L) 3.5 - 5.2 mmol/L Final  02/22/2014 3.9 3.5 - 5.1 mmol/L Final         Passed - Na in normal range and within 180 days    Sodium  Date Value Ref Range Status  08/13/2023 143 134 - 144 mmol/L Final  02/22/2014 134 (L) 136 - 145 mmol/L Final         Passed - Cr in normal range and within 180 days    Creatinine  Date Value Ref Range Status  02/22/2014 0.58 (L) 0.60 - 1.30 mg/dL Final   Creatinine, Ser  Date Value Ref Range Status  08/13/2023 0.74 0.57 - 1.00 mg/dL Final         Passed - eGFR is 30 or above and within 180 days    EGFR (African American)  Date Value Ref Range Status  02/22/2014 >60  Final   GFR calc Af Amer  Date Value Ref Range Status  01/02/2021 64 >59 mL/min/1.73 Final    Comment:    **In accordance with recommendations from the NKF-ASN Task force,**   [facility] is in the process of updating its eGFR calculation to the   2021 CKD-EPI creatinine equation that estimates kidney function   without a race variable.    EGFR (Non-African Amer.)  Date Value Ref Range Status  02/22/2014 >60  Final    Comment:    eGFR values <60mL/min/1.73 m2 may be an indication of chronic kidney disease (CKD). Calculated eGFR is useful in patients with stable renal function. The eGFR calculation will not be reliable in acutely ill patients when serum creatinine is changing rapidly. It is not useful in  patients on dialysis. The eGFR calculation may not be applicable to patients at the low and high extremes of body sizes, pregnant women, and vegetarians.     GFR calc non Af Amer  Date Value Ref Range Status  01/02/2021 55 (L) >59 mL/min/1.73 Final   eGFR  Date Value Ref Range Status  08/13/2023 82 >59 mL/min/1.73 Final         Passed - Patient is not pregnant      Passed - Last BP in normal range    BP Readings from Last 1 Encounters:  10/21/23 114/60         Passed - Valid encounter within last 6 months    Recent Outpatient Visits           1 month ago Community acquired bacterial pneumonia   [place] [facility] Merita Norton T, FNP   3 months ago Encounter for subsequent annual wellness visit (AWV) in Medicare patient   [facility] T, FNP   9 months ago Hypertension associated with diabetes [facility])   [place] [facility] Merita Norton T, FNP   10 months ago Type 2 diabetes mellitus with diabetic polyneuropathy, without long-term current use of insulin [facility])   [place] [facility] Merita Norton T, FNP   1 year ago Type 2 diabetes mellitus with diabetic  polyneuropathy, without long-term current use of insulin [facility])   [place] [facility] Jacky Kindle, FNP       Future Appointments             In 3 months Vaillancourt, Lelon Mast, PA-C [facility] [place]   In 3 months [address], [place], PA-C [facility]   In 8 months Debera Lat, PA-C [facility], [facility]   In 8 months Deirdre Evener, MD [facility]

## 2023-12-05 ENCOUNTER — Other Ambulatory Visit: Payer: Self-pay | Admitting: Family Medicine

## 2023-12-05 DIAGNOSIS — E1142 Type 2 diabetes mellitus with diabetic polyneuropathy: Secondary | ICD-10-CM

## 2023-12-07 ENCOUNTER — Other Ambulatory Visit: Payer: Self-pay | Admitting: Family Medicine

## 2023-12-07 DIAGNOSIS — E1142 Type 2 diabetes mellitus with diabetic polyneuropathy: Secondary | ICD-10-CM

## 2023-12-07 MED ORDER — GABAPENTIN 100 MG PO CAPS: ORAL_CAPSULE | ORAL | 1 refills | Status: DC

## 2023-12-07 NOTE — Telephone Encounter (Signed)
Requested Prescriptions  Pending Prescriptions Disp Refills   gabapentin (NEURONTIN) 100 MG capsule 270 capsule 1    Sig: TAKE 1-3 CAPSULES PRIOR TO BED FOR NEUROPATHY     Neurology: Anticonvulsants - gabapentin Passed - 12/07/2023 12:45 PM      Passed - Cr in normal range and within 360 days    Creatinine  Date Value Ref Range Status  02/22/2014 0.58 (L) 0.60 - 1.30 mg/dL Final   Creatinine, Ser  Date Value Ref Range Status  08/13/2023 0.74 0.57 - 1.00 mg/dL Final         Passed - Completed PHQ-2 or PHQ-9 in the last 360 days      Passed - Valid encounter within last 12 months    Recent Outpatient Visits           1 month ago Community acquired bacterial pneumonia   [place] [facility] Merita Norton T, FNP   3 months ago Encounter for subsequent annual wellness visit (AWV) in Medicare patient   [place] [facility] Norton T, FNP   9 months ago Hypertension associated with diabetes [facility])   [place] [facility] Norton T, FNP   10 months ago Type 2 diabetes mellitus with diabetic polyneuropathy, without long-term current use of insulin [facility])   [place] [facility] Merita Norton T, FNP   1 year ago Type 2 diabetes mellitus with diabetic polyneuropathy, without long-term current use of insulin [facility])   [facility] Jacky Kindle, FNP       Future Appointments             In 2 months Vaillancourt, Lelon Mast, PA-C [facility] [place]   In 3 months Ostwalt, [place], PA-C [place] [facility]   In 8 months Ostwalt, [place], PA-C [employer], [facility]   In 8 months Deirdre Evener, MD [facility]

## 2023-12-07 NOTE — Telephone Encounter (Signed)
Medication Refill -  Most Recent Primary Care Visit:  Provider: Merita Norton T  Department[facility]  Visit Type: OFFICE VISIT  Date: 10/21/2023  Medication: gabapentin (NEURONTIN) 100 MG capsule [[id number]]   Has the patient contacted their pharmacy? Yes (Agent: If no, request that the patient contact the pharmacy for the refill. If patient does not wish to contact the pharmacy document the reason why and proceed with request.) (Agent: If yes, when and what did the pharmacy advise?)  Is this the correct pharmacy for this prescription? Yes If no, delete pharmacy and type the correct one.  This is the patient's preferred pharmacy:  [id number] [place], [place] - [address] AT [facility] OF [address] [place] [address] Phone: [phone] Fax: [phone]   Has the prescription been filled recently? Yes  Is the patient out of the medication? No  Has the patient been seen for an appointment in the last year OR does the patient have an upcoming appointment? Yes  Can we respond through MyChart? No  Agent: Please be advised that Rx refills may take up to 3 business days. We ask that you follow-up with your pharmacy.

## 2023-12-07 NOTE — Telephone Encounter (Signed)
Requested Prescriptions  Pending Prescriptions Disp Refills   metFORMIN (GLUCOPHAGE) 500 MG tablet [Pharmacy Med Name: METFORMIN 500MG  TABLETS] 90 tablet 1    Sig: TAKE 1 TABLET BY MOUTH EVERY MORNING WITH BREAKFAST     Endocrinology:  Diabetes - Biguanides Failed - 12/07/2023 12:44 PM      Failed - B12 Level in normal range and within 720 days    No results found for: "VITAMINB12"       Passed - Cr in normal range and within 360 days    Creatinine  Date Value Ref Range Status  02/22/2014 0.58 (L) 0.60 - 1.30 mg/dL Final   Creatinine, Ser  Date Value Ref Range Status  08/13/2023 0.74 0.57 - 1.00 mg/dL Final         Passed - HBA1C is between 0 and 7.9 and within 180 days    Hgb A1c MFr Bld  Date Value Ref Range Status  08/13/2023 6.4 (H) 4.8 - 5.6 % Final    Comment:             Prediabetes: 5.7 - 6.4          Diabetes: >6.4          Glycemic control for adults with diabetes: <7.0          Passed - eGFR in normal range and within 360 days    EGFR (African American)  Date Value Ref Range Status  02/22/2014 >60  Final   GFR calc Af Amer  Date Value Ref Range Status  01/02/2021 64 >59 mL/min/1.73 Final    Comment:    **In accordance with recommendations from the NKF-ASN Task force,**   [facility] is in the process of updating its eGFR calculation to the   2021 CKD-EPI creatinine equation that estimates kidney function   without a race variable.    EGFR (Non-African Amer.)  Date Value Ref Range Status  02/22/2014 >60  Final    Comment:    eGFR values <4mL/min/1.73 m2 may be an indication of chronic kidney disease (CKD). Calculated eGFR is useful in patients with stable renal function. The eGFR calculation will not be reliable in acutely ill patients when serum creatinine is changing rapidly. It is not useful in  patients on dialysis. The eGFR calculation may not be applicable to patients at the low and high extremes of body sizes, pregnant women, and vegetarians.     GFR calc non Af Amer  Date Value Ref Range Status  01/02/2021 55 (L) >59 mL/min/1.73 Final   eGFR  Date Value Ref Range Status  08/13/2023 82 >59 mL/min/1.73 Final         Passed - Valid encounter within last 6 months    Recent Outpatient Visits           1 month ago Community acquired bacterial pneumonia   [place] [facility] T, FNP   3 months ago Encounter for subsequent annual wellness visit (AWV) in Medicare patient   [facility], FNP   9 months ago Hypertension associated with diabetes [facility])   [place] [facility] Mer[facility] Norton T, FNP   10 months ago Type 2 diabetes mellitus with diabetic polyneuropathy, without long-term current use of insulin [facility])   [place] [facility] Merita Norton T, FNP   1 year ago Type 2 diabetes mellitus with diabetic polyneuropathy, without long-term current use of insulin (HCC)    [facility] Jacky Kindle, FNP       Future Appointments             In 2 months Carman Ching, PA-C [facility] [place]   In 3 months [place], [place], PA-C [place] [facility]   In 8 months [place], [place], PA-C [place] [facility]   In 8 months Deirdre Evener, MD  [facility]            Passed - CBC within normal limits and completed in the last 12 months    WBC  Date Value Ref Range Status  08/13/2023 8.0 3.4 - 10.8 x10E3/uL Final  02/08/2014 8.7 3.6 - 11.0 x10 3/mm 3 Final   RBC  Date Value Ref Range Status  08/13/2023 4.45 3.77 - 5.28 x10E6/uL Final  02/08/2014 4.33 3.80 - 5.20 X10 6/mm 3 Final   Hemoglobin  Date Value Ref Range Status  08/13/2023 12.8 11.1 - 15.9 g/dL Final   Hematocrit  Date Value Ref Range Status  08/13/2023 39.5 34.0 - 46.6 % Final   MCHC  Date Value Ref Range Status  08/13/2023 32.4 31.5 - 35.7  g/dL Final  63/87/5643 32.9 32.0 - 36.0 g/dL Final   [facility]  Date Value Ref Range Status  08/13/2023 28.8 26.6 - 33.0 pg Final  02/08/2014 30.2 26.0 - 34.0 pg Final   MCV  Date Value Ref Range Status  08/13/2023 89 79 - 97 fL Final  02/08/2014 88 80 - 100 fL Final   No results found for: "PLTCOUNTKUC", "LABPLAT", "POCPLA" RDW  Date Value Ref Range Status  08/13/2023 13.2 11.7 - 15.4 % Final  02/08/2014 14.1 11.5 - 14.5 % Final

## 2023-12-18 ENCOUNTER — Other Ambulatory Visit: Payer: Self-pay | Admitting: Family Medicine

## 2023-12-18 DIAGNOSIS — E7800 Pure hypercholesterolemia, unspecified: Secondary | ICD-10-CM

## 2023-12-18 NOTE — Telephone Encounter (Signed)
 Medication Refill -  Most Recent Primary Care Visit:  Provider: PAYNE, ELISE T  Department: [facility]  Visit Type: OFFICE VISIT  Date: 10/21/2023  Medication: rosuvastatin  (CRESTOR ) 5 MG tablet [[id number]]   Medication was previously sent to a different pharmacy.   Has the patient contacted their pharmacy? Yes (Agent: If no, request that the patient contact the pharmacy for the refill. If patient does not wish to contact the pharmacy document the reason why and proceed with request.) (Agent: If yes, when and what did the pharmacy advise?)  Is this the correct pharmacy for this prescription? Yes If no, delete pharmacy and type the correct one.  This is the patient's preferred pharmacy:  [facility] #[id number] [place], [place] - [address] AT [facility] OF [address] [place] [address] Phone: [phone] Fax: [phone]   Has the prescription been filled recently? Yes  Is the patient out of the medication? Yes  Has the patient been seen for an appointment in the last year OR does the patient have an upcoming appointment? Yes  Can we respond through MyChart? Yes  Agent: Please be advised that Rx refills may take up to 3 business days. We ask that you follow-up with your pharmacy.

## 2023-12-22 MED ORDER — ROSUVASTATIN CALCIUM 5 MG PO TABS: 5.0000 mg | ORAL_TABLET | Freq: Every day | ORAL | 0 refills | Status: DC

## 2023-12-22 NOTE — Telephone Encounter (Signed)
 Requested by patient . Future visit in 1 week.  Requested Prescriptions  Pending Prescriptions Disp Refills   rosuvastatin  (CRESTOR ) 5 MG tablet 90 tablet 0    Sig: Take 1 tablet (5 mg total) by mouth daily.     Cardiovascular:  Antilipid - Statins 2 Failed - 12/22/2023  9:21 AM      Failed - Lipid Panel in normal range within the last 12 months    Cholesterol, Total  Date Value Ref Range Status  08/13/2023 167 100 - 199 mg/dL Final   LDL Chol Calc (NIH)  Date Value Ref Range Status  08/13/2023 86 0 - 99 mg/dL Final   HDL  Date Value Ref Range Status  08/13/2023 59 >39 mg/dL Final   Triglycerides  Date Value Ref Range Status  08/13/2023 123 0 - 149 mg/dL Final         Passed - Cr in normal range and within 360 days    Creatinine  Date Value Ref Range Status  02/22/2014 0.58 (L) 0.60 - 1.30 mg/dL Final   Creatinine, Ser  Date Value Ref Range Status  08/13/2023 0.74 0.57 - 1.00 mg/dL Final         Passed - Patient is not pregnant      Passed - Valid encounter within last 12 months    Recent Outpatient Visits           2 months ago Community acquired bacterial pneumonia   [place] [facility] Emilio Marseille T, FNP   4 months ago Encounter for subsequent annual wellness visit (AWV) in Medicare patient   [facility] T, FNP   9 months ago Hypertension associated with diabetes [facility])   [place] [facility] Emilio Marseille T, FNP   10 months ago Type 2 diabetes mellitus with diabetic polyneuropathy, without long-term current use of insulin [facility])   [place] [facility] Emilio Marseille T, FNP   1 year ago Type 2 diabetes mellitus with diabetic polyneuropathy, without long-term current use of insulin [facility])   [place] [facility] Emilio Marseille DASEN, FNP       Future Appointments             In 1 week Ostwalt, Janna, PA-C [place] [place]   In 2  months Vaillancourt, Samantha, PA-C [facility] [facility] [place]   In 3 months [place], Janna, PA-C [place] [facility]   In 7 months Ostwalt, Janna, PA-C [employer], [facility]   In 8 months Hester Alm BROCKS, MD [facility] [place] [facility]

## 2023-12-30 ENCOUNTER — Ambulatory Visit: Payer: Medicare Other | Admitting: Physician Assistant

## 2023-12-31 ENCOUNTER — Encounter: Payer: Self-pay | Admitting: Physician Assistant

## 2023-12-31 ENCOUNTER — Ambulatory Visit (INDEPENDENT_AMBULATORY_CARE_PROVIDER_SITE_OTHER): Payer: Medicare Other | Admitting: Physician Assistant

## 2023-12-31 VITALS — BP 136/68 | HR 69 | Resp 16 | Wt 229.0 lb

## 2023-12-31 DIAGNOSIS — E785 Hyperlipidemia, unspecified: Secondary | ICD-10-CM

## 2023-12-31 DIAGNOSIS — I152 Hypertension secondary to endocrine disorders: Secondary | ICD-10-CM

## 2023-12-31 DIAGNOSIS — N183 Chronic kidney disease, stage 3 unspecified: Secondary | ICD-10-CM | POA: Diagnosis not present

## 2023-12-31 DIAGNOSIS — M7989 Other specified soft tissue disorders: Secondary | ICD-10-CM

## 2023-12-31 DIAGNOSIS — I499 Cardiac arrhythmia, unspecified: Secondary | ICD-10-CM

## 2023-12-31 DIAGNOSIS — E1159 Type 2 diabetes mellitus with other circulatory complications: Secondary | ICD-10-CM | POA: Diagnosis not present

## 2023-12-31 DIAGNOSIS — E039 Hypothyroidism, unspecified: Secondary | ICD-10-CM

## 2023-12-31 DIAGNOSIS — M25472 Effusion, left ankle: Secondary | ICD-10-CM

## 2023-12-31 DIAGNOSIS — E1169 Type 2 diabetes mellitus with other specified complication: Secondary | ICD-10-CM

## 2023-12-31 DIAGNOSIS — M79604 Pain in right leg: Secondary | ICD-10-CM | POA: Diagnosis not present

## 2023-12-31 DIAGNOSIS — E1142 Type 2 diabetes mellitus with diabetic polyneuropathy: Secondary | ICD-10-CM | POA: Diagnosis not present

## 2023-12-31 NOTE — Progress Notes (Signed)
Established patient visit  Patient: Wendy Barnes   DOB: 12/17/1940   83 y.o. Female  MRN: [id number] Visit Date: 12/31/2023  Today's healthcare provider: Debera Lat, PA-C   Chief Complaint  Patient presents with   Edema    Patient here with concerns of ankle swelling   Subjective     HPI     Edema    Additional comments: Patient here with concerns of ankle swelling      Last edited by Marjie Skiff, CMA on 12/31/2023  1:35 PM.       Discussed the use of AI scribe software for clinical note transcription with the patient, who gave verbal consent to proceed.  History of Present Illness   The patient, with a history of hypertension, diabetes, hypothyroidism, and kidney problems, presents with a chief complaint of swelling in the legs and feet. The swelling has been ongoing for approximately six months. The patient also experiences aching in the legs, particularly in the evenings. The patient describes the ache as similar to a toothache. The patient is currently taking amlodipine and Lodine for hypertension. The patient also mentions having neuropathy in the feet, characterized by numbness, which started about four to six months ago. The patient admits to not drinking enough water and not walking as much as she should. The patient also mentions having an upcoming appointment with an orthopedic doctor due to problems with the left side, including aching and limited standing time.           12/31/2023    1:39 PM 10/21/2023    1:05 PM 08/13/2023    8:42 AM  Depression screen PHQ 2/9  Decreased Interest 0 0 0  Down, Depressed, Hopeless 0 0 0  PHQ - 2 Score 0 0 0  Altered sleeping   0  Tired, decreased energy   0  Change in appetite   0  Feeling bad or failure about yourself    0  Trouble concentrating   0  Moving slowly or fidgety/restless   0  Suicidal thoughts   0  PHQ-9 Score   0  Difficult doing work/chores   Not difficult at all      12/31/2023    1:39 PM  10/21/2023    1:06 PM 08/13/2023    8:42 AM  GAD 7 : Generalized Anxiety Score  Nervous, Anxious, on Edge 0 0 0  Control/stop worrying 0 0 0  Worry too much - different things 0 0 0  Trouble relaxing 0 0 0  Restless 0 0 0  Easily annoyed or irritable 0 0 0  Afraid - awful might happen 0 0 0  Total GAD 7 Score 0 0 0  Anxiety Difficulty Not difficult at all Not difficult at all Not difficult at all    Medications: Outpatient Medications Prior to Visit  Medication Sig   amLODipine (NORVASC) 10 MG tablet Take 1 tablet (10 mg total) by mouth daily.   benazepril-hydrochlorthiazide (LOTENSIN HCT) 20-12.5 MG tablet TAKE 1 TABLET BY MOUTH TWICE DAILY   Biotin 2500 MCG CAPS Take 2,500 mcg by mouth daily.   Coenzyme Q10 100 MG capsule Take by mouth.   Cranberry-Vitamin C-Vitamin E (CRANBERRY PLUS VITAMIN C) 4200-20-3 MG-MG-UNIT CAPS Take 1 capsule by mouth daily.   gabapentin (NEURONTIN) 100 MG capsule TAKE 1-3 CAPSULES PRIOR TO BED FOR NEUROPATHY   levocetirizine (XYZAL) 5 MG tablet Take 1 tablet (5 mg total) by mouth every evening.   metFORMIN (GLUCOPHAGE) 500 MG tablet  TAKE 1 TABLET BY MOUTH EVERY MORNING WITH BREAKFAST   Omega-3 Fatty Acids (FISH OIL ULTRA) 1400 MG CAPS Take 1,400 mg by mouth daily.   oxybutynin (DITROPAN-XL) 10 MG 24 hr tablet Take 1 tablet (10 mg total) by mouth at bedtime.   rosuvastatin (CRESTOR) 5 MG tablet Take 1 tablet (5 mg total) by mouth daily.   traZODone (DESYREL) 50 MG tablet Take 1 tablet (50 mg total) by mouth at bedtime.   Vibegron (GEMTESA) 75 MG TABS Take 75 mg by mouth daily.   Vibegron (GEMTESA) 75 MG TABS Take 1 tablet (75 mg total) by mouth daily.   azelastine (ASTELIN) 0.1 % nasal spray Place 2 sprays into both nostrils 2 (two) times daily. Use in each nostril as directed   doxycycline (VIBRA-TABS) 100 MG tablet Take 1 tablet (100 mg total) by mouth 2 (two) times daily.   levothyroxine (SYNTHROID) 88 MCG tablet TAKE 1 TABLET EVERY DAY ON EMPTY  STOMACHWITH A GLASS OF WATER AT LEAST 30-60 MINBEFORE BREAKFAST   Semaglutide,0.25 or 0.5MG /DOS, 2 MG/1.5ML SOPN Inject 0.5 mg into the skin once a week.   No facility-administered medications prior to visit.    Review of Systems All negative Except see HPI       Objective    BP 136/68 (BP Location: Left Arm, Patient Position: Sitting, Cuff Size: Large)   Pulse 69   Resp 16   Wt 229 lb (103.9 kg)   SpO2 99%   BMI 38.11 kg/m     Physical Exam Vitals reviewed.  Constitutional:      General: She is not in acute distress.    Appearance: Normal appearance. She is well-developed. She is obese. She is not diaphoretic.  HENT:     Head: Normocephalic and atraumatic.  Eyes:     General: No scleral icterus.    Conjunctiva/sclera: Conjunctivae normal.  Neck:     Thyroid: No thyromegaly.  Cardiovascular:     Rate and Rhythm: Normal rate. Rhythm irregular.     Pulses: Normal pulses.     Heart sounds: Normal heart sounds.  Pulmonary:     Effort: Pulmonary effort is normal. No respiratory distress.     Breath sounds: Normal breath sounds. No wheezing, rhonchi or rales.  Musculoskeletal:     Cervical back: Neck supple.     Right lower leg: No edema.     Left lower leg: No edema.  Lymphadenopathy:     Cervical: No cervical adenopathy.  Skin:    General: Skin is warm and dry.     Findings: No rash.  Neurological:     Mental Status: She is alert and oriented to person, place, and time. Mental status is at baseline.  Psychiatric:        Mood and Affect: Mood normal.        Behavior: Behavior normal.      No results found for any visits on 12/31/23.      Assessment and Plan    1. Leg swelling (Primary) - CBC with Differential/Platelet - Comprehensive metabolic panel - Hemoglobin A1c - Lipid panel - TSH - EKG 12-Lead  2. Irregular heart rhythm - EKG 12-Lead - Ambulatory referral to Cardiology  3. Type 2 diabetes mellitus with diabetic polyneuropathy, without  long-term current use of insulin (HCC) - CBC with Differential/Platelet - Comprehensive metabolic panel - Hemoglobin A1c - Lipid panel - TSH  4. Hypertension associated with diabetes (HCC) - CBC with Differential/Platelet - Comprehensive metabolic panel - Hemoglobin  A1c - Lipid panel - TSH  5. Morbid obesity (HCC) - CBC with Differential/Platelet - Comprehensive metabolic panel - Hemoglobin A1c - Lipid panel - TSH  6. Stage 3 chronic kidney disease, unspecified whether stage 3a or 3b CKD (HCC) - CBC with Differential/Platelet - Comprehensive metabolic panel - Hemoglobin A1c - Lipid panel - TSH  7. Hyperlipidemia associated with type 2 diabetes mellitus (HCC) - CBC with Differential/Platelet - Comprehensive metabolic panel - Hemoglobin A1c - Lipid panel - TSH  8. Right leg pain 9. Acquired hypothyroidism  Lower Extremity Edema   New onset over the past 4-6 months. No associated shortness of breath or chest pain. Possible causes include venous insufficiency, medication side effect (amlodipine), or heart failure.   -Elevate legs when sitting or sleeping.   -Consider compression stockings, ensuring they are not too tight.   -Encouraged to increase water intake to 8-12 cups daily.   -Order labs to assess kidney function and other potential causes.   -Perform EKG EKG showed possible atrial flutter.   -Follow-up in one month.    Hypertension  chronic  Blood pressure slightly elevated at 136/80, patient on amlodipine and Lodine.   -Continue current medications, low salt diet and daily exercise   -Encouraged to monitor blood pressure at home.    Left Hip Pain   New onset, worsens with standing for prolonged periods. No associated trauma. Patient has an upcoming appointment with orthopedic doctor.   -Continue with orthopedic consultation.    Diabetes Chronic needs to update a1c and cmp   -Continue current management.   Continue lifestyle  modifications  Hypothyroidism   Chronic -Continue current management.     Orders Placed This Encounter  Procedures   CBC with Differential/Platelet   Comprehensive metabolic panel    Has the patient fasted?:   Yes   Hemoglobin A1c   Lipid panel    Has the patient fasted?:   Yes   TSH   EKG 12-Lead    No follow-ups on file.   The patient was advised to call back or seek an in-person evaluation if the symptoms worsen or if the condition fails to improve as anticipated.  I discussed the assessment and treatment plan with the patient. The patient was provided an opportunity to ask questions and all were answered. The patient agreed with the plan and demonstrated an understanding of the instructions.  I, Debera Lat, PA-C have reviewed all documentation for this visit. The documentation on 12/31/2023  for the exam, diagnosis, procedures, and orders are all accurate and complete.  Debera Lat, [facility], MMS [facility] [phone] (phone) [phone] (fax)  [facility]

## 2024-01-01 DIAGNOSIS — I152 Hypertension secondary to endocrine disorders: Secondary | ICD-10-CM | POA: Diagnosis not present

## 2024-01-01 DIAGNOSIS — M7989 Other specified soft tissue disorders: Secondary | ICD-10-CM | POA: Diagnosis not present

## 2024-01-01 DIAGNOSIS — E1169 Type 2 diabetes mellitus with other specified complication: Secondary | ICD-10-CM | POA: Diagnosis not present

## 2024-01-01 DIAGNOSIS — E785 Hyperlipidemia, unspecified: Secondary | ICD-10-CM | POA: Diagnosis not present

## 2024-01-01 DIAGNOSIS — E1142 Type 2 diabetes mellitus with diabetic polyneuropathy: Secondary | ICD-10-CM | POA: Diagnosis not present

## 2024-01-01 DIAGNOSIS — N183 Chronic kidney disease, stage 3 unspecified: Secondary | ICD-10-CM | POA: Diagnosis not present

## 2024-01-01 DIAGNOSIS — E1159 Type 2 diabetes mellitus with other circulatory complications: Secondary | ICD-10-CM | POA: Diagnosis not present

## 2024-01-01 DIAGNOSIS — I499 Cardiac arrhythmia, unspecified: Secondary | ICD-10-CM | POA: Insufficient documentation

## 2024-01-02 LAB — CBC WITH DIFFERENTIAL/PLATELET
Basophils Absolute: 0.1 10*3/uL (ref 0.0–0.2)
Basos: 1 %
EOS (ABSOLUTE): 0.3 10*3/uL (ref 0.0–0.4)
Eos: 4 %
Hematocrit: 40.8 % (ref 34.0–46.6)
Hemoglobin: 13.4 g/dL (ref 11.1–15.9)
Immature Grans (Abs): 0 10*3/uL (ref 0.0–0.1)
Immature Granulocytes: 0 %
Lymphocytes Absolute: 2.6 10*3/uL (ref 0.7–3.1)
Lymphs: 32 %
MCH: 28.9 pg (ref 26.6–33.0)
MCHC: 32.8 g/dL (ref 31.5–35.7)
MCV: 88 fL (ref 79–97)
Monocytes Absolute: 0.6 10*3/uL (ref 0.1–0.9)
Monocytes: 7 %
Neutrophils Absolute: 4.6 10*3/uL (ref 1.4–7.0)
Neutrophils: 56 %
Platelets: 227 10*3/uL (ref 150–450)
RBC: 4.63 x10E6/uL (ref 3.77–5.28)
RDW: 13.5 % (ref 11.7–15.4)
WBC: 8.1 10*3/uL (ref 3.4–10.8)

## 2024-01-02 LAB — HEMOGLOBIN A1C
Est. average glucose Bld gHb Est-mCnc: 146 mg/dL
Hgb A1c MFr Bld: 6.7 % — ABNORMAL HIGH (ref 4.8–5.6)

## 2024-01-02 LAB — COMPREHENSIVE METABOLIC PANEL
ALT: 19 [IU]/L (ref 0–32)
AST: 19 [IU]/L (ref 0–40)
Albumin: 4.4 g/dL (ref 3.8–4.8)
Alkaline Phosphatase: 93 [IU]/L (ref 44–121)
BUN/Creatinine Ratio: 24 (ref 12–28)
BUN: 18 mg/dL (ref 8–27)
Bilirubin Total: 0.4 mg/dL (ref 0.0–1.2)
CO2: 25 mmol/L (ref 20–29)
Calcium: 10.2 mg/dL (ref 8.7–10.3)
Chloride: 104 mmol/L (ref 96–106)
Creatinine, Ser: 0.76 mg/dL (ref 0.57–1.00)
Globulin, Total: 2.3 g/dL (ref 1.5–4.5)
Glucose: 145 mg/dL — ABNORMAL HIGH (ref 70–99)
Potassium: 4.1 mmol/L (ref 3.5–5.2)
Sodium: 143 mmol/L (ref 134–144)
Total Protein: 6.7 g/dL (ref 6.0–8.5)
eGFR: 79 mL/min/{1.73_m2} (ref >59)

## 2024-01-02 LAB — LIPID PANEL
Chol/HDL Ratio: 2.9 {ratio} (ref 0.0–4.4)
Cholesterol, Total: 178 mg/dL (ref 100–199)
HDL: 62 mg/dL (ref >39)
LDL Chol Calc (NIH): 97 mg/dL (ref 0–99)
Triglycerides: 108 mg/dL (ref 0–149)
VLDL Cholesterol Cal: 19 mg/dL (ref 5–40)

## 2024-01-02 LAB — TSH: TSH: 3.97 u[IU]/mL (ref 0.450–4.500)

## 2024-01-04 DIAGNOSIS — R600 Localized edema: Secondary | ICD-10-CM | POA: Diagnosis not present

## 2024-01-04 DIAGNOSIS — I480 Paroxysmal atrial fibrillation: Secondary | ICD-10-CM | POA: Diagnosis not present

## 2024-01-04 DIAGNOSIS — E1100 Type 2 diabetes mellitus with hyperosmolarity without nonketotic hyperglycemic-hyperosmolar coma (NKHHC): Secondary | ICD-10-CM | POA: Diagnosis not present

## 2024-01-04 DIAGNOSIS — I10 Essential (primary) hypertension: Secondary | ICD-10-CM | POA: Diagnosis not present

## 2024-01-11 DIAGNOSIS — I480 Paroxysmal atrial fibrillation: Secondary | ICD-10-CM | POA: Diagnosis not present

## 2024-01-12 ENCOUNTER — Ambulatory Visit: Payer: Self-pay | Admitting: *Deleted

## 2024-01-12 ENCOUNTER — Encounter: Payer: Self-pay | Admitting: Physician Assistant

## 2024-01-12 ENCOUNTER — Other Ambulatory Visit: Payer: Self-pay | Admitting: Physician Assistant

## 2024-01-12 DIAGNOSIS — E034 Atrophy of thyroid (acquired): Secondary | ICD-10-CM

## 2024-01-12 DIAGNOSIS — I480 Paroxysmal atrial fibrillation: Secondary | ICD-10-CM | POA: Insufficient documentation

## 2024-01-12 NOTE — Telephone Encounter (Signed)
Medication Refill -  Most Recent Primary Care Visit:  Provider: Debera Lat  Department: [facility]  Visit Type: OFFICE VISIT  Date: 12/31/2023  Medication: levothyroxine (SYNTHROID) 88 MCG tablet   Has the patient contacted their pharmacy? Yes (Agent: If no, request that the patient contact the pharmacy for the refill. If patient does not wish to contact the pharmacy document the reason why and proceed with request.) (Agent: If yes, when and what did the pharmacy advise?)  Is this the correct pharmacy for this prescription? Yes If no, delete pharmacy and type the correct one.  This is the patient's preferred pharmacy:  [id number] [place], [place] - [address] AT [facility] OF [address] [place] [address] Phone: [phone] Fax: [phone]   Has the prescription been filled recently? Yes  Is the patient out of the medication? No  Has the patient been seen for an appointment in the last year OR does the patient have an upcoming appointment? Yes  Can we respond through MyChart? Yes  Agent: Please be advised that Rx refills may take up to 3 business days. We ask that you follow-up with your pharmacy.

## 2024-01-12 NOTE — Telephone Encounter (Signed)
  Chief Complaint: Pt's dose of metformin 500 mg was increased to 1,000 mg by Derenda Mis, PA-C per Janna's MyChart message to pt on 01/12/2024 at 4:47 AM. Pt has an almost full bottle of metformin 500 mg pills.   Is it ok for her to take 2 pills to equal the 1000 mg and then put in for a refill of the 1000 mg when this bottle is getting low?  Symptoms: N/A Frequency: N/A Pertinent Negatives: Patient denies N/A Disposition: [] ED /[] Urgent Care (no appt availability in office) / [] Appointment(In office/virtual)/ []  [place] [facility]/ [] Home Care/ [] Refused Recommended Disposition /[] [place]/ [x]  Follow-up with PCP Additional Notes: I let pt know someone would call her back to let her know if it was ok.    Pt. Agreeable to this plan.

## 2024-01-12 NOTE — Telephone Encounter (Signed)
Message from [place] E sent at 01/12/2024  9:31 AM EST  Summary: Medication questions, metformin   Pt called, she has questions about her medicine changes regarding metformin.  Best contact: [phone]          Call History  Contact Date/Time Type Contact Phone/Fax By  01/12/2024 09:31 AM EST Phone (Incoming) Stalzer, Kayelee (Self) [phone] Rexene Edison) Randol Kern   Reason for Disposition  [1] Caller has URGENT medicine question about med that PCP or specialist prescribed AND [2] triager unable to answer question  Answer Assessment - Initial Assessment Questions 1. NAME of MEDICINE: "What medicine(s) are you calling about?"     Metformin    Now I'm taking 500 mg daily.   On [employer], PA-C wants me to increase to 1,000 mg daily.    I have almost a full bottle of metformin 500 mg pills.   Starting tomorrow I'll start taking 2 pills instead of one.         2. QUESTION: "What is your question?" (e.g., double dose of medicine, side effect)     Is it ok to take 2 of the 500 mg metformin pills to equal the 1,000 mg and when I'm getting low then put in for a refill of the 1,000 mg pills so I can use up this bottle I have now?   3. PRESCRIBER: "Who prescribed the medicine?" Reason: if prescribed by specialist, call should be referred to that group.     Debera Lat, PA-C 4. SYMPTOMS: "Do you have any symptoms?" If Yes, ask: "What symptoms are you having?"  "How bad are the symptoms (e.g., mild, moderate, severe)     N/A 5. PREGNANCY:  "Is there any chance that you are pregnant?" "When was your last menstrual period?"     N/A  Protocols used: Medication Question Call-A-AH

## 2024-01-13 ENCOUNTER — Other Ambulatory Visit: Payer: Self-pay

## 2024-01-13 DIAGNOSIS — E1142 Type 2 diabetes mellitus with diabetic polyneuropathy: Secondary | ICD-10-CM

## 2024-01-13 MED ORDER — METFORMIN HCL 1000 MG PO TABS: 1000.0000 mg | ORAL_TABLET | Freq: Every day | ORAL | 0 refills | Status: DC

## 2024-01-13 NOTE — Telephone Encounter (Signed)
PER DPR Deatiled voicemail left.  CRM created. Ok for [facility] to advise as below   Yes, 2 tablets can be taken to total 1000 mg. Once current bottle is completed please contact pharmacy to have new rx filled.

## 2024-01-14 MED ORDER — LEVOTHYROXINE SODIUM 88 MCG PO TABS: 88.0000 ug | ORAL_TABLET | Freq: Every day | ORAL | 0 refills | Status: DC

## 2024-01-14 NOTE — Telephone Encounter (Signed)
Requested Prescriptions  Pending Prescriptions Disp Refills   levothyroxine (SYNTHROID) 88 MCG tablet 90 tablet 0    Sig: Take 1 tablet (88 mcg total) by mouth daily before breakfast.     Endocrinology:  Hypothyroid Agents Passed - 01/14/2024 10:33 AM      Passed - TSH in normal range and within 360 days    TSH  Date Value Ref Range Status  01/01/2024 3.970 0.450 - 4.500 uIU/mL Final         Passed - Valid encounter within last 12 months    Recent Outpatient Visits           2 weeks ago Leg swelling   [place] [facility] [place], [place], PA-C   2 months ago Community acquired bacterial pneumonia   [facility] Merita Norton T, FNP   5 months ago Encounter for subsequent annual wellness visit (AWV) in Medicare patient   [facility] Merita Norton T, FNP   10 months ago Hypertension associated with diabetes [facility])   [place] [facility] Merita Norton T, FNP   11 months ago Type 2 diabetes mellitus with diabetic polyneuropathy, without long-term current use of insulin [facility])   [place] [facility] Jacky Kindle, FNP       Future Appointments             In 2 weeks Debera Lat, PA-C [place] [facility], [facility]   In 1 month Vaillancourt, [place], PA-C [facility] [place]   In 2 months [place], [place], PA-C  [facility], [facility]   In 7 months Debera Lat, PA-C [employer], [facility]   In 7 months Deirdre Evener, MD [facility]

## 2024-01-26 NOTE — Progress Notes (Unsigned)
Electrophysiology Office Note:    Date:  01/27/2024   ID:  [name], DOB [birth year], MRN [id number]  [facility] Cardiologist:  None  [facility] Electrophysiologist:  Lanier Prude, MD   Referring MD: Debera Lat, PA-C   Chief Complaint: Atrial fibrillation  History of Present Illness:    Ms. Wendy Barnes is an 83 year old woman who I am seeing for an evaluation of atrial fibrillation at the request of Dr. Juliann Pares.  The patient has atrial fibrillation, hypertension, diabetes and hyperlipidemia.  The patient last saw a cardiologist at [facility] January 04, 2024.  The patient is prescribed Eliquis 5 mg by mouth twice daily for stroke prophylaxis.  At the appoint with [facility] clinic, they thought she was asymptomatic while in atrial fibrillation.  A 2-week monitor was ordered to assess the burden of her atrial fibrillation.  An echocardiogram was also ordered.  She is with her daughter today in clinic.  She reports fleeting lightheadedness symptoms with her pauses.  She cannot tell when she is in or out of atrial fibrillation.  She has been taking Eliquis without evidence of bleeding.    Their past medical, social and family history was reviewed.   ROS:   Please see the history of present illness.    All other systems reviewed and are negative.  EKGs/Labs/Other Studies Reviewed:    The following studies were reviewed today:   [employer] Holter report.  Monitor personally reviewed Heart rate 31-1 27, average 65 Less than 1% PVC burden 2% PAC burden 26% A-fib burden 33 pauses, longest 8.2 seconds  January 11, 2024 echo (report only) EF greater than 55% Normal RV function Moderate TR  December 31, 2023 EKG shows atrial flutter with variable AV block  EKG Interpretation Date/Time:  Wednesday January 27 2024 09:43:15 EST Ventricular Rate:  66 PR Interval:  202 QRS Duration:  88 QT Interval:  414 QTC Calculation: 434 R Axis:   54  Text  Interpretation: Sinus rhythm with Premature atrial complexes Confirmed by Steffanie Dunn [phone]) on 01/27/2024 9:55:04 AM    Physical Exam:    VS:  BP 138/66   Pulse 67   Ht 5\' 5"  (1.651 m)   Wt 218 lb 6.4 oz (99.1 kg)   SpO2 97%   BMI 36.34 kg/m     Wt Readings from Last 3 Encounters:  01/27/24 218 lb 6.4 oz (99.1 kg)  12/31/23 229 lb (103.9 kg)  10/21/23 223 lb (101.2 kg)     GEN: no distress CARD: RRR, No MRG RESP: No IWOB. CTAB.        ASSESSMENT AND PLAN:    1. Paroxysmal atrial fibrillation (HCC)   2. Primary hypertension     #Tachybradycardia syndrome #Symptomatic bradycardia #Sinus node dysfunction #Sinus pauses The patient was prescribed a recent heart monitor which showed a greater than 10-second pause.  She is being referred for consideration of permanent pacemaker which I think is very reasonable given the duration of this pause.  I have discussed the pacemaker implant procedure in detail the patient including the risks, recovery.  She will need to hold her Eliquis prior to the device implant with plans to restart 5 days after implant.  I discussed the risks associate with holding anticoagulation and she wished to proceed.  Plan for implant Friday at [facility] in [place].  Plan for Medtronic dual-chamber permanent pacemaker.  Risks, benefits, alternatives to PPM implantation were discussed in detail with the patient today. The patient understands that  the risks include but are not limited to bleeding, infection, pneumothorax, perforation, tamponade, vascular damage, renal failure, MI, stroke, death, and lead dislodgement and wishes to proceed.  We will therefore schedule device implantation at the next available time.   #Paroxysmal atrial fibrillation Asymptomatic Relatively recent diagnosis Continue Eliquis for stroke prophylaxis, no evidence of bleeding      Signed, Sheria Lang T. Lalla Brothers, MD, [facility], [facility] 01/27/2024 10:16 AM     Electrophysiology [facility]

## 2024-01-26 NOTE — H&P (View-Only) (Signed)
Electrophysiology Office Note:    Date:  01/27/2024   ID:  [name], DOB 01/17/1941, MRN [id number]  [facility] Cardiologist:  None  [facility] Electrophysiologist:  Lanier Prude, MD   Referring MD: Debera Lat, PA-C   Chief Complaint: Atrial fibrillation  History of Present Illness:    Ms. Wendy Barnes is an 83 year old woman who I am seeing for an evaluation of atrial fibrillation at the request of Dr. Juliann Pares.  The patient has atrial fibrillation, hypertension, diabetes and hyperlipidemia.  The patient last saw a cardiologist at [facility] January 04, 2024.  The patient is prescribed Eliquis 5 mg by mouth twice daily for stroke prophylaxis.  At the appoint with [facility] clinic, they thought she was asymptomatic while in atrial fibrillation.  A 2-week monitor was ordered to assess the burden of her atrial fibrillation.  An echocardiogram was also ordered.  She is with her daughter today in clinic.  She reports fleeting lightheadedness symptoms with her pauses.  She cannot tell when she is in or out of atrial fibrillation.  She has been taking Eliquis without evidence of bleeding.    Their past medical, social and family history was reviewed.   ROS:   Please see the history of present illness.    All other systems reviewed and are negative.  EKGs/Labs/Other Studies Reviewed:    The following studies were reviewed today:   [employer] Holter report.  Monitor personally reviewed Heart rate 31-1 27, average 65 Less than 1% PVC burden 2% PAC burden 26% A-fib burden 33 pauses, longest 8.2 seconds  January 11, 2024 echo (report only) EF greater than 55% Normal RV function Moderate TR  December 31, 2023 EKG shows atrial flutter with variable AV block  EKG Interpretation Date/Time:  Wednesday January 27 2024 09:43:15 EST Ventricular Rate:  66 PR Interval:  202 QRS Duration:  88 QT Interval:  414 QTC Calculation: 434 R Axis:   54  Text  Interpretation: Sinus rhythm with Premature atrial complexes Confirmed by Steffanie Dunn [id number]) on 01/27/2024 9:55:04 AM    Physical Exam:    VS:  BP 138/66   Pulse 67   Ht 5\' 5"  (1.651 m)   Wt 218 lb 6.4 oz (99.1 kg)   SpO2 97%   BMI 36.34 kg/m     Wt Readings from Last 3 Encounters:  01/27/24 218 lb 6.4 oz (99.1 kg)  12/31/23 229 lb (103.9 kg)  10/21/23 223 lb (101.2 kg)     GEN: no distress CARD: RRR, No MRG RESP: No IWOB. CTAB.        ASSESSMENT AND PLAN:    1. Paroxysmal atrial fibrillation (HCC)   2. Primary hypertension     #Tachybradycardia syndrome #Symptomatic bradycardia #Sinus node dysfunction #Sinus pauses The patient was prescribed a recent heart monitor which showed a greater than 10-second pause.  She is being referred for consideration of permanent pacemaker which I think is very reasonable given the duration of this pause.  I have discussed the pacemaker implant procedure in detail the patient including the risks, recovery.  She will need to hold her Eliquis prior to the device implant with plans to restart 5 days after implant.  I discussed the risks associate with holding anticoagulation and she wished to proceed.  Plan for implant Friday at [facility] in [place].  Plan for Medtronic dual-chamber permanent pacemaker.  Risks, benefits, alternatives to PPM implantation were discussed in detail with the patient today. The patient understands that  the risks include but are not limited to bleeding, infection, pneumothorax, perforation, tamponade, vascular damage, renal failure, MI, stroke, death, and lead dislodgement and wishes to proceed.  We will therefore schedule device implantation at the next available time.   #Paroxysmal atrial fibrillation Asymptomatic Relatively recent diagnosis Continue Eliquis for stroke prophylaxis, no evidence of bleeding      Signed, Sheria Lang T. Lalla Brothers, MD, [facility], [facility] 01/27/2024 10:16 AM     Electrophysiology [facility]

## 2024-01-27 ENCOUNTER — Ambulatory Visit: Payer: Medicare Other | Attending: Cardiology | Admitting: Cardiology

## 2024-01-27 ENCOUNTER — Encounter: Payer: Self-pay | Admitting: Cardiology

## 2024-01-27 ENCOUNTER — Other Ambulatory Visit: Payer: Self-pay

## 2024-01-27 VITALS — BP 138/66 | HR 67 | Ht 65.0 in | Wt 218.4 lb

## 2024-01-27 DIAGNOSIS — I480 Paroxysmal atrial fibrillation: Secondary | ICD-10-CM | POA: Diagnosis not present

## 2024-01-27 DIAGNOSIS — I10 Essential (primary) hypertension: Secondary | ICD-10-CM | POA: Insufficient documentation

## 2024-01-27 NOTE — Patient Instructions (Signed)
Medication Instructions:  Your physician recommends that you continue on your current medications as directed. Please refer to the Current Medication list given to you today.  *If you need a refill on your cardiac medications before your next appointment, please call your pharmacy*  Lab Work: TODAY: BMET and CBC  If you have labs (blood work) drawn today and your tests are completely normal, you will receive your results only by: MyChart Message (if you have MyChart) OR A paper copy in the mail If you have any lab test that is abnormal or we need to change your treatment, we will call you to review the results.  Testing/Procedures: Pacemaker Your physician has recommended that you have a pacemaker inserted. A pacemaker is a small device that is placed under the skin of your chest or abdomen to help control abnormal heart rhythms. This device uses electrical pulses to prompt the heart to beat at a normal rate. Pacemakers are used to treat heart rhythms that are too slow. Wire (leads) are attached to the pacemaker that goes into the chambers of you heart. This is done in the hospital and usually requires and overnight stay. Please see the instruction sheet given to you today for more information.  Follow-Up: At [facility], you and your health needs are our priority.  As part of our continuing mission to provide you with exceptional heart care, we have created designated Provider Care Teams.  These Care Teams include your primary Cardiologist (physician) and Advanced Practice Providers (APPs -  Physician Assistants and Nurse Practitioners) who all work together to provide you with the care you need, when you need it.   Your next appointment:   We will call you to schedule your post-procedure appointmetnts

## 2024-01-28 LAB — CBC WITH DIFFERENTIAL/PLATELET
Basophils Absolute: 0.1 10*3/uL (ref 0.0–0.2)
Basos: 1 %
EOS (ABSOLUTE): 0.2 10*3/uL (ref 0.0–0.4)
Eos: 2 %
Hematocrit: 40.8 % (ref 34.0–46.6)
Hemoglobin: 13.5 g/dL (ref 11.1–15.9)
Immature Grans (Abs): 0 10*3/uL (ref 0.0–0.1)
Immature Granulocytes: 0 %
Lymphocytes Absolute: 2.7 10*3/uL (ref 0.7–3.1)
Lymphs: 26 %
MCH: 29.5 pg (ref 26.6–33.0)
MCHC: 33.1 g/dL (ref 31.5–35.7)
MCV: 89 fL (ref 79–97)
Monocytes Absolute: 0.7 10*3/uL (ref 0.1–0.9)
Monocytes: 6 %
Neutrophils Absolute: 6.7 10*3/uL (ref 1.4–7.0)
Neutrophils: 65 %
Platelets: 229 10*3/uL (ref 150–450)
RBC: 4.58 x10E6/uL (ref 3.77–5.28)
RDW: 13.7 % (ref 11.7–15.4)
WBC: 10.3 10*3/uL (ref 3.4–10.8)

## 2024-01-28 LAB — BASIC METABOLIC PANEL
BUN/Creatinine Ratio: 22 (ref 12–28)
BUN: 18 mg/dL (ref 8–27)
CO2: 21 mmol/L (ref 20–29)
Calcium: 10.2 mg/dL (ref 8.7–10.3)
Chloride: 101 mmol/L (ref 96–106)
Creatinine, Ser: 0.82 mg/dL (ref 0.57–1.00)
Glucose: 101 mg/dL — ABNORMAL HIGH (ref 70–99)
Potassium: 4.3 mmol/L (ref 3.5–5.2)
Sodium: 139 mmol/L (ref 134–144)
eGFR: 72 mL/min/{1.73_m2} (ref >59)

## 2024-01-28 NOTE — Pre-Procedure Instructions (Signed)
Instructed patient on the following items: Arrival time 0800 Nothing to eat or drink after midnight No meds AM of procedure Responsible person to drive you home and stay with you for 24 hrs Wash with special soap night before and morning of procedure If on anti-coagulant drug instructions Eliquis- last dose 2/12.

## 2024-01-29 ENCOUNTER — Other Ambulatory Visit: Payer: Self-pay

## 2024-01-29 ENCOUNTER — Ambulatory Visit (HOSPITAL_COMMUNITY)
Admission: RE | Admit: 2024-01-29 | Discharge: 2024-01-30 | Disposition: A | Discharge status: Home / Self Care | Payer: Medicare Other | Source: Ambulatory Visit | Attending: Cardiology | Admitting: Cardiology

## 2024-01-29 ENCOUNTER — Encounter (HOSPITAL_COMMUNITY)
Admission: RE | Disposition: A | Discharge status: Home / Self Care | Payer: Self-pay | Source: Ambulatory Visit | Attending: Cardiology

## 2024-01-29 DIAGNOSIS — Z7901 Long term (current) use of anticoagulants: Secondary | ICD-10-CM | POA: Insufficient documentation

## 2024-01-29 DIAGNOSIS — I495 Sick sinus syndrome: Secondary | ICD-10-CM | POA: Insufficient documentation

## 2024-01-29 DIAGNOSIS — E785 Hyperlipidemia, unspecified: Secondary | ICD-10-CM | POA: Insufficient documentation

## 2024-01-29 DIAGNOSIS — I4891 Unspecified atrial fibrillation: Secondary | ICD-10-CM

## 2024-01-29 DIAGNOSIS — R001 Bradycardia, unspecified: Secondary | ICD-10-CM | POA: Diagnosis not present

## 2024-01-29 DIAGNOSIS — E119 Type 2 diabetes mellitus without complications: Secondary | ICD-10-CM | POA: Insufficient documentation

## 2024-01-29 DIAGNOSIS — Z79899 Other long term (current) drug therapy: Secondary | ICD-10-CM | POA: Insufficient documentation

## 2024-01-29 DIAGNOSIS — Z950 Presence of cardiac pacemaker: Secondary | ICD-10-CM | POA: Diagnosis present

## 2024-01-29 DIAGNOSIS — I10 Essential (primary) hypertension: Secondary | ICD-10-CM | POA: Insufficient documentation

## 2024-01-29 DIAGNOSIS — I480 Paroxysmal atrial fibrillation: Secondary | ICD-10-CM | POA: Insufficient documentation

## 2024-01-29 HISTORY — PX: PACEMAKER IMPLANT: EP1218

## 2024-01-29 LAB — GLUCOSE, CAPILLARY
Glucose-Capillary: 128 mg/dL — ABNORMAL HIGH (ref 70–99)
Glucose-Capillary: 143 mg/dL — ABNORMAL HIGH (ref 70–99)

## 2024-01-29 SURGERY — PACEMAKER IMPLANT

## 2024-01-29 MED ORDER — METOPROLOL SUCCINATE ER 25 MG PO TB24
25.0000 mg | ORAL_TABLET | Freq: Two times a day (BID) | ORAL | Status: DC
  Administered 2024-01-29 – 2024-01-30 (×2): 25 mg via ORAL
  Filled 2024-01-29 (×2): qty 1

## 2024-01-29 MED ORDER — SODIUM CHLORIDE 0.9 % IV SOLN: 80.0000 mg | INTRAVENOUS | Status: AC

## 2024-01-29 MED ORDER — SODIUM CHLORIDE 0.9 % IV SOLN
INTRAVENOUS | Status: AC
  Administered 2024-01-29: 80 mg
  Filled 2024-01-29: qty 2

## 2024-01-29 MED ORDER — CHLORHEXIDINE GLUCONATE 4 % EX SOLN
4.0000 | Freq: Once | CUTANEOUS | Status: DC
Start: 2024-01-29 — End: 2024-01-29

## 2024-01-29 MED ORDER — MIDAZOLAM HCL 5 MG/5ML IJ SOLN
INTRAMUSCULAR | Status: DC | PRN
  Administered 2024-01-29: 1 mg via INTRAVENOUS
  Administered 2024-01-29: .5 mg via INTRAVENOUS
  Administered 2024-01-29: 1 mg via INTRAVENOUS
  Administered 2024-01-29: .5 mg via INTRAVENOUS

## 2024-01-29 MED ORDER — ACETAMINOPHEN 325 MG PO TABS
325.0000 mg | ORAL_TABLET | ORAL | Status: DC | PRN
  Administered 2024-01-29 (×2): 650 mg via ORAL
  Administered 2024-01-30: 325 mg via ORAL
  Administered 2024-01-30: 650 mg via ORAL
  Filled 2024-01-29 (×4): qty 2

## 2024-01-29 MED ORDER — LIDOCAINE HCL (PF) 1 % IJ SOLN
INTRAMUSCULAR | Status: DC | PRN
  Administered 2024-01-29: 50 mL

## 2024-01-29 MED ORDER — FENTANYL CITRATE (PF) 100 MCG/2ML IJ SOLN
INTRAMUSCULAR | Status: DC | PRN
  Administered 2024-01-29: 12.5 ug via INTRAVENOUS
  Administered 2024-01-29: 25 ug via INTRAVENOUS
  Administered 2024-01-29: 12.5 ug via INTRAVENOUS
  Administered 2024-01-29: 25 ug via INTRAVENOUS

## 2024-01-29 MED ORDER — VANCOMYCIN HCL IN DEXTROSE 1-5 GM/200ML-% IV SOLN
INTRAVENOUS | Status: AC
  Administered 2024-01-29: 1000 mg via INTRAVENOUS
  Filled 2024-01-29: qty 200

## 2024-01-29 MED ORDER — LEVOTHYROXINE SODIUM 88 MCG PO TABS
88.0000 ug | ORAL_TABLET | Freq: Every day | ORAL | Status: DC
Start: 2024-01-30 — End: 2024-01-30
  Administered 2024-01-30: 88 ug via ORAL
  Filled 2024-01-29: qty 1

## 2024-01-29 MED ORDER — ONDANSETRON HCL 4 MG/2ML IJ SOLN: 4.0000 mg | Freq: Four times a day (QID) | INTRAMUSCULAR | Status: DC | PRN

## 2024-01-29 MED ORDER — VANCOMYCIN HCL IN DEXTROSE 1-5 GM/200ML-% IV SOLN: 1000.0000 mg | Freq: Once | INTRAVENOUS | Status: AC

## 2024-01-29 MED ORDER — CEFAZOLIN SODIUM-DEXTROSE 2-4 GM/100ML-% IV SOLN: 2.0000 g | INTRAVENOUS | Status: DC

## 2024-01-29 MED ORDER — FENTANYL CITRATE (PF) 100 MCG/2ML IJ SOLN
INTRAMUSCULAR | Status: AC
  Filled 2024-01-29: qty 2

## 2024-01-29 MED ORDER — HEPARIN (PORCINE) IN NACL 1000-0.9 UT/500ML-% IV SOLN
INTRAVENOUS | Status: DC | PRN
  Administered 2024-01-29: 500 mL

## 2024-01-29 MED ORDER — MIDAZOLAM HCL 2 MG/2ML IJ SOLN
INTRAMUSCULAR | Status: AC
Start: 2024-01-29 — End: ?
  Filled 2024-01-29: qty 2

## 2024-01-29 MED ORDER — LIDOCAINE HCL (PF) 1 % IJ SOLN
INTRAMUSCULAR | Status: AC
  Filled 2024-01-29: qty 30

## 2024-01-29 MED ORDER — POVIDONE-IODINE 10 % EX SWAB
2.0000 | Freq: Once | CUTANEOUS | Status: DC
Start: 2024-01-29 — End: 2024-01-29

## 2024-01-29 MED ORDER — SODIUM CHLORIDE 0.9 % IV SOLN
INTRAVENOUS | Status: DC
Start: 2024-01-29 — End: 2024-01-29

## 2024-01-29 MED ORDER — MIDAZOLAM HCL 2 MG/2ML IJ SOLN
INTRAMUSCULAR | Status: AC
  Filled 2024-01-29: qty 2

## 2024-01-29 MED ORDER — HYDROCHLOROTHIAZIDE 25 MG PO TABS
25.0000 mg | ORAL_TABLET | Freq: Every day | ORAL | Status: DC
  Administered 2024-01-30: 25 mg via ORAL
  Filled 2024-01-29: qty 1

## 2024-01-29 MED ORDER — BENAZEPRIL-HYDROCHLOROTHIAZIDE 20-12.5 MG PO TABS: 2.0000 | ORAL_TABLET | Freq: Every morning | ORAL | Status: DC

## 2024-01-29 MED ORDER — AMLODIPINE BESYLATE 5 MG PO TABS
10.0000 mg | ORAL_TABLET | Freq: Every day | ORAL | Status: DC
  Administered 2024-01-30: 10 mg via ORAL
  Filled 2024-01-29: qty 2

## 2024-01-29 MED ORDER — BENAZEPRIL HCL 20 MG PO TABS
40.0000 mg | ORAL_TABLET | Freq: Every day | ORAL | Status: DC
  Administered 2024-01-30: 40 mg via ORAL
  Filled 2024-01-29: qty 2

## 2024-01-29 MED ORDER — OXYBUTYNIN CHLORIDE ER 10 MG PO TB24
10.0000 mg | ORAL_TABLET | Freq: Every morning | ORAL | Status: DC
  Administered 2024-01-30: 10 mg via ORAL
  Filled 2024-01-29 (×2): qty 1

## 2024-01-29 MED ORDER — LIDOCAINE HCL (PF) 1 % IJ SOLN
INTRAMUSCULAR | Status: AC
  Filled 2024-01-29: qty 60

## 2024-01-29 SURGICAL SUPPLY — 13 items
CABLE SURGICAL S-101-97-12 (CABLE) ×1 IMPLANT
CATH RIGHTSITE C315HIS02 (CATHETERS) IMPLANT
ELECT DEFIB PAD ADLT CADENCE (PAD) IMPLANT
IPG PACE AZUR XT DR MRI W1DR01 (Pacemaker) IMPLANT
LEAD CAPSURE NOVUS 5076-52CM (Lead) IMPLANT
LEAD SELECT SECURE 3830 383069 (Lead) IMPLANT
PACE AZURE XT DR MRI W1DR01 (Pacemaker) ×1 IMPLANT
SELECT SECURE 3830 383069 (Lead) ×1 IMPLANT
SHEATH 7FR PRELUDE SNAP 13 (SHEATH) IMPLANT
SHEATH PROBE COVER 6X72 (BAG) IMPLANT
SLITTER 6232ADJ (MISCELLANEOUS) IMPLANT
TRAY PACEMAKER INSERTION (PACKS) ×1 IMPLANT
WIRE HI TORQ VERSACORE-J 145CM (WIRE) IMPLANT

## 2024-01-29 NOTE — Plan of Care (Signed)

## 2024-01-29 NOTE — Interval H&P Note (Signed)
History and Physical Interval Note:  01/29/2024 9:21 AM  Wendy Barnes  has presented today for surgery, with the diagnosis of bradicardia.  The various methods of treatment have been discussed with the patient and family. After consideration of risks, benefits and other options for treatment, the patient has consented to  Procedure(s): PACEMAKER IMPLANT (N/A) as a surgical intervention.  The patient's history has been reviewed, patient examined, no change in status, stable for surgery.  I have reviewed the patient's chart and labs.  Questions were answered to the patient's satisfaction.     Wendy Barnes

## 2024-01-29 NOTE — Discharge Instructions (Signed)
After Your Pacemaker   You have a Medtronic Pacemaker  ACTIVITY Do not lift your arm above shoulder height for 1 week after your procedure. After 7 days, you may progress as below.  You should remove your sling 24 hours after your procedure, unless otherwise instructed by your provider.     Friday February 05, 2024  Saturday February 06, 2024 Sunday February 07, 2024 Monday February 08, 2024   Do not lift, push, pull, or carry anything over 10 pounds with the affected arm until 6 weeks (Friday March 11, 2024 ) after your procedure.   You may drive AFTER your wound check, unless you have been told otherwise by your provider.   Ask your healthcare provider when you can go back to work   INCISION/Dressing If you are on a blood thinner such as Coumadin, Xarelto, Eliquis, Plavix, or Pradaxa please confirm with your provider when this should be resumed. Do not resume Eliquis until 02/04/24  If large square, outer bandage is left in place, this can be removed after 24 hours from your procedure. Do not remove steri-strips or glue as below.   If a PRESSURE DRESSING (a bulky dressing that usually goes up over your shoulder) was applied or left in place, please follow instructions given by your provider on when to return to have this removed.   Monitor your Pacemaker site for redness, swelling, and drainage. Call the device clinic at [phone] if you experience these symptoms or fever/chills.  If your incision is sealed with Steri-strips or staples, you may shower 7 days after your procedure or when told by your provider. Do not remove the steri-strips or let the shower hit directly on your site. You may wash around your site with soap and water.    If you were discharged in a sling, please do not wear this during the day more than 48 hours after your surgery unless otherwise instructed. This may increase the risk of stiffness and soreness in your shoulder.   Avoid lotions, ointments, or  perfumes over your incision until it is well-healed.  You may use a hot tub or a pool AFTER your wound check appointment if the incision is completely closed.  Pacemaker Alerts:  Some alerts are vibratory and others beep. These are NOT emergencies. Please call our office to let us know. If this occurs at night or on weekends, it can wait until the next business day. Send a remote transmission.  If your device is capable of reading fluid status (for heart failure), you will be offered monthly monitoring to review this with you.   DEVICE MANAGEMENT Remote monitoring is used to monitor your pacemaker from home. This monitoring is scheduled every 91 days by our office. It allows [place] to keep an eye on the functioning of your device to ensure it is working properly. You will routinely see your Electrophysiologist annually (more often if necessary).   You should receive your ID card for your new device in 4-8 weeks. Keep this card with you at all times once received. Consider wearing a medical alert bracelet or necklace.  Your Pacemaker may be MRI compatible. This will be discussed at your next office visit/wound check.  You should avoid contact with strong electric or magnetic fields.   Do not use amateur (ham) radio equipment or electric (arc) welding torches. MP3 player headphones with magnets should not be used. Some devices are safe to use if held at least 12 inches (30 cm) from your Pacemaker.  These include power tools, lawn mowers, and speakers. If you are unsure if something is safe to use, ask your health care provider.  When using your cell phone, hold it to the ear that is on the opposite side from the Pacemaker. Do not leave your cell phone in a pocket over the Pacemaker.  You may safely use electric blankets, heating pads, computers, and microwave ovens.  Call the office right away if: You have chest pain. You feel more short of breath than you have felt before. You feel more  light-headed than you have felt before. Your incision starts to open up.  This information is not intended to replace advice given to you by your health care provider. Make sure you discuss any questions you have with your health care provider.

## 2024-01-30 ENCOUNTER — Ambulatory Visit (HOSPITAL_COMMUNITY): Payer: Medicare Other

## 2024-01-30 DIAGNOSIS — I10 Essential (primary) hypertension: Secondary | ICD-10-CM | POA: Diagnosis not present

## 2024-01-30 DIAGNOSIS — Z79899 Other long term (current) drug therapy: Secondary | ICD-10-CM | POA: Diagnosis not present

## 2024-01-30 DIAGNOSIS — E785 Hyperlipidemia, unspecified: Secondary | ICD-10-CM | POA: Diagnosis not present

## 2024-01-30 DIAGNOSIS — Z950 Presence of cardiac pacemaker: Secondary | ICD-10-CM | POA: Diagnosis not present

## 2024-01-30 DIAGNOSIS — I480 Paroxysmal atrial fibrillation: Secondary | ICD-10-CM | POA: Diagnosis not present

## 2024-01-30 DIAGNOSIS — E119 Type 2 diabetes mellitus without complications: Secondary | ICD-10-CM | POA: Diagnosis not present

## 2024-01-30 DIAGNOSIS — I495 Sick sinus syndrome: Secondary | ICD-10-CM | POA: Diagnosis not present

## 2024-01-30 DIAGNOSIS — Z7901 Long term (current) use of anticoagulants: Secondary | ICD-10-CM | POA: Diagnosis not present

## 2024-01-30 MED ORDER — METOPROLOL SUCCINATE ER 25 MG PO TB24: 25.0000 mg | ORAL_TABLET | Freq: Two times a day (BID) | ORAL | 3 refills | Status: AC

## 2024-01-30 MED ORDER — GEMTESA 75 MG PO TABS: 75.0000 mg | ORAL_TABLET | ORAL | Status: DC

## 2024-01-30 NOTE — Plan of Care (Signed)

## 2024-01-30 NOTE — Discharge Summary (Addendum)
 ELECTROPHYSIOLOGY PROCEDURE DISCHARGE SUMMARY    Patient ID: Wendy Barnes,  MRN: [id number], DOB/AGE: July 17, 1941 83 y.o.  Admit date: 01/29/2024 Discharge date: 01/30/2024  Primary Care Physician: Debera Lat, PA-C  Primary Cardiologist: None  Electrophysiologist: Dr. Lalla Brothers   Primary Discharge Diagnosis:  Tachy-brady syndrome, symptomatic bradycardia status post pacemaker implantation this admission  Secondary Discharge Diagnosis:  Sinus node dysfunction Sinus pauses HTN DM HLD   Allergies  Allergen Reactions   Amoxicillin Hives, Itching and Rash    Has patient had a PCN reaction causing immediate rash, facial/tongue/throat swelling, SOB or lightheadedness with hypotension: Yes Has patient had a PCN reaction causing severe rash involving mucus membranes or skin necrosis: Yes Has patient had a PCN reaction that required hospitalization: No Has patient had a PCN reaction occurring within the last 10 years: Yes If all of the above answers are "NO", then may proceed with Cephalosporin use.    Lovastatin Other (See Comments)    myalgias   Penicillins Hives and Rash   Pravastatin Other (See Comments)    Myalgias     Procedures This Admission:  1.  Implantation of a Medtronic dual chamber PPM There were no immediate post procedure complications.   2.  CXR on 01/30/2024 demonstrated no pneumothorax status post device implantation.       Brief HPI: Wendy Barnes is a 83 y.o. female was evaluated by Dr. Lalla Brothers recently in the office with atrial fibrillation. She had had a recent heart monitor through [facility] which showed greater than 10 second pause. She was felt to benefit from pacemaker implantation for tachy-brady syndrome, symptomatic bradycardia, sinus node dysfunction and sinus pauses. Risks, benefits, and alternatives to PPM implantation were reviewed with the patient who wished to proceed.   Hospital Course:  The patient was admitted and underwent  implantation of a Medtronic dual chamber PPM with details as outlined above.  She was monitored on telemetry overnight which demonstrated atrial flutter. This was similar to 01/07/24 EKG. Left chest was without hematoma or ecchymosis.  The device was interrogated and found to be functioning normally.  CXR was obtained and demonstrated no pneumothorax status post device implantation.  Wound care, arm mobility, and restrictions were reviewed with the patient.  The patient was examined and considered stable for discharge to home.  She has wound check appt with [facility] EP APP Thursday Feb 11, 2024.  Anticoagulation resumption This patient should resume their Eliquis on  February 20th per Dr. Lovena Neighbours recommendation.  Discussed verbally with patient.  Dr. Lalla Brothers also increased metoprolol to BID; Dr. Elberta Fortis recommends to continue this new dose at DC. Refill sent to pt's regular pharmacy per our discussion.  Of note, she indicated she takes vibegron every other day now rather than daily Leslye Peer) and that she discussed this with her urologist. Her med list was updated to reflect this without new prescription, advised to discuss with urologist to ensure this gets prescribed correctly on her next refill. Her benazepril/hydrochlorothiazide is also listed as 1 tablet BID and she takes 2 tablets QAM - she received the equivalent of 2 tablets QAM here today so was advised to take next dose tomorrow morning.   Physical Exam: Vitals:   01/29/24 2028 01/29/24 2317 01/30/24 0550 01/30/24 1023  BP: (!) 118/58 112/73 120/81 117/80  Pulse: 74 73 79   Resp: 20 20 12 18   Temp: 98.6 F (37 C) 98.4 F (36.9 C) 98.3 F (36.8 C) 99 F (37.2 C)  TempSrc: Oral Oral  Oral  SpO2: 97% 96% 98%   Weight:      Height:        GEN- NAD. A&O x 3.  HEENT: Normocephalic, atraumatic Lungs- CTAB, Normal effort.  Heart- irr, No M/G/R.  GI- Soft, NT, ND.  Extremities- No clubbing, cyanosis, or edema;  Skin- warm and dry,  no rash or lesion, left chest without hematoma/ecchymosis  Discharge Medications:  Allergies as of 01/30/2024       Reactions   Amoxicillin Hives, Itching, Rash   Has patient had a PCN reaction causing immediate rash, facial/tongue/throat swelling, SOB or lightheadedness with hypotension: Yes Has patient had a PCN reaction causing severe rash involving mucus membranes or skin necrosis: Yes Has patient had a PCN reaction that required hospitalization: No Has patient had a PCN reaction occurring within the last 10 years: Yes If all of the above answers are "NO", then may proceed with Cephalosporin use.   Lovastatin Other (See Comments)   myalgias   Penicillins Hives, Rash   Pravastatin Other (See Comments)   Myalgias        Medication List     PAUSE taking these medications    apixaban 5 MG Tabs tablet Wait to take this until: February 04, 2024 Commonly known as: ELIQUIS Take 5 mg by mouth every 12 (twelve) hours.   benazepril-hydrochlorthiazide 20-12.5 MG tablet Wait to take this until: January 31, 2024 Morning Commonly known as: LOTENSIN HCT TAKE 1 TABLET BY MOUTH TWICE DAILY       TAKE these medications    acetaminophen 500 MG tablet Commonly known as: TYLENOL Take 500-1,000 mg by mouth every 6 (six) hours as needed (pain.).   amLODipine 10 MG tablet Commonly known as: NORVASC Take 1 tablet (10 mg total) by mouth daily.   Biotin 2500 MCG Caps Take 2,500 mcg by mouth in the morning.   Coenzyme Q10 100 MG capsule Take 100 mg by mouth in the morning.   Cranberry Plus Vitamin C 4200-20-3 MG-MG-UNIT Caps Generic drug: Cranberry-Vitamin C-Vitamin E Take 1 capsule by mouth in the morning.   gabapentin 100 MG capsule Commonly known as: NEURONTIN TAKE 1-3 CAPSULES PRIOR TO BED FOR NEUROPATHY What changed:  how much to take how to take this when to take this   Gemtesa 75 MG Tabs Generic drug: Vibegron Take 1 tablet (75 mg total) by mouth every other day.    levothyroxine 88 MCG tablet Commonly known as: SYNTHROID Take 1 tablet (88 mcg total) by mouth daily before breakfast.   metFORMIN 1000 MG tablet Commonly known as: GLUCOPHAGE Take 1 tablet (1,000 mg total) by mouth daily with breakfast. TAKE 1 TABLET BY MOUTH EVERY MORNING WITH BREAKFAST   metoprolol succinate 25 MG 24 hr tablet Commonly known as: TOPROL-XL Take 1 tablet (25 mg total) by mouth in the morning and at bedtime. What changed: when to take this   oxybutynin 10 MG 24 hr tablet Commonly known as: DITROPAN-XL Take 1 tablet (10 mg total) by mouth at bedtime. What changed: when to take this   rosuvastatin 5 MG tablet Commonly known as: CRESTOR Take 1 tablet (5 mg total) by mouth daily.       Per d/w Dr. Elberta Fortis, no venogram done, OK to continue metformin.  Disposition:  Home with usual follow up as in AVS   Duration of Discharge Encounter:  APP time: 16 minutes  Signed, Laurann Montana, PA-C  01/30/2024 12:02 PM  I have seen and examined this patient with  Dayna Dunn.  Agree with above, note added to reflect my findings.  On exam, regular rhythm, no murmurs, lungs clear, device site clean dry and intact.  She is now status post pacemaker implant for atrial fibrillation and symptomatic bradycardia.  Device functioning appropriately.  Sensing, threshold, impedance within normal limits as expected post implant.  Chest x-ray and interrogation without issue.  Plan for discharge today with follow-up in device clinic.  Duration of Discharge Encounter:  MD time: 20 minutes  Glendia Olshefski M. Vanesa Renier MD 01/30/2024 12:04 PM

## 2024-02-01 ENCOUNTER — Encounter (HOSPITAL_COMMUNITY): Payer: Self-pay | Admitting: Cardiology

## 2024-02-01 ENCOUNTER — Telehealth: Payer: Self-pay | Admitting: Cardiology

## 2024-02-01 NOTE — Telephone Encounter (Signed)
 Discussed with patient's daughter. Patient doing well, she knows she should not be using the sling at this point. Follow arm movement guidelines given on discharge sheet.  Pain is being managed with Tylenol. She has follow up with [place] on 02/11/24.

## 2024-02-01 NOTE — Telephone Encounter (Signed)
 Patient's daughter is calling to talk with Dr. Lalla Brothers or nurse in regards to patient's arm being in a sling.

## 2024-02-01 NOTE — Telephone Encounter (Signed)
 Returned the call to the patient's daughter, per the dpr. She stated that the patient was doing well but had questions about the arm sling. She wants to know if the patient can take the sling off when she is sitting and straighten her arm.   The daughter does have the post procedure which they have been following.   Implant was 01/29/24.

## 2024-02-02 ENCOUNTER — Ambulatory Visit: Payer: Self-pay | Admitting: Physician Assistant

## 2024-02-02 MED FILL — Midazolam HCl Inj 2 MG/2ML (Base Equivalent): INTRAMUSCULAR | Qty: 3 | Status: AC

## 2024-02-08 ENCOUNTER — Other Ambulatory Visit: Payer: Self-pay | Admitting: Physician Assistant

## 2024-02-08 DIAGNOSIS — I152 Hypertension secondary to endocrine disorders: Secondary | ICD-10-CM

## 2024-02-08 MED ORDER — BENAZEPRIL-HYDROCHLOROTHIAZIDE 20-12.5 MG PO TABS: 1.0000 | ORAL_TABLET | Freq: Two times a day (BID) | ORAL | 0 refills | Status: DC

## 2024-02-08 NOTE — Telephone Encounter (Unsigned)
 Copied from CRM [id number]. Topic: Clinical - Medication Refill >> Feb 08, 2024 11:05 AM [place] B wrote: Most Recent Primary Care Visit:  Provider: Debera Lat  Department: [id number]  Visit Type: OFFICE VISIT  Date: 12/31/2023  Medication: benazepril-hydrochlorthiazide (LOTENSIN HCT) 20-12.5 MG tablet  Has the patient contacted their pharmacy? no Pt thought she had to call in since says no refills  Is this the correct pharmacy for this prescription? yes This is the patient's preferred pharmacy:  [id number] [place], [place] - [address] AT [facility] [address] [place] [address] Phone: [phone] Fax: [phone]   Has the prescription been filled recently?no  Is the patient out of the medication? No/ has some left  Has the patient been seen for an appointment in the last year OR does the patient have an upcoming appointment? yes  Can we respond through MyChart? yes  Agent: Please be advised that Rx refills may take up to 3 business days. We ask that you follow-up with your pharmacy.

## 2024-02-09 DIAGNOSIS — Z950 Presence of cardiac pacemaker: Secondary | ICD-10-CM | POA: Diagnosis not present

## 2024-02-09 DIAGNOSIS — I480 Paroxysmal atrial fibrillation: Secondary | ICD-10-CM | POA: Diagnosis not present

## 2024-02-09 DIAGNOSIS — I10 Essential (primary) hypertension: Secondary | ICD-10-CM | POA: Diagnosis not present

## 2024-02-11 ENCOUNTER — Encounter: Payer: Self-pay | Admitting: Cardiology

## 2024-02-11 ENCOUNTER — Ambulatory Visit: Payer: Medicare Other | Attending: Cardiology | Admitting: Cardiology

## 2024-02-11 VITALS — BP 128/70 | HR 80 | Ht 63.0 in | Wt 219.5 lb

## 2024-02-11 DIAGNOSIS — I480 Paroxysmal atrial fibrillation: Secondary | ICD-10-CM | POA: Insufficient documentation

## 2024-02-11 DIAGNOSIS — I495 Sick sinus syndrome: Secondary | ICD-10-CM | POA: Insufficient documentation

## 2024-02-11 DIAGNOSIS — Z950 Presence of cardiac pacemaker: Secondary | ICD-10-CM | POA: Diagnosis not present

## 2024-02-11 LAB — CUP PACEART INCLINIC DEVICE CHECK
Date Time Interrogation Session: 20250227160540
Implantable Lead Connection Status: 753985
Implantable Lead Connection Status: 753985
Implantable Lead Implant Date: 20250214
Implantable Lead Implant Date: 20250214
Implantable Lead Location: 753859
Implantable Lead Location: 753860
Implantable Lead Model: 3830
Implantable Lead Model: 5076
Implantable Pulse Generator Implant Date: 20250214

## 2024-02-11 NOTE — Patient Instructions (Signed)
   Medication Instructions:   Your physician recommends that you continue on your current medications as directed. Please refer to the Current Medication list given to you today.  *If you need a refill on your cardiac medications before your next appointment, please call your pharmacy*   Lab Work:  No labs ordered today  If you have labs (blood work) drawn today and your tests are completely normal, you will receive your results only by: MyChart Message (if you have MyChart) OR A paper copy in the mail If you have any lab test that is abnormal or we need to change your treatment, we will call you to review the results.   Testing/Procedures: No test ordered today    Follow-Up: At [facility], you and your health needs are our priority.  As part of our continuing mission to provide you with exceptional heart care, we have created designated Provider Care Teams.  These Care Teams include your primary Cardiologist (physician) and Advanced Practice Providers (APPs -  Physician Assistants and Nurse Practitioners) who all work together to provide you with the care you need, when you need it.  We recommend signing up for the patient portal called "MyChart".  Sign up information is provided on this After Visit Summary.  MyChart is used to connect with patients for Virtual Visits (Telemedicine).  Patients are able to view lab/test results, encounter notes, upcoming appointments, etc.  Non-urgent messages can be sent to your provider as well.   To learn more about what you can do with MyChart, go to [email or web address].    Your next appointment:   Keep your scheduled appointment   Provider:   You may see Lanier Prude, MD or one of the following Advanced Practice Providers on your designated Care Team:   Francis Dowse, Charlott Holler "Mardelle Matte" [place], [place] Sherie Don, NP Canary Brim, NP    Other Instructions  OK to shower Let warm soapy water run down incision Do not  scrub incision Pat dry with towel  Keep incision open to air - no ointments, creams, salves, or bandages.  Call device clinic if incision opens, has drainage, or begins to hurt more.

## 2024-02-11 NOTE — Progress Notes (Addendum)
 Electrophysiology Clinic Note    Date:  02/11/2024  Patient ID:  Wendy Barnes, DOB 1941/10/04, MRN [id number] PCP:  Debera Lat, PA-C  Cardiologist:  None Electrophysiologist: Lanier Prude, MD    Discussed the use of AI scribe software for clinical note transcription with the patient, who gave verbal consent to proceed.   Patient Profile    Chief Complaint: PPM wound check  History of Present Illness: Wendy Barnes is a 83 y.o. female with PMH notable for parox AFib, SND s/p PPM, HTN, T2DM ; seen today for Lanier Prude, MD for routine electrophysiology followup.   She is historically asymptomatic of her afib, though was having dizziness. 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  She is now s/p PPM implant 2/14. She has resumed her eliquis, denies bleeding concerns.    she denies chest pain, palpitations, dyspnea, PND, orthopnea, nausea, vomiting, dizziness, syncope, weight gain, or early satiety.    Her daughter joins for appt, they both have several questions regarding the functionality of her PPM  Device Information: MDT dual chamber PPM, imp 01/2024; dx SND  AAD History: None     ROS:  Please see the history of present illness. All other systems are reviewed and otherwise negative.    Physical Exam    VS:  BP 128/70 (BP Location: Left Arm, Patient Position: Sitting, Cuff Size: Large)   Pulse 80   Ht 5\' 3"  (1.6 m)   Wt 219 lb 8 oz (99.6 kg)   SpO2 98%   BMI 38.88 kg/m  BMI: Body mass index is 38.88 kg/m.  Wt Readings from Last 3 Encounters:  02/11/24 219 lb 8 oz (99.6 kg)  01/29/24 216 lb 7.9 oz (98.2 kg)  01/27/24 218 lb 6.4 oz (99.1 kg)     GEN- The patient is well appearing, alert and oriented x 3 today.   Lungs- Clear to ausculation bilaterally, normal work of breathing.  Heart- Regular rate and rhythm, no murmurs, rubs or gallops Extremities- 1+ peripheral edema, warm, dry Skin-   steri-strips removed easily. Scant drainage noted, wound edges appear healed. Gauze and paper tape applied to area of drainage   Device interrogation done today and reviewed by myself:  Battery 11 years Lead thresholds, impedence, sensing stable  AF burden 25% Extended AV delay to promote intrinsic ventricular  No changes made today   Studies Reviewed   Previous EP, cardiology notes.    EKG is ordered. Personal review of EKG from  01/30/2024  shows:  Aflutter with occasional V-pace Rate 74 Narrow paced QRS      TTE, 01/11/2024 NORMAL LEFT VENTRICULAR SYSTOLIC FUNCTION WITH NO LVH  ESTIMATED EF: >55%  NORMAL LA PRESSURES WITH DIASTOLIC DYSFUNCTION (GRADE 1)  NORMAL RIGHT VENTRICULAR SYSTOLIC FUNCTION  VALVULAR REGURGITATION: TRIVIAL AR, MILD MR, TRIVIAL PR, MODERATE TR  NO VALVULAR STENOSIS     Assessment and Plan     #) Parox AFib #) SND, post-conversion pauses #) PPM in situ She remains asymptomatic of AFib episodes AF burden via device is 25% with good ventricular control Continue eliquis 5mg  BID for stroke ppx Device functioning well, see paceart for details Scant drainage from incision. Gauze and paper tape applied, recommended to remove dressing tonight  Recommended to wait to shower another 3 days, recommend limiting bra usage for another 3 days OK to drive now Reviewed L arm restrictions OK to use watch on L wrist with magnetic clasp  OK to wear jewelry      Current medicines are reviewed at length with the patient today.   The patient does not have concerns regarding her medicines.  The following changes were made today:    Labs/ tests ordered today include:  No orders of the defined types were placed in this encounter.    Disposition: Follow up with Dr. Lalla Brothers or EP APP  3 months    I spent 35 minutes in care of the patient today including wound care, education, reviewing external [facility] notes.    Signed, Sherie Don, NP  02/11/24   3:38 PM  Electrophysiology [facility]

## 2024-02-14 ENCOUNTER — Encounter: Payer: Self-pay | Admitting: Cardiology

## 2024-02-17 ENCOUNTER — Ambulatory Visit (INDEPENDENT_AMBULATORY_CARE_PROVIDER_SITE_OTHER): Payer: Medicare Other | Admitting: Physician Assistant

## 2024-02-17 ENCOUNTER — Encounter: Payer: Self-pay | Admitting: Physician Assistant

## 2024-02-17 VITALS — BP 131/69 | HR 71 | Wt 220.4 lb

## 2024-02-17 DIAGNOSIS — Z950 Presence of cardiac pacemaker: Secondary | ICD-10-CM | POA: Diagnosis not present

## 2024-02-17 DIAGNOSIS — E1169 Type 2 diabetes mellitus with other specified complication: Secondary | ICD-10-CM

## 2024-02-17 DIAGNOSIS — G629 Polyneuropathy, unspecified: Secondary | ICD-10-CM

## 2024-02-17 DIAGNOSIS — I152 Hypertension secondary to endocrine disorders: Secondary | ICD-10-CM | POA: Diagnosis not present

## 2024-02-17 DIAGNOSIS — E1142 Type 2 diabetes mellitus with diabetic polyneuropathy: Secondary | ICD-10-CM

## 2024-02-17 DIAGNOSIS — E1159 Type 2 diabetes mellitus with other circulatory complications: Secondary | ICD-10-CM | POA: Diagnosis not present

## 2024-02-17 DIAGNOSIS — I480 Paroxysmal atrial fibrillation: Secondary | ICD-10-CM | POA: Diagnosis not present

## 2024-02-17 DIAGNOSIS — Z09 Encounter for follow-up examination after completed treatment for conditions other than malignant neoplasm: Secondary | ICD-10-CM

## 2024-02-17 DIAGNOSIS — R5383 Other fatigue: Secondary | ICD-10-CM

## 2024-02-17 DIAGNOSIS — E785 Hyperlipidemia, unspecified: Secondary | ICD-10-CM | POA: Diagnosis not present

## 2024-02-17 DIAGNOSIS — M7989 Other specified soft tissue disorders: Secondary | ICD-10-CM

## 2024-02-17 DIAGNOSIS — E034 Atrophy of thyroid (acquired): Secondary | ICD-10-CM | POA: Diagnosis not present

## 2024-02-17 DIAGNOSIS — N183 Chronic kidney disease, stage 3 unspecified: Secondary | ICD-10-CM

## 2024-02-17 NOTE — Progress Notes (Unsigned)
 Established patient visit  Patient: Wendy Barnes   DOB: 1940-12-17   83 y.o. Female  MRN: [id number] Visit Date: 02/17/2024  Today's healthcare provider: Debera Lat, PA-C   No chief complaint on file.  Subjective       Discussed the use of AI scribe software for clinical note transcription with the patient, who gave verbal consent to proceed.  History of Present Illness          Vaccines      12/31/2023    1:39 PM 10/21/2023    1:05 PM 08/13/2023    8:42 AM  Depression screen PHQ 2/9  Decreased Interest 0 0 0  Down, Depressed, Hopeless 0 0 0  PHQ - 2 Score 0 0 0  Altered sleeping   0  Tired, decreased energy   0  Change in appetite   0  Feeling bad or failure about yourself    0  Trouble concentrating   0  Moving slowly or fidgety/restless   0  Suicidal thoughts   0  PHQ-9 Score   0  Difficult doing work/chores   Not difficult at all      12/31/2023    1:39 PM 10/21/2023    1:06 PM 08/13/2023    8:42 AM  GAD 7 : Generalized Anxiety Score  Nervous, Anxious, on Edge 0 0 0  Control/stop worrying 0 0 0  Worry too much - different things 0 0 0  Trouble relaxing 0 0 0  Restless 0 0 0  Easily annoyed or irritable 0 0 0  Afraid - awful might happen 0 0 0  Total GAD 7 Score 0 0 0  Anxiety Difficulty Not difficult at all Not difficult at all Not difficult at all    Medications: Outpatient Medications Prior to Visit  Medication Sig   acetaminophen (TYLENOL) 500 MG tablet Take 500-1,000 mg by mouth every 6 (six) hours as needed (pain.).   amLODipine (NORVASC) 10 MG tablet Take 1 tablet (10 mg total) by mouth daily.   apixaban (ELIQUIS) 5 MG TABS tablet Take 5 mg by mouth every 12 (twelve) hours.   benazepril-hydrochlorthiazide (LOTENSIN HCT) 20-12.5 MG tablet Take 1 tablet by mouth 2 (two) times daily. (Patient taking differently: Take 2 tablets by mouth daily.)   Biotin 2500 MCG CAPS Take 2,500 mcg by mouth in the morning.   Coenzyme Q10 100 MG capsule Take 100 mg  by mouth in the morning.   Cranberry-Vitamin C-Vitamin E (CRANBERRY PLUS VITAMIN C) 4200-20-3 MG-MG-UNIT CAPS Take 1 capsule by mouth in the morning.   gabapentin (NEURONTIN) 100 MG capsule TAKE 1-3 CAPSULES PRIOR TO BED FOR NEUROPATHY (Patient taking differently: Take 100 mg by mouth at bedtime. TAKE 1-3 CAPSULES PRIOR TO BED FOR NEUROPATHY)   levothyroxine (SYNTHROID) 88 MCG tablet Take 1 tablet (88 mcg total) by mouth daily before breakfast.   metFORMIN (GLUCOPHAGE) 1000 MG tablet Take 1 tablet (1,000 mg total) by mouth daily with breakfast. TAKE 1 TABLET BY MOUTH EVERY MORNING WITH BREAKFAST   metoprolol succinate (TOPROL-XL) 25 MG 24 hr tablet Take 1 tablet (25 mg total) by mouth in the morning and at bedtime.   oxybutynin (DITROPAN-XL) 10 MG 24 hr tablet Take 1 tablet (10 mg total) by mouth at bedtime. (Patient taking differently: Take 10 mg by mouth in the morning.)   rosuvastatin (CRESTOR) 5 MG tablet Take 1 tablet (5 mg total) by mouth daily.   Vibegron (GEMTESA) 75 MG TABS Take 1 tablet (75 mg  total) by mouth every other day.   No facility-administered medications prior to visit.    Review of Systems  All other systems reviewed and are negative.  All negative Except see HPI   {Insert previous labs (optional):23779} {See past labs  Heme  Chem  Endocrine  Serology  Results Review (optional):1}   Objective    There were no vitals taken for this visit. {Insert last BP/Wt (optional):23777}{See vitals history (optional):1}   Physical Exam Vitals reviewed.  Constitutional:      General: She is not in acute distress.    Appearance: Normal appearance. She is well-developed. She is not diaphoretic.  HENT:     Head: Normocephalic and atraumatic.  Eyes:     General: No scleral icterus.    Conjunctiva/sclera: Conjunctivae normal.  Neck:     Thyroid: No thyromegaly.  Cardiovascular:     Rate and Rhythm: Normal rate and regular rhythm.     Pulses: Normal pulses.     Heart  sounds: Normal heart sounds. No murmur heard. Pulmonary:     Effort: Pulmonary effort is normal. No respiratory distress.     Breath sounds: Normal breath sounds. No wheezing, rhonchi or rales.  Musculoskeletal:     Cervical back: Neck supple.     Right lower leg: No edema.     Left lower leg: No edema.  Lymphadenopathy:     Cervical: No cervical adenopathy.  Skin:    General: Skin is warm and dry.     Findings: No rash.  Neurological:     Mental Status: She is alert and oriented to person, place, and time. Mental status is at baseline.  Psychiatric:        Mood and Affect: Mood normal.        Behavior: Behavior normal.      No results found for any visits on 02/17/24.      Assessment and Plan             No orders of the defined types were placed in this encounter.   No follow-ups on file.   The patient was advised to call back or seek an in-person evaluation if the symptoms worsen or if the condition fails to improve as anticipated.  I discussed the assessment and treatment plan with the patient. The patient was provided an opportunity to ask questions and all were answered. The patient agreed with the plan and demonstrated an understanding of the instructions.  I, Debera Lat, PA-C have reviewed all documentation for this visit. The documentation on 02/17/2024  for the exam, diagnosis, procedures, and orders are all accurate and complete.  Debera Lat, [facility], MMS [facility] [phone] (phone) [phone] (fax)  [facility]

## 2024-02-18 DIAGNOSIS — M5417 Radiculopathy, lumbosacral region: Secondary | ICD-10-CM | POA: Diagnosis not present

## 2024-02-18 DIAGNOSIS — M47816 Spondylosis without myelopathy or radiculopathy, lumbar region: Secondary | ICD-10-CM | POA: Diagnosis not present

## 2024-02-18 DIAGNOSIS — G629 Polyneuropathy, unspecified: Secondary | ICD-10-CM | POA: Insufficient documentation

## 2024-02-20 NOTE — Addendum Note (Signed)
 Addended by: Debera Lat on: 02/20/2024 04:44 AM   Modules accepted: Level of Service

## 2024-02-21 NOTE — Addendum Note (Signed)
 Addended by: Debera Lat on: 02/21/2024 04:19 AM   Modules accepted: Level of Service

## 2024-02-22 ENCOUNTER — Telehealth: Payer: Self-pay | Admitting: Physician Assistant

## 2024-02-22 DIAGNOSIS — I152 Hypertension secondary to endocrine disorders: Secondary | ICD-10-CM

## 2024-02-22 DIAGNOSIS — N183 Chronic kidney disease, stage 3 unspecified: Secondary | ICD-10-CM | POA: Diagnosis not present

## 2024-02-22 DIAGNOSIS — E785 Hyperlipidemia, unspecified: Secondary | ICD-10-CM | POA: Diagnosis not present

## 2024-02-22 DIAGNOSIS — E1159 Type 2 diabetes mellitus with other circulatory complications: Secondary | ICD-10-CM | POA: Diagnosis not present

## 2024-02-22 DIAGNOSIS — E1142 Type 2 diabetes mellitus with diabetic polyneuropathy: Secondary | ICD-10-CM | POA: Diagnosis not present

## 2024-02-22 DIAGNOSIS — E1169 Type 2 diabetes mellitus with other specified complication: Secondary | ICD-10-CM | POA: Diagnosis not present

## 2024-02-22 DIAGNOSIS — R5383 Other fatigue: Secondary | ICD-10-CM | POA: Diagnosis not present

## 2024-02-22 MED ORDER — AMLODIPINE BESYLATE 10 MG PO TABS: 10.0000 mg | ORAL_TABLET | Freq: Every day | ORAL | 3 refills | Status: DC

## 2024-02-22 MED ORDER — AMLODIPINE BESYLATE 10 MG PO TABS: 10.0000 mg | ORAL_TABLET | Freq: Every day | ORAL | 3 refills | Status: AC

## 2024-02-22 NOTE — Telephone Encounter (Signed)
[facility] faxed refill request for the following medications: ° °amLODipine (NORVASC) 10 MG tablet  ° ° °Please advise. °

## 2024-02-22 NOTE — Addendum Note (Signed)
 Addended by: Liz Beach on: 02/22/2024 01:42 PM   Modules accepted: Orders

## 2024-02-23 LAB — MICROALBUMIN / CREATININE URINE RATIO
Creatinine, Urine: 115.6 mg/dL
Microalb/Creat Ratio: 8 mg/g{creat} (ref 0–29)
Microalbumin, Urine: 9.8 ug/mL

## 2024-02-23 LAB — BASIC METABOLIC PANEL
BUN/Creatinine Ratio: 18 (ref 12–28)
BUN: 17 mg/dL (ref 8–27)
CO2: 24 mmol/L (ref 20–29)
Calcium: 10.5 mg/dL — ABNORMAL HIGH (ref 8.7–10.3)
Chloride: 103 mmol/L (ref 96–106)
Creatinine, Ser: 0.92 mg/dL (ref 0.57–1.00)
Glucose: 128 mg/dL — ABNORMAL HIGH (ref 70–99)
Potassium: 4.6 mmol/L (ref 3.5–5.2)
Sodium: 142 mmol/L (ref 134–144)
eGFR: 63 mL/min/{1.73_m2} (ref >59)

## 2024-02-23 LAB — CBC WITH DIFFERENTIAL/PLATELET
Basophils Absolute: 0 10*3/uL (ref 0.0–0.2)
Basos: 1 %
EOS (ABSOLUTE): 0.2 10*3/uL (ref 0.0–0.4)
Eos: 2 %
Hematocrit: 40.8 % (ref 34.0–46.6)
Hemoglobin: 13.3 g/dL (ref 11.1–15.9)
Immature Grans (Abs): 0 10*3/uL (ref 0.0–0.1)
Immature Granulocytes: 0 %
Lymphocytes Absolute: 2.5 10*3/uL (ref 0.7–3.1)
Lymphs: 34 %
MCH: 29.3 pg (ref 26.6–33.0)
MCHC: 32.6 g/dL (ref 31.5–35.7)
MCV: 90 fL (ref 79–97)
Monocytes Absolute: 0.5 10*3/uL (ref 0.1–0.9)
Monocytes: 7 %
Neutrophils Absolute: 4.2 10*3/uL (ref 1.4–7.0)
Neutrophils: 56 %
Platelets: 222 10*3/uL (ref 150–450)
RBC: 4.54 x10E6/uL (ref 3.77–5.28)
RDW: 13.6 % (ref 11.7–15.4)
WBC: 7.5 10*3/uL (ref 3.4–10.8)

## 2024-02-23 LAB — LIPID PANEL
Chol/HDL Ratio: 2.9 ratio (ref 0.0–4.4)
Cholesterol, Total: 146 mg/dL (ref 100–199)
HDL: 50 mg/dL (ref >39)
LDL Chol Calc (NIH): 75 mg/dL (ref 0–99)
Triglycerides: 119 mg/dL (ref 0–149)
VLDL Cholesterol Cal: 21 mg/dL (ref 5–40)

## 2024-02-29 ENCOUNTER — Encounter: Payer: Self-pay | Admitting: Physician Assistant

## 2024-03-01 ENCOUNTER — Ambulatory Visit (INDEPENDENT_AMBULATORY_CARE_PROVIDER_SITE_OTHER): Payer: Medicare Other | Admitting: Physician Assistant

## 2024-03-01 ENCOUNTER — Encounter: Payer: Self-pay | Admitting: Physician Assistant

## 2024-03-01 VITALS — BP 161/79 | HR 90 | Ht 64.0 in | Wt 219.0 lb

## 2024-03-01 DIAGNOSIS — N3946 Mixed incontinence: Secondary | ICD-10-CM | POA: Diagnosis not present

## 2024-03-01 LAB — BLADDER SCAN AMB NON-IMAGING: Scan Result: 0

## 2024-03-01 MED ORDER — TROSPIUM CHLORIDE 20 MG PO TABS: 20.0000 mg | ORAL_TABLET | Freq: Two times a day (BID) | ORAL | 11 refills | Status: DC

## 2024-03-01 NOTE — Progress Notes (Signed)
 03/01/2024 1:48 PM   Wendy Barnes [birth year] [id number]  CC: Chief Complaint  Patient presents with   Urinary Incontinence   HPI: Wendy Barnes is a 83 y.o. female with PMH mixed urinary incontinence well-managed on oxybutynin XL 10 mg and Gemtesa samples who presents today for annual follow-up.   Today she reports new diagnosis of A-fib within the last year.  She had a pacemaker placed within the last month.  She is now on Eliquis and metoprolol and is feeling okay.  She remains on oxybutynin XL 10 mg and Gemtesa and her symptoms are well-controlled with this.  She has had no UTIs in the last year, which she attributes to a daily cranberry supplement.  PVR 0mL.  PMH: Past Medical History:  Diagnosis Date   Anxiety    Cancer (HCC)    skin   Hyperchloremia    Hyperlipidemia    Hypertension    Hypothyroidism    Squamous cell carcinoma of skin 09/18/2008   left temple   T2DM (type 2 diabetes mellitus) (HCC)    Thyroid disease    Varicella    Vertigo     Surgical History: Past Surgical History:  Procedure Laterality Date   CATARACT EXTRACTION W/PHACO Left 11/17/2017   Procedure: CATARACT EXTRACTION PHACO AND INTRAOCULAR LENS PLACEMENT (IOC)-LEFT DIABETIC;  Surgeon: Galen Manila, MD;  Location: [facility] ORS;  Service: Ophthalmology;  Laterality: Left;  [place] 00:36 AP% 15.1 CDE 5.46 Fluid pack lot # [id number]   CATARACT EXTRACTION W/PHACO Right 12/22/2017   Procedure: CATARACT EXTRACTION PHACO AND INTRAOCULAR LENS PLACEMENT (IOC);  Surgeon: Galen Manila, MD;  Location: [facility] ORS;  Service: Ophthalmology;  Laterality: Right;  [place] 00:46.1 AP% 12.9 CDE 5.97 Fluid pack lot # [id number]   COLONOSCOPY N/A 06/19/2022   Procedure: COLONOSCOPY;  Surgeon: Jaynie Collins, DO;  Location: [facility] ENDOSCOPY;  Service: Gastroenterology;  Laterality: N/A;  DM   COLONOSCOPY WITH PROPOFOL N/A 04/24/2016   Procedure: COLONOSCOPY WITH PROPOFOL;  Surgeon: Scot Jun, MD;  Location:  [facility] END[facility]COPY;  Service: Endoscopy;  Laterality: N/A;   DILATION AND CURETTAGE OF UTERUS  2002   JOINT REPLACEMENT Right    PACEMAKER IMPLANT N/A 01/29/2024   Procedure: PACEMAKER IMPLANT;  Surgeon: Lanier Prude, MD;  [facility]: [facility];  Service: Cardiovascular;  Laterality: N/A;   REPLACEMENT TOTAL KNEE Right 02/20/2014   THYROIDECTOMY  2010   TONSILLECTOMY     TONSILLECTOMY AND ADENOIDECTOMY  1950   TOTAL KNEE ARTHROPLASTY  2015   right knee    Home Medications:  Allergies as of 03/01/2024       Reactions   Amoxicillin Hives, Itching, Rash   Has patient had a PCN reaction causing immediate rash, facial/tongue/throat swelling, SOB or lightheadedness with hypotension: Yes Has patient had a PCN reaction causing severe rash involving mucus membranes or skin necrosis: Yes Has patient had a PCN reaction that required hospitalization: No Has patient had a PCN reaction occurring within the last 10 years: Yes If all of the above answers are "NO", then may proceed with Cephalosporin use.   Lovastatin Other (See Comments)   myalgias   Penicillins Hives, Rash   Pravastatin Other (See Comments)   Myalgias        Medication List        Accurate as of March 01, 2024  1:48 PM. If you have any questions, ask your nurse or doctor.          acetaminophen 500 MG tablet  Commonly known as: TYLENOL Take 500-1,000 mg by mouth every 6 (six) hours as needed (pain.).   amLODipine 10 MG tablet Commonly known as: NORVASC Take 1 tablet (10 mg total) by mouth daily.   apixaban 5 MG Tabs tablet Commonly known as: ELIQUIS Take 5 mg by mouth every 12 (twelve) hours.   benazepril-hydrochlorthiazide 20-12.5 MG tablet Commonly known as: LOTENSIN HCT Take 1 tablet by mouth 2 (two) times daily. What changed:  how much to take when to take this   Biotin 2500 MCG Caps Take 2,500 mcg by mouth in the morning.   Coenzyme Q10 100 MG capsule Take 100 mg by mouth in the morning.    Cranberry Plus Vitamin C 4200-20-3 MG-MG-UNIT Caps Generic drug: Cranberry-Vitamin C-Vitamin E Take 1 capsule by mouth in the morning.   gabapentin 100 MG capsule Commonly known as: NEURONTIN TAKE 1-3 CAPSULES PRIOR TO BED FOR NEUROPATHY What changed:  how much to take how to take this when to take this   Gemtesa 75 MG Tabs Generic drug: Vibegron Take 1 tablet (75 mg total) by mouth every other day.   levothyroxine 88 MCG tablet Commonly known as: SYNTHROID Take 1 tablet (88 mcg total) by mouth daily before breakfast.   metFORMIN 1000 MG tablet Commonly known as: GLUCOPHAGE Take 1 tablet (1,000 mg total) by mouth daily with breakfast. TAKE 1 TABLET BY MOUTH EVERY MORNING WITH BREAKFAST   metoprolol succinate 25 MG 24 hr tablet Commonly known as: TOPROL-XL Take 1 tablet (25 mg total) by mouth in the morning and at bedtime.   oxybutynin 10 MG 24 hr tablet Commonly known as: DITROPAN-XL Take 1 tablet (10 mg total) by mouth at bedtime. What changed: when to take this   rosuvastatin 5 MG tablet Commonly known as: CRESTOR Take 1 tablet (5 mg total) by mouth daily.        Allergies:  Allergies  Allergen Reactions   Amoxicillin Hives, Itching and Rash    Has patient had a PCN reaction causing immediate rash, facial/tongue/throat swelling, SOB or lightheadedness with hypotension: Yes Has patient had a PCN reaction causing severe rash involving mucus membranes or skin necrosis: Yes Has patient had a PCN reaction that required hospitalization: No Has patient had a PCN reaction occurring within the last 10 years: Yes If all of the above answers are "NO", then may proceed with Cephalosporin use.    Lovastatin Other (See Comments)    myalgias   Penicillins Hives and Rash   Pravastatin Other (See Comments)    Myalgias    Family History: Family History  Problem Relation Age of Onset   Coronary artery disease Mother    Heart attack Mother    Coronary artery disease  Father    Stroke Father    Breast cancer Neg Hx     Social History:   reports that she quit smoking about 28 years ago. Her smoking use included cigarettes. She started smoking about 53 years ago. She has a 25 pack-year smoking history. She has never used smokeless tobacco. She reports current alcohol use. She reports that she does not use drugs.  Physical Exam: BP (!) 161/79   Pulse 90   Ht 5\' 4"  (1.626 m)   Wt 219 lb (99.3 kg)   BMI 37.59 kg/m   Constitutional:  Alert and oriented, no acute distress, nontoxic appearing HEENT: [place], AT Cardiovascular: No clubbing, cyanosis, or edema Respiratory: Normal respiratory effort, no increased work of breathing Skin: No rashes, bruises or  suspicious lesions Neurologic: Grossly intact, no focal deficits, moving all 4 extremities Psychiatric: Normal mood and affect  Laboratory Data: Results for orders placed or performed in visit on 03/01/24  Bladder Scan (Post Void Residual) in office   Collection Time: 03/01/24  1:35 PM  Result Value Ref Range   Scan Result 0    Assessment & Plan:   1. Mixed incontinence (Primary) Continue Gemtesa samples.  We again discussed the risk of cognitive dysfunction/dementia with long-term oxybutynin use.  I do think it would be safer to switch her to trospium from a cognitive standpoint.  Unfortunately, her insurance will not cover it so [facility] and I gave her a GoodRx card today.  If she does well on her new regimen of trospium IR 20 mg twice daily and Gemtesa, will plan to continue this and see her back next year.  If she has side effects or this is ineffective, I will plan to see her sooner and consider third line therapies including PTNS. - Bladder Scan (Post Void Residual) in office - trospium (SANCTURA) 20 MG tablet; Take 1 tablet (20 mg total) by mouth 2 (two) times daily.  Dispense: 60 tablet; Refill: 11   Return in about 1 year (around 03/01/2025) for  Annual OAB f/u with PVR.  Carman Ching, PA-C  [facility] [place] [address] [place], [place] [address] [phone]

## 2024-03-04 ENCOUNTER — Ambulatory Visit: Payer: Self-pay | Admitting: Physician Assistant

## 2024-03-08 ENCOUNTER — Ambulatory Visit: Admitting: Podiatry

## 2024-03-11 ENCOUNTER — Encounter: Payer: Self-pay | Admitting: Podiatry

## 2024-03-11 ENCOUNTER — Ambulatory Visit (INDEPENDENT_AMBULATORY_CARE_PROVIDER_SITE_OTHER): Admitting: Podiatry

## 2024-03-11 DIAGNOSIS — M79675 Pain in left toe(s): Secondary | ICD-10-CM | POA: Diagnosis not present

## 2024-03-11 DIAGNOSIS — B351 Tinea unguium: Secondary | ICD-10-CM

## 2024-03-11 DIAGNOSIS — E119 Type 2 diabetes mellitus without complications: Secondary | ICD-10-CM | POA: Diagnosis not present

## 2024-03-11 DIAGNOSIS — M79674 Pain in right toe(s): Secondary | ICD-10-CM | POA: Diagnosis not present

## 2024-03-11 NOTE — Patient Instructions (Signed)

## 2024-03-14 ENCOUNTER — Ambulatory Visit: Payer: Medicare Other

## 2024-03-14 ENCOUNTER — Telehealth: Payer: Self-pay | Admitting: Physician Assistant

## 2024-03-14 ENCOUNTER — Encounter: Payer: Self-pay | Admitting: Physician Assistant

## 2024-03-14 DIAGNOSIS — I480 Paroxysmal atrial fibrillation: Secondary | ICD-10-CM | POA: Diagnosis not present

## 2024-03-14 DIAGNOSIS — I495 Sick sinus syndrome: Secondary | ICD-10-CM

## 2024-03-14 NOTE — Telephone Encounter (Signed)
 [facility] pharmacy is requesting refill OXYBUTYNIN ER 10MG  tablets Not on current med list Please advise

## 2024-03-15 LAB — CUP PACEART REMOTE DEVICE CHECK
Battery Remaining Longevity: 134 mo
Battery Voltage: 3.21 V
Brady Statistic AP VP Percent: 82.86 %
Brady Statistic AP VS Percent: 10.54 %
Brady Statistic AS VP Percent: 2.31 %
Brady Statistic AS VS Percent: 4.39 %
Brady Statistic RA Percent Paced: 55.64 %
Brady Statistic RV Percent Paced: 70.46 %
Date Time Interrogation Session: 20250331052919
Implantable Lead Connection Status: 753985
Implantable Lead Connection Status: 753985
Implantable Lead Implant Date: 20250214
Implantable Lead Implant Date: 20250214
Implantable Lead Location: 753859
Implantable Lead Location: 753860
Implantable Lead Model: 3830
Implantable Lead Model: 5076
Implantable Pulse Generator Implant Date: 20250214
Lead Channel Impedance Value: 342 Ohm
Lead Channel Impedance Value: 418 Ohm
Lead Channel Impedance Value: 437 Ohm
Lead Channel Impedance Value: 589 Ohm
Lead Channel Pacing Threshold Amplitude: 0.5 V
Lead Channel Pacing Threshold Amplitude: 0.625 V
Lead Channel Pacing Threshold Pulse Width: 0.4 ms
Lead Channel Pacing Threshold Pulse Width: 0.4 ms
Lead Channel Sensing Intrinsic Amplitude: 15.375 mV
Lead Channel Sensing Intrinsic Amplitude: 15.375 mV
Lead Channel Sensing Intrinsic Amplitude: 2.5 mV
Lead Channel Sensing Intrinsic Amplitude: 2.5 mV
Lead Channel Setting Pacing Amplitude: 3.5 V
Lead Channel Setting Pacing Amplitude: 3.5 V
Lead Channel Setting Pacing Pulse Width: 0.4 ms
Lead Channel Setting Sensing Sensitivity: 1.2 mV
Zone Setting Status: 755011
Zone Setting Status: 755011

## 2024-03-15 NOTE — Progress Notes (Signed)
   Chief Complaint  Patient presents with   Ingrown Toenail    Left hallux ingrown/ discoloration on nail    SUBJECTIVE Patient with a history of diabetes mellitus presents to office today complaining of elongated, thickened nails that cause pain while ambulating in shoes.  Patient is unable to trim their own nails.  She is concerned due to some discoloration and possible ingrown portion of nail plate to the left hallux.  Patient is here for further evaluation and treatment.  Past Medical History:  Diagnosis Date   Anxiety    Cancer (HCC)    skin   Hyperchloremia    Hyperlipidemia    Hypertension    Hypothyroidism    Squamous cell carcinoma of skin 09/18/2008   left temple   T2DM (type 2 diabetes mellitus) (HCC)    Thyroid disease    Varicella    Vertigo     Allergies  Allergen Reactions   Amoxicillin Hives, Itching and Rash    Has patient had a PCN reaction causing immediate rash, facial/tongue/throat swelling, SOB or lightheadedness with hypotension: Yes Has patient had a PCN reaction causing severe rash involving mucus membranes or skin necrosis: Yes Has patient had a PCN reaction that required hospitalization: No Has patient had a PCN reaction occurring within the last 10 years: Yes If all of the above answers are "NO", then may proceed with Cephalosporin use.    Lovastatin Other (See Comments)    myalgias   Penicillins Hives and Rash   Pravastatin Other (See Comments)    Myalgias     OBJECTIVE General Patient is awake, alert, and oriented x 3 and in no acute distress. Derm Skin is dry and supple bilateral. Negative open lesions or macerations. Remaining integument unremarkable. Nails are tender, long, thickened and dystrophic with subungual debris, consistent with onychomycosis, 1-5 bilateral. No signs of infection noted. Vasc  DP and PT pedal pulses palpable bilaterally. Temperature gradient within normal limits.  Neuro Epicritic and protective threshold  sensation diminished bilaterally.  Musculoskeletal Exam No symptomatic pedal deformities noted bilateral. Muscular strength within normal limits.  ASSESSMENT 1. Diabetes Mellitus w/ peripheral neuropathy 2.  Pain due to onychomycosis of toenails bilateral 3.  Encounter for diabetic foot exam  PLAN OF CARE 1. Patient evaluated today.  Comprehensive diabetic foot evaluation performed today 2. Instructed to maintain good pedal hygiene and foot care. Stressed importance of controlling blood sugar.  3. Mechanical debridement of nails 1-5 bilaterally performed using a nail nipper. Filed with dremel without incident.  4. Return to clinic in 3 mos.     Felecia Shelling, DPM [facility]  Dr. Felecia Shelling, DPM    [address] [place], [place] [address]                Office [phone]  Fax [phone]

## 2024-03-17 ENCOUNTER — Telehealth: Payer: Self-pay | Admitting: Physician Assistant

## 2024-03-17 DIAGNOSIS — E1142 Type 2 diabetes mellitus with diabetic polyneuropathy: Secondary | ICD-10-CM

## 2024-03-17 MED ORDER — GABAPENTIN 100 MG PO CAPS: ORAL_CAPSULE | ORAL | 1 refills | Status: AC

## 2024-03-17 NOTE — Telephone Encounter (Signed)
 [facility] pharmacy is requesting refill gabapentin (NEURONTIN) 100 MG capsule  Please advise

## 2024-03-18 ENCOUNTER — Encounter: Payer: Self-pay | Admitting: Cardiology

## 2024-03-19 ENCOUNTER — Other Ambulatory Visit: Payer: Self-pay | Admitting: Family Medicine

## 2024-03-19 DIAGNOSIS — E7800 Pure hypercholesterolemia, unspecified: Secondary | ICD-10-CM

## 2024-03-21 DIAGNOSIS — R600 Localized edema: Secondary | ICD-10-CM | POA: Diagnosis not present

## 2024-03-21 DIAGNOSIS — I480 Paroxysmal atrial fibrillation: Secondary | ICD-10-CM | POA: Diagnosis not present

## 2024-03-21 DIAGNOSIS — Z950 Presence of cardiac pacemaker: Secondary | ICD-10-CM | POA: Diagnosis not present

## 2024-03-21 DIAGNOSIS — I10 Essential (primary) hypertension: Secondary | ICD-10-CM | POA: Diagnosis not present

## 2024-03-21 DIAGNOSIS — I872 Venous insufficiency (chronic) (peripheral): Secondary | ICD-10-CM | POA: Diagnosis not present

## 2024-03-21 NOTE — Telephone Encounter (Signed)
 Requested Prescriptions  Pending Prescriptions Disp Refills   rosuvastatin (CRESTOR) 5 MG tablet [Pharmacy Med Name: ROSUVASTATIN 5MG  TABLETS] 90 tablet 0    Sig: TAKE 1 TABLET(5 MG) BY MOUTH DAILY     Cardiovascular:  Antilipid - Statins 2 Failed - 03/21/2024  3:15 PM      Failed - Lipid Panel in normal range within the last 12 months    Cholesterol, Total  Date Value Ref Range Status  02/22/2024 146 100 - 199 mg/dL Final   LDL Chol Calc (NIH)  Date Value Ref Range Status  02/22/2024 75 0 - 99 mg/dL Final   HDL  Date Value Ref Range Status  02/22/2024 50 >39 mg/dL Final   Triglycerides  Date Value Ref Range Status  02/22/2024 119 0 - 149 mg/dL Final         Passed - Cr in normal range and within 360 days    Creatinine  Date Value Ref Range Status  02/22/2014 0.58 (L) 0.60 - 1.30 mg/dL Final   Creatinine, Ser  Date Value Ref Range Status  02/22/2024 0.92 0.57 - 1.00 mg/dL Final         Passed - Patient is not pregnant      Passed - Valid encounter within last 12 months    Recent Outpatient Visits           1 month ago Paroxysmal atrial fibrillation [facility])   [place] [facility] [place], [place], PA-C       Future Appointments             In 1 month [place], [place], PA-C [place] [facility]   In 4 months Ostwalt, [place], PA-C [employer], [facility]   In 5 months Gwen Pounds, [place], MD  [facility]   In 11 months Carman Ching, [facility] [place]

## 2024-03-22 ENCOUNTER — Ambulatory Visit: Payer: Medicare Other | Admitting: Family Medicine

## 2024-03-22 ENCOUNTER — Ambulatory Visit: Payer: Self-pay | Admitting: Physician Assistant

## 2024-04-10 ENCOUNTER — Other Ambulatory Visit: Payer: Self-pay | Admitting: Physician Assistant

## 2024-04-10 DIAGNOSIS — E034 Atrophy of thyroid (acquired): Secondary | ICD-10-CM

## 2024-04-10 DIAGNOSIS — E1142 Type 2 diabetes mellitus with diabetic polyneuropathy: Secondary | ICD-10-CM

## 2024-04-12 NOTE — Telephone Encounter (Signed)
 Requested Prescriptions  Pending Prescriptions Disp Refills   levothyroxine  (SYNTHROID ) 88 MCG tablet [Pharmacy Med Name: LEVOTHYROXINE  0.088MG  ( ) TAB] 90 tablet 0    Sig: TAKE 1 TABLET(88 MCG) BY MOUTH DAILY BEFORE BREAKFAST     Endocrinology:  Hypothyroid Agents Passed - 04/12/2024  9:29 AM      Passed - TSH in normal range and within 360 days    TSH  Date Value Ref Range Status  01/01/2024 3.970 0.450 - 4.500 uIU/mL Final         Passed - Valid encounter within last 12 months    Recent Outpatient Visits           1 month ago Paroxysmal atrial fibrillation (HCC)   [facility] [place], [place], PA-C       Future Appointments             In 1 month Ostwalt, [place], PA-C [place] [facility]   In 4 months [place], [place], PA-C [place] [facility]   In 4 months Elta Halter, MD [place] [facility]   In 10 months [place], [place], [place] [facility] [place]             metFORMIN  (GLUCOPHAGE ) 1000 MG tablet [Pharmacy Med Name: METFORMIN  1000MG  TABLETS] 90 tablet 0    Sig: TAKE 1 TABLET(1000 MG) BY MOUTH EVERY MORNING WITH BREAKFAST     Endocrinology:  Diabetes - Biguanides Failed - 04/12/2024  9:29 AM      Failed - B12 Level in normal range and within 720 days    No results found for: "VITAMINB12"       Passed - Cr in normal range and within 360 days    Creatinine  Date Value Ref Range Status  02/22/2014 0.58 (L) 0.60 - 1.30 mg/dL Final   Creatinine, Ser  Date Value Ref Range Status  02/22/2024 0.92 0.57 - 1.00 mg/dL Final         Passed - HBA1C is between 0 and 7.9 and within 180 days    Hgb A1c MFr Bld  Date Value Ref Range Status  01/01/2024 6.7 (H) 4.8 - 5.6 % Final    Comment:             Prediabetes: 5.7 - 6.4          Diabetes: >6.4          Glycemic control for adults with diabetes: <7.0          Passed - eGFR in normal range and within 360 days     EGFR (African American)  Date Value Ref Range Status  02/22/2014 >60  Final   GFR calc Af Amer  Date Value Ref Range Status  01/02/2021 64 >59 mL/min/1.73 Final    Comment:    **In accordance with recommendations from the NKF-ASN Task force,**   [facility] is in the process of updating its eGFR calculation to the   2021 CKD-EPI creatinine equation that estimates kidney function   without a race variable.    EGFR (Non-African Amer.)  Date Value Ref Range Status  02/22/2014 >60  Final    Comment:    eGFR values <63mL/min/1.73 m2 may be an indication of chronic kidney disease (CKD). Calculated eGFR is useful in patients with stable renal function. The eGFR calculation will not be reliable in acutely ill patients when serum creatinine is changing rapidly. It is not useful in  patients on dialysis.  The eGFR calculation may not be applicable to patients at the low and high extremes of body sizes, pregnant women, and vegetarians.    GFR calc non Af Amer  Date Value Ref Range Status  01/02/2021 55 (L) >59 mL/min/1.73 Final   eGFR  Date Value Ref Range Status  02/22/2024 63 >59 mL/min/1.73 Final         Passed - Valid encounter within last 6 months    Recent Outpatient Visits           1 month ago Paroxysmal atrial fibrillation (HCC)   [place] [facility] [place], [place], PA-C       Future Appointments             In 1 month Ostwalt, [place], PA-C [place] [facility]   In 4 months [place], [place], PA-C [place] [facility]   In 4 months Elta Halter, MD [place] [facility]   In 10 months [place], [place], [place] [facility] [place]            Passed - CBC within normal limits and completed in the last 12 months    WBC  Date Value Ref Range Status  02/22/2024 7.5 3.4 - 10.8 x10E3/uL Final  02/08/2014 8.7 3.6 - 11.0 x10 3/mm 3 Final   RBC  Date Value Ref Range Status   02/22/2024 4.54 3.77 - 5.28 x10E6/uL Final  02/08/2014 4.33 3.80 - 5.20 X10 6/mm 3 Final   Hemoglobin  Date Value Ref Range Status  02/22/2024 13.3 11.1 - 15.9 g/dL Final   Hematocrit  Date Value Ref Range Status  02/22/2024 40.8 34.0 - 46.6 % Final   MCHC  Date Value Ref Range Status  02/22/2024 32.6 31.5 - 35.7 g/dL Final  16/09/9603 54.0 32.0 - 36.0 g/dL Final   [facility]  Date Value Ref Range Status  02/22/2024 29.3 26.6 - 33.0 pg Final  02/08/2014 30.2 26.0 - 34.0 pg Final   MCV  Date Value Ref Range Status  02/22/2024 90 79 - 97 fL Final  02/08/2014 88 80 - 100 fL Final   No results found for: "PLTCOUNTKUC", "LABPLAT", "POCPLA" RDW  Date Value Ref Range Status  02/22/2024 13.6 11.7 - 15.4 % Final  02/08/2014 14.1 11.5 - 14.5 % Final

## 2024-04-28 NOTE — Progress Notes (Signed)
 Remote pacemaker transmission.

## 2024-04-28 NOTE — Addendum Note (Signed)
 Addended by: Edra Govern D on: 04/28/2024 02:41 PM   Modules accepted: Orders

## 2024-05-03 ENCOUNTER — Encounter: Payer: Medicare Other | Admitting: Cardiology

## 2024-05-08 ENCOUNTER — Other Ambulatory Visit: Payer: Self-pay | Admitting: Physician Assistant

## 2024-05-08 DIAGNOSIS — I152 Hypertension secondary to endocrine disorders: Secondary | ICD-10-CM

## 2024-05-08 DIAGNOSIS — E1159 Type 2 diabetes mellitus with other circulatory complications: Secondary | ICD-10-CM

## 2024-05-09 DIAGNOSIS — I495 Sick sinus syndrome: Secondary | ICD-10-CM | POA: Insufficient documentation

## 2024-05-09 NOTE — Progress Notes (Deleted)
 Electrophysiology Clinic Note    Date:  05/09/2024  Patient ID:  Wendy Barnes, DOB [birth year], MRN [id number] PCP:  Blane Bunting, PA-C  Cardiologist:  Janette Medley, MD Electrophysiologist: Boyce Byes, MD    Discussed the use of AI scribe software for clinical note transcription with the patient, who gave verbal consent to proceed.   Patient Profile    Chief Complaint: routine PPM follow up   History of Present Illness: Wendy Barnes is a 83 y.o. female with PMH notable for parox AFib, SND s/p PPM, HTN, T2DM ; seen today for Boyce Byes, MD for routine electrophysiology followup.   She is historically asymptomatic of her afib, though was having dizziness. 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  She is now s/p PPM implant 2/14. She has resumed her eliquis, denies bleeding concerns.   On follow-up today, *** AF burden, symptoms *** palpitations *** bleeding concerns    she denies chest pain, palpitations, dyspnea, PND, orthopnea, nausea, vomiting, dizziness, syncope, weight gain, or early satiety.    Her daughter joins for appt, they both have several questions regarding the functionality of her PPM  Device Information: MDT dual chamber PPM, imp 01/2024; dx SND  AAD History: None     ROS:  Please see the history of present illness. All other systems are reviewed and otherwise negative.    Physical Exam    VS:  There were no vitals taken for this visit. BMI: There is no height or weight on file to calculate BMI.  Wt Readings from Last 3 Encounters:  03/01/24 219 lb (99.3 kg)  02/17/24 220 lb 6.4 oz (100 kg)  02/11/24 219 lb 8 oz (99.6 kg)    *** GEN- The patient is well appearing, alert and oriented x 3 today.   Lungs- Clear to ausculation bilaterally, normal work of breathing.  Heart- Regular rate and rhythm, no murmurs, rubs or gallops Extremities- 1+ peripheral edema, warm,  dry Skin-  steri-strips removed easily. Scant drainage noted, wound edges appear healed. Gauze and paper tape applied to area of drainage  *** Device interrogation done today and reviewed by myself:  Battery 11 years Lead thresholds, impedence, sensing stable  AF burden 25% Extended AV delay to promote intrinsic ventricular  No changes made today   Studies Reviewed   Previous EP, cardiology notes.    EKG is ordered. Personal review of EKG from 01/30/2024 shows:  Aflutter with occasional V-pace Rate 74 Narrow paced QRS      TTE, 01/11/2024 NORMAL LEFT VENTRICULAR SYSTOLIC FUNCTION WITH NO LVH  ESTIMATED EF: >55%  NORMAL LA PRESSURES WITH DIASTOLIC DYSFUNCTION (GRADE 1)  NORMAL RIGHT VENTRICULAR SYSTOLIC FUNCTION  VALVULAR REGURGITATION: TRIVIAL AR, MILD MR, TRIVIAL PR, MODERATE TR  NO VALVULAR STENOSIS     Assessment and Plan     #) Parox AFib #) SND, post-conversion pauses #) PPM in situ She remains asymptomatic of AFib episodes AF burden via device is 25% with good ventricular control Continue eliquis 5mg  BID for stroke ppx Device functioning well, see paceart for details Scant drainage from incision. Gauze and paper tape applied, recommended to remove dressing tonight  Recommended to wait to shower another 3 days, recommend limiting bra usage for another 3 days OK to drive now Reviewed L arm restrictions OK to use watch on L wrist with magnetic clasp OK to wear jewelry      Current medicines are reviewed at  length with the patient today.   The patient does not have concerns regarding her medicines.  The following changes were made today:    Labs/ tests ordered today include:  No orders of the defined types were placed in this encounter.    Disposition: Follow up with Dr. Marven Slimmer or EP APP 3 months    I spent 35 minutes in care of the patient today including wound care, education, reviewing external [facility] notes.    Signed, Bert Ptacek,  NP  05/09/24  1:55 PM  Electrophysiology [facility]

## 2024-05-10 ENCOUNTER — Ambulatory Visit: Admitting: Cardiology

## 2024-05-10 DIAGNOSIS — Z950 Presence of cardiac pacemaker: Secondary | ICD-10-CM

## 2024-05-10 DIAGNOSIS — I495 Sick sinus syndrome: Secondary | ICD-10-CM

## 2024-05-10 DIAGNOSIS — I480 Paroxysmal atrial fibrillation: Secondary | ICD-10-CM

## 2024-05-11 ENCOUNTER — Telehealth: Payer: Self-pay | Admitting: Physician Assistant

## 2024-05-11 NOTE — Telephone Encounter (Signed)
 Patient called to request more samples of Gemtesa . She asked that someone call to let her know if she can come pick them up. [phone]

## 2024-05-12 ENCOUNTER — Telehealth: Payer: Self-pay | Admitting: Physician Assistant

## 2024-05-12 MED ORDER — GEMTESA 75 MG PO TABS: 75.0000 mg | ORAL_TABLET | Freq: Every day | ORAL | Status: DC

## 2024-05-12 NOTE — Telephone Encounter (Signed)
 Pt informed of samples at the front deck, pt voiced understanding.

## 2024-05-12 NOTE — Telephone Encounter (Signed)
 Pt informed of samples at the front desk for her. Pt voiced understanding.

## 2024-05-12 NOTE — Telephone Encounter (Signed)
 Pt called and wanted to know if Sam can leave some more samples of Gemtesa  up front for her. Her number to call when they are ready is [phone]. I asked her if she wanted a prescription fro it and she said no Sam always just leaves her samples.

## 2024-05-13 ENCOUNTER — Ambulatory Visit: Attending: Cardiology | Admitting: Cardiology

## 2024-05-13 ENCOUNTER — Encounter: Payer: Self-pay | Admitting: Cardiology

## 2024-05-13 VITALS — BP 124/60 | HR 71 | Ht 65.0 in | Wt 222.2 lb

## 2024-05-13 DIAGNOSIS — Z950 Presence of cardiac pacemaker: Secondary | ICD-10-CM | POA: Insufficient documentation

## 2024-05-13 DIAGNOSIS — D6869 Other thrombophilia: Secondary | ICD-10-CM | POA: Insufficient documentation

## 2024-05-13 DIAGNOSIS — I480 Paroxysmal atrial fibrillation: Secondary | ICD-10-CM | POA: Insufficient documentation

## 2024-05-13 DIAGNOSIS — I495 Sick sinus syndrome: Secondary | ICD-10-CM | POA: Diagnosis not present

## 2024-05-13 LAB — CUP PACEART INCLINIC DEVICE CHECK
Date Time Interrogation Session: 20250530162241
Implantable Lead Connection Status: 753985
Implantable Lead Connection Status: 753985
Implantable Lead Implant Date: 20250214
Implantable Lead Implant Date: 20250214
Implantable Lead Location: 753859
Implantable Lead Location: 753860
Implantable Lead Model: 3830
Implantable Lead Model: 5076
Implantable Pulse Generator Implant Date: 20250214

## 2024-05-13 MED ORDER — AMIODARONE HCL 200 MG PO TABS: ORAL_TABLET | ORAL | 1 refills | Status: DC

## 2024-05-13 NOTE — Patient Instructions (Signed)
 Medication Instructions:  START Amiodarone 200 mg twice daily for 2 weeks, then decrease to 200 mg daily.   *If you need a refill on your cardiac medications before your next appointment, please call your pharmacy*  Lab Work: Your provider would like for you to have following labs drawn today CMET, MAG, TSH, T4.   If you have labs (blood work) drawn today and your tests are completely normal, you will receive your results only by: MyChart Message (if you have MyChart) OR A paper copy in the mail If you have any lab test that is abnormal or we need to change your treatment, we will call you to review the results.   Follow-Up: At [facility], you and your health needs are our priority.  As part of our continuing mission to provide you with exceptional heart care, our providers are all part of one team.  This team includes your primary Cardiologist (physician) and Advanced Practice Providers or APPs (Physician Assistants and Nurse Practitioners) who all work together to provide you with the care you need, when you need it.  Your next appointment:   4-6 week(s)  Provider:   Suzann Riddle, NP    We recommend signing up for the patient portal called "MyChart".  Sign up information is provided on this After Visit Summary.  MyChart is used to connect with patients for Virtual Visits (Telemedicine).  Patients are able to view lab/test results, encounter notes, upcoming appointments, etc.  Non-urgent messages can be sent to your provider as well.   To learn more about what you can do with MyChart, go to [email or web address].

## 2024-05-13 NOTE — Progress Notes (Signed)
 Electrophysiology Clinic Note    Date:  05/13/2024  Patient ID:  Wendy Barnes, DOB Jan 12, 1941, MRN [id number] PCP:  Blane Bunting, PA-C  Cardiologist:  Janette Medley, MD Electrophysiologist: Boyce Byes, MD   Discussed the use of AI scribe software for clinical note transcription with the patient, who gave verbal consent to proceed.   Patient Profile    Chief Complaint: routine PPM follow up   History of Present Illness: Wendy Barnes is a 83 y.o. female with PMH notable for parox AFib, SND s/p PPM, HTN, T2DM ; seen today for Boyce Byes, MD for routine electrophysiology followup.   She is historically asymptomatic of her afib, though was having dizziness. 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  She is now s/p PPM implant 2/14. She has resumed her eliquis, denies bleeding concerns.   On follow-up today, her daughter joins today who is concerned about patient's increased SOB and "breathing hard" when she is active. The patient agrees that she is not able to do as much as she could because of increased L hip/leg pain d/t sciatica, but does not agree that she is more SOB. The patient is also having increased lower extremity edema. It is less in the morning, and slowly increases throughout the day.  She is not aware of any AF episodes - denies palpitations, chest pain, chest pressure, or sensation of irregular heart beat.   She continues to take eliquis BID, no bleeding concerns. She did inadvertently miss a dose of eliquis about a week ago.    Device Information: MDT dual chamber PPM, imp 01/2024; dx SND  AAD History: None     ROS:  Please see the history of present illness. All other systems are reviewed and otherwise negative.    Physical Exam    VS:  BP 124/60 (BP Location: Left Arm, Patient Position: Sitting, Cuff Size: Normal)   Pulse 71   Ht 5\' 5"  (1.651 m)   Wt 222 lb 3.2 oz (100.8 kg)    SpO2 96%   BMI 36.98 kg/m  BMI: Body mass index is 36.98 kg/m.  Wt Readings from Last 3 Encounters:  05/13/24 222 lb 3.2 oz (100.8 kg)  03/01/24 219 lb (99.3 kg)  02/17/24 220 lb 6.4 oz (100 kg)     GEN- The patient is well appearing, alert and oriented x 3 today.   Lungs- Clear to ausculation bilaterally, normal work of breathing.  Heart- Regular rate and rhythm, no murmurs, rubs or gallops Extremities- 1+ peripheral edema, warm, dry L chest PPM site - c/d/I without edema or tethering    Device interrogation done today and reviewed by myself:  Battery 12.8 years Lead thresholds, impedence, sensing stable  Patient went into Aflutter during device interrogation, and converted back to sinus, and then back to AFlutter AF burden 30% No changes made today   Studies Reviewed   Previous EP, cardiology notes.    EKG is ordered. Personal review of EKG from today shows:   EKG Interpretation Date/Time:  Friday May 13 2024 14:36:15 EDT Ventricular Rate:  71 PR Interval:  242 QRS Duration:  84 QT Interval:  408 QTC Calculation: 443 R Axis:   38  Text Interpretation: intermittent atrial paced with intermittent ventricular paced Confirmed by Vonnetta Akey [phone]) on 05/13/2024 4:03:35 PM    TTE, 01/11/2024 NORMAL LEFT VENTRICULAR SYSTOLIC FUNCTION WITH NO LVH  ESTIMATED EF: >55%  NORMAL LA PRESSURES WITH  DIASTOLIC DYSFUNCTION (GRADE 1)  NORMAL RIGHT VENTRICULAR SYSTOLIC FUNCTION  VALVULAR REGURGITATION: TRIVIAL AR, MILD MR, TRIVIAL PR, MODERATE TR  NO VALVULAR STENOSIS     Assessment and Plan     #) Parox AFib 30% AF burden via PPM Asymptomatic of AFib without palpitations, though query whether patient's increased SOB and/or edema are side effects of Afib Long discussion regarding whether to attempt to maintain sinus rhythm with patient and daughter Discussed AAD medicaiton options - tikosyn vs amiodarone . Patient not interested in loading process for tikosyn Will start  amiodarone  400mg  daily x 2 weeks, then 200mg  daily CMP, mag, thyroid  labs today   #) Hypercoag d/t parox afib CHA2DS2-VASc Score = at least 5 [CHF History: 0, HTN History: 1, Diabetes History: 1, Stroke History: 0, Vascular Disease History: 0, Age Score: 2, Gender Score: 1].  Therefore, the patient's annual risk of stroke is 7.2 %.    Stroke ppx - 5mg  eliquis BID, appropriately dosed No bleeding concerns Encouraged to use reminders or alarms to remember medications  #) SND, post-conversion pauses #) PPM in situ Device functioning well, see paceart for details     Current medicines are reviewed at length with the patient today.   The patient does not have concerns regarding her medicines.  The following changes were made today:   START amiodarone  400mg  daily x 2 weeks, then 200mg  daily  Labs/ tests ordered today include:  Orders Placed This Encounter  Procedures   Comprehensive metabolic panel with GFR   Magnesium   TSH   T4, free   EKG 12-Lead     Disposition: Follow up with Dr. Marven Slimmer or EP APP in 4-6 weeks     Signed, Excell Neyland, NP  05/13/24  4:06 PM  Electrophysiology [facility]

## 2024-05-14 LAB — COMPREHENSIVE METABOLIC PANEL WITH GFR
ALT: 18 IU/L (ref 0–32)
AST: 21 IU/L (ref 0–40)
Albumin: 4.3 g/dL (ref 3.8–4.8)
Alkaline Phosphatase: 95 IU/L (ref 44–121)
BUN/Creatinine Ratio: 12 (ref 12–28)
BUN: 13 mg/dL (ref 8–27)
Bilirubin Total: 0.5 mg/dL (ref 0.0–1.2)
CO2: 19 mmol/L — ABNORMAL LOW (ref 20–29)
Calcium: 10.1 mg/dL (ref 8.7–10.3)
Chloride: 103 mmol/L (ref 96–106)
Creatinine, Ser: 1.07 mg/dL — ABNORMAL HIGH (ref 0.57–1.00)
Globulin, Total: 2.4 g/dL (ref 1.5–4.5)
Glucose: 131 mg/dL — ABNORMAL HIGH (ref 70–99)
Potassium: 4.3 mmol/L (ref 3.5–5.2)
Sodium: 140 mmol/L (ref 134–144)
Total Protein: 6.7 g/dL (ref 6.0–8.5)
eGFR: 53 mL/min/{1.73_m2} — ABNORMAL LOW (ref >59)

## 2024-05-14 LAB — MAGNESIUM: Magnesium: 1.8 mg/dL (ref 1.6–2.3)

## 2024-05-14 LAB — T4, FREE: Free T4: 1.43 ng/dL (ref 0.82–1.77)

## 2024-05-14 LAB — TSH: TSH: 3.44 u[IU]/mL (ref 0.450–4.500)

## 2024-05-15 ENCOUNTER — Ambulatory Visit: Payer: Self-pay | Admitting: Cardiology

## 2024-05-19 ENCOUNTER — Ambulatory Visit: Admitting: Physician Assistant

## 2024-05-19 ENCOUNTER — Encounter: Payer: Self-pay | Admitting: Physician Assistant

## 2024-05-19 VITALS — BP 129/75 | HR 80 | Resp 16 | Ht 64.0 in | Wt 222.0 lb

## 2024-05-19 DIAGNOSIS — N3281 Overactive bladder: Secondary | ICD-10-CM | POA: Diagnosis not present

## 2024-05-19 DIAGNOSIS — I152 Hypertension secondary to endocrine disorders: Secondary | ICD-10-CM | POA: Diagnosis not present

## 2024-05-19 DIAGNOSIS — I480 Paroxysmal atrial fibrillation: Secondary | ICD-10-CM | POA: Diagnosis not present

## 2024-05-19 DIAGNOSIS — E1159 Type 2 diabetes mellitus with other circulatory complications: Secondary | ICD-10-CM

## 2024-05-19 DIAGNOSIS — E785 Hyperlipidemia, unspecified: Secondary | ICD-10-CM | POA: Diagnosis not present

## 2024-05-19 DIAGNOSIS — G629 Polyneuropathy, unspecified: Secondary | ICD-10-CM

## 2024-05-19 DIAGNOSIS — N183 Chronic kidney disease, stage 3 unspecified: Secondary | ICD-10-CM | POA: Diagnosis not present

## 2024-05-19 DIAGNOSIS — E1142 Type 2 diabetes mellitus with diabetic polyneuropathy: Secondary | ICD-10-CM | POA: Diagnosis not present

## 2024-05-19 DIAGNOSIS — E034 Atrophy of thyroid (acquired): Secondary | ICD-10-CM

## 2024-05-19 DIAGNOSIS — E1169 Type 2 diabetes mellitus with other specified complication: Secondary | ICD-10-CM | POA: Diagnosis not present

## 2024-05-19 DIAGNOSIS — M7989 Other specified soft tissue disorders: Secondary | ICD-10-CM

## 2024-05-19 NOTE — Progress Notes (Signed)
 Established patient visit  Patient: Wendy Barnes   DOB: 04/15/1941   82 y.o. Female  MRN: [id number] Visit Date: 05/19/2024  Today's healthcare provider: Blane Bunting, PA-C   Chief Complaint  Patient presents with   Follow-up    F/u on Chronic Disease. No other concerns    Subjective     HPI     Follow-up    Additional comments: F/u on Chronic Disease. No other concerns       Last edited by Estill Hemming, CMA on 05/19/2024  9:01 AM.       Discussed the use of AI scribe software for clinical note transcription with the patient, who gave verbal consent to proceed.  History of Present Illness Gregory Dowe is an 83 year old female with atrial fibrillation and hypertension who presents for follow-up of her chronic conditions.  She experiences intermittent atrial fibrillation, with episodes occurring about 30% of the time. Her medication regimen includes Eliquis 5 mg twice daily, metoprolol , and an additional medication for atrial fibrillation/amiodarone . She also experiences leg swelling, which had decreased but has returned, and she uses compression stockings for management.  Hypertension is managed with benazepril  20 mg twice daily and hydrochlorothiazide  12.5 mg twice daily. She also takes Crestor  5 mg for cholesterol management.  Diabetes is managed with metformin  1000 mg. Her recent blood sugar was slightly elevated at 131 mg/dL, and an Q2V test is planned to assess long-term glucose control.  She experiences difficulty walking long distances and limps on her left side, possibly due to sciatic nerve issues. She has an appointment for physical therapy on June 30th.  She takes Gemtesa  75 mg for overactive bladder, which is effective. Gabapentin  has been increased to two tablets at night for neuropathy, with improvement in nighttime pain.  Her kidney function has been slightly declining, and she is advised to avoid NSAIDs and maintain adequate hydration without causing  edema.  She plans to get a shingles vaccine soon and recently received an RSV shot. Bowel movements occur every other day, consistent with her long-term pattern.       05/19/2024    9:12 AM 12/31/2023    1:39 PM 10/21/2023    1:05 PM  Depression screen PHQ 2/9  Decreased Interest 0 0 0  Down, Depressed, Hopeless 0 0 0  PHQ - 2 Score 0 0 0  Altered sleeping 0    Tired, decreased energy 0    Change in appetite 0    Feeling bad or failure about yourself  0    Trouble concentrating 0    Moving slowly or fidgety/restless 0    Suicidal thoughts 0    PHQ-9 Score 0    Difficult doing work/chores Not difficult at all        05/19/2024    9:12 AM 12/31/2023    1:39 PM 10/21/2023    1:06 PM 08/13/2023    8:42 AM  GAD 7 : Generalized Anxiety Score  Nervous, Anxious, on Edge 0 0 0 0  Control/stop worrying 0 0 0 0  Worry too much - different things 0 0 0 0  Trouble relaxing 0 0 0 0  Restless 0 0 0 0  Easily annoyed or irritable 0 0 0 0  Afraid - awful might happen 0 0 0 0  Total GAD 7 Score 0 0 0 0  Anxiety Difficulty Not difficult at all Not difficult at all Not difficult at all Not difficult at all    Medications: Outpatient  Medications Prior to Visit  Medication Sig   acetaminophen  (TYLENOL ) 500 MG tablet Take 500-1,000 mg by mouth every 6 (six) hours as needed (pain.).   amiodarone  (PACERONE ) 200 MG tablet Take 1 tablet (200 mg) by mouth twice daily for 2 weeks, then take 1 tablet (200 mg) by mouth daily   amLODipine  (NORVASC ) 10 MG tablet Take 1 tablet (10 mg total) by mouth daily.   apixaban (ELIQUIS) 5 MG TABS tablet Take 5 mg by mouth every 12 (twelve) hours.   benazepril -hydrochlorthiazide (LOTENSIN  HCT) 20-12.5 MG tablet TAKE 1 TABLET BY MOUTH TWICE DAILY   Biotin 2500 MCG CAPS Take 2,500 mcg by mouth in the morning.   Coenzyme Q10 100 MG capsule Take 100 mg by mouth in the morning.   Cranberry-Vitamin C-Vitamin E (CRANBERRY PLUS VITAMIN C) 4200-20-3 MG-MG-UNIT CAPS Take 1  capsule by mouth in the morning.   gabapentin  (NEURONTIN ) 100 MG capsule TAKE 1-3 CAPSULES PRIOR TO BED FOR NEUROPATHY   levocetirizine (XYZAL ) 5 MG tablet Take 5 mg by mouth daily.   levothyroxine  (SYNTHROID ) 88 MCG tablet TAKE 1 TABLET(88 MCG) BY MOUTH DAILY BEFORE BREAKFAST   metFORMIN  (GLUCOPHAGE ) 1000 MG tablet TAKE 1 TABLET(1000 MG) BY MOUTH EVERY MORNING WITH BREAKFAST   metoprolol  succinate (TOPROL -XL) 25 MG 24 hr tablet Take 1 tablet (25 mg total) by mouth in the morning and at bedtime.   rosuvastatin  (CRESTOR ) 5 MG tablet TAKE 1 TABLET(5 MG) BY MOUTH DAILY   trospium  (SANCTURA ) 20 MG tablet Take 1 tablet (20 mg total) by mouth 2 (two) times daily.   Vibegron  (GEMTESA ) 75 MG TABS Take 1 tablet (75 mg total) by mouth every other day.   No facility-administered medications prior to visit.    Review of Systems All negative Except see HPI       Objective    BP 129/75 (BP Location: Right Arm, Patient Position: Sitting)   Pulse 80   Resp 16   Ht 5\' 4"  (1.626 m)   Wt 222 lb (100.7 kg)   SpO2 100%   BMI 38.11 kg/m     Physical Exam Vitals reviewed.  Constitutional:      General: She is not in acute distress.    Appearance: Normal appearance. She is well-developed. She is obese. She is not diaphoretic.  HENT:     Head: Normocephalic and atraumatic.  Eyes:     General: No scleral icterus.    Conjunctiva/sclera: Conjunctivae normal.  Neck:     Thyroid : No thyromegaly.  Cardiovascular:     Rate and Rhythm: Normal rate and regular rhythm.     Pulses: Normal pulses.     Heart sounds: Normal heart sounds. No murmur heard. Pulmonary:     Effort: Pulmonary effort is normal. No respiratory distress.     Breath sounds: Normal breath sounds. No wheezing, rhonchi or rales.  Musculoskeletal:     Cervical back: Neck supple.     Right lower leg: No edema.     Left lower leg: No edema.  Lymphadenopathy:     Cervical: No cervical adenopathy.  Skin:    General: Skin is warm  and dry.     Findings: No rash.  Neurological:     Mental Status: She is alert and oriented to person, place, and time. Mental status is at baseline.  Psychiatric:        Mood and Affect: Mood normal.        Behavior: Behavior normal.      No  results found for any visits on 05/19/24.      Assessment & Plan Atrial Fibrillation Intermittent atrial fibrillation managed with Eliquis and metoprolol . Amiodarone  was added for atrial fibrillation and leg swelling. - Continue Eliquis 5 mg twice daily. - Continue metoprolol . - Monitor response to new medication. Will follow-up  Hypertension Chronic  Hypertension managed with benazepril  and hydrochlorothiazide . Blood pressure control essential for cardiovascular and renal health. - Continue benazepril  20 mg twice daily. - Continue hydrochlorothiazide  12.5 mg twice daily. - Measure blood pressure during the visit. Advised to continue with low salt diet and regular exercise Will follow-up  Hyperlipidemia Chronic Continue crestor  5mg  Last ldl 75/from 02/22/24 Will follow-up Continue lifestyle modifications  Chronic Kidney Disease Slightly decreased kidney function. GFR from 05/13/24 was 53. Advised to avoid NSAIDs and maintain hydration. Blood pressure medication aids kidney protection. - Avoid NSAIDs. - Maintain balanced hydration. - Monitor kidney function. Will follow-up  Diabetes Mellitus Chronic and stable Diabetes managed with metformin . Recent blood sugar 131 mg/dL. A1c test planned for glucose control assessment. - Continue metformin  1000 mg. - Order A1c test. 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  Left-sided pain likely sciatica affecting mobility. Physical therapy planned. Advised cautious activity due to hip concerns. - Attend physical therapy appointment on June 30th.Increased gabapentin  improved symptoms. - Continue  gabapentin , two tablets at night.  Overactive Bladder Overactive bladder managed with Gemtesa  75 mg. Recent switch improved urinary frequency. - Continue Gemtesa  75 mg. Will follow-up with urology  Hypothyroidism Chronic Hypothyroidism managed with Synthroid  88 mcg without issues. Last TSH wnl - Continue Synthroid  88 mcg. Will follow-up  General Health Maintenance Due for shingles and tetanus vaccinations. Received RSV vaccine. Plans to schedule shingles vaccine and inquire about tetanus vaccine. Advised to update vaccination records. - Schedule shingles vaccine at [facility]. - Inquire about tetanus vaccine. - Bring vaccination records for chart update.  Follow-up Regular follow-up necessary due to complex conditions. Next appointment in September. A1c results to be reviewed then. - Schedule follow-up appointment in September. - Review A1c results at next visit.    Orders Placed This Encounter  Procedures   Hemoglobin A1c    No follow-ups on file.   The patient was advised to call back or seek an in-person evaluation if the symptoms worsen or if the condition fails to improve as anticipated.  I discussed the assessment and treatment plan with the patient. The patient was provided an opportunity to ask questions and all were answered. The patient agreed with the plan and demonstrated an understanding of the instructions.  I, Ashiah Karpowicz, PA-C have reviewed all documentation for this visit. The documentation on 05/19/2024  for the exam, diagnosis, procedures, and orders are all accurate and complete.  Blane Bunting, [facility], MMS [facility] [phone] (phone) [phone] (fax)  [facility]

## 2024-05-20 DIAGNOSIS — E1142 Type 2 diabetes mellitus with diabetic polyneuropathy: Secondary | ICD-10-CM | POA: Diagnosis not present

## 2024-05-21 LAB — HEMOGLOBIN A1C
Est. average glucose Bld gHb Est-mCnc: 137 mg/dL
Hgb A1c MFr Bld: 6.4 % — ABNORMAL HIGH (ref 4.8–5.6)

## 2024-05-23 ENCOUNTER — Ambulatory Visit: Payer: Self-pay | Admitting: Physician Assistant

## 2024-06-10 DIAGNOSIS — M47816 Spondylosis without myelopathy or radiculopathy, lumbar region: Secondary | ICD-10-CM | POA: Diagnosis not present

## 2024-06-10 DIAGNOSIS — M6281 Muscle weakness (generalized): Secondary | ICD-10-CM | POA: Diagnosis not present

## 2024-06-12 NOTE — Progress Notes (Unsigned)
 Electrophysiology Clinic Note    Date:  06/13/2024  Patient ID:  Wendy Barnes, DOB 11-07-1941, MRN [id number] PCP:  Dineen Channel, PA-C  Cardiologist:  Keller JAYSON Paterson, MD Electrophysiologist: OLE ONEIDA HOLTS, MD   Discussed the use of AI scribe software for clinical note transcription with the patient, who gave verbal consent to proceed.   Patient Profile    Chief Complaint: routine PPM follow up   History of Present Illness: Wendy Barnes is a 83 y.o. female with PMH notable for parox AFib, SND s/p PPM, HTN, T2DM ; seen today for OLE ONEIDA HOLTS, MD for routine electrophysiology followup.   She is historically asymptomatic of her afib, though was having dizziness. 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  She is now s/p PPM implant 2/14. She has resumed her eliquis, denies bleeding concerns.   I saw her for routine 91 day post-device implant where her daughter was concerned about patient's increased SOB and breathing hard when active, also having increased lower extremity edema. Afib burden about 30% by device, so we agreed to initiate amiodarone .   On follow-up today, she believes her DOE is better than at her previous appointment.  She continues to be asymptomatic from A-fib.  She denies chest pain, chest pressure, palpitations.  She does continue to have intermittent lower extremity edema, but this has also slightly improved since her last visit.  She continues to take Eliquis twice daily without bleeding concerns.    Device Information: MDT dual chamber PPM, imp 01/2024; dx SND  AAD History: Amiodarone      ROS:  Please see the history of present illness. All other systems are reviewed and otherwise negative.    Physical Exam    VS:  BP 130/68 (BP Location: Left Arm, Patient Position: Sitting, Cuff Size: Large)   Pulse 64   Ht 5' 5 (1.651 m)   Wt 223 lb 12.8 oz (101.5 kg)   SpO2 98%   BMI 37.24  kg/m  BMI: Body mass index is 37.24 kg/m.  Wt Readings from Last 3 Encounters:  06/13/24 223 lb 12.8 oz (101.5 kg)  05/19/24 222 lb (100.7 kg)  05/13/24 222 lb 3.2 oz (100.8 kg)     GEN- The patient is well appearing, alert and oriented x 3 today.   Lungs- Clear to ausculation bilaterally, normal work of breathing.  Heart- Regular rate and rhythm, no murmurs, rubs or gallops Extremities- 1+ peripheral edema, warm, dry L chest PPM site - c/d/I without edema or tethering   Device interrogation done today and reviewed by myself:  Battery 13.1 years Lead thresholds, impedence, sensing stable  AF burden 30% No changes made today   Studies Reviewed   Previous EP, cardiology notes.    EKG is ordered. Personal review of EKG from today shows:   EKG Interpretation Date/Time:  Monday June 13 2024 13:36:01 EDT Ventricular Rate:  64 PR Interval:  284 QRS Duration:  82 QT Interval:  442 QTC Calculation: 455 R Axis:   34  Text Interpretation: Atrial-paced rhythm with prolonged AV conduction Confirmed by Kelven Flater [phone]) on 06/13/2024 1:42:36 PM    TTE, 01/11/2024 NORMAL LEFT VENTRICULAR SYSTOLIC FUNCTION WITH NO LVH  ESTIMATED EF: >55%  NORMAL LA PRESSURES WITH DIASTOLIC DYSFUNCTION (GRADE 1)  NORMAL RIGHT VENTRICULAR SYSTOLIC FUNCTION  VALVULAR REGURGITATION: TRIVIAL AR, MILD MR, TRIVIAL PR, MODERATE TR  NO VALVULAR STENOSIS     Assessment and Plan     #)  Parox AFib Continues to have elevated AF burden via PPM about 30%  No off-target effects of amiodarone  Continue 200mg  amiodarone  daily Update LFT, thyroid  labs today  #) Hypercoag d/t parox afib CHA2DS2-VASc Score = at least 5 [CHF History: 0, HTN History: 1, Diabetes History: 1, Stroke History: 0, Vascular Disease History: 0, Age Score: 2, Gender Score: 1].  Therefore, the patient's annual risk of stroke is 7.2 %.    Stroke ppx - 5mg  eliquis BID, appropriately dosed No bleeding concerns   #) SND,  post-conversion pauses #) PPM in situ Device functioning well, see paceart for details     Current medicines are reviewed at length with the patient today.   The patient does not have concerns regarding her medicines.  The following changes were made today:   none  Labs/ tests ordered today include:  Orders Placed This Encounter  Procedures   Hepatic function panel   TSH   T4, free   EKG 12-Lead     Disposition: Follow up with Dr. Cindie or EP APP in 3 months    Signed, Khala Tarte, NP  06/13/24  4:42 PM  Electrophysiology [facility]

## 2024-06-13 ENCOUNTER — Ambulatory Visit: Attending: Cardiology | Admitting: Cardiology

## 2024-06-13 ENCOUNTER — Ambulatory Visit: Payer: Medicare Other

## 2024-06-13 VITALS — BP 130/68 | HR 64 | Ht 65.0 in | Wt 223.8 lb

## 2024-06-13 DIAGNOSIS — I480 Paroxysmal atrial fibrillation: Secondary | ICD-10-CM | POA: Diagnosis present

## 2024-06-13 DIAGNOSIS — Z79899 Other long term (current) drug therapy: Secondary | ICD-10-CM | POA: Diagnosis not present

## 2024-06-13 DIAGNOSIS — I495 Sick sinus syndrome: Secondary | ICD-10-CM

## 2024-06-13 DIAGNOSIS — Z950 Presence of cardiac pacemaker: Secondary | ICD-10-CM | POA: Insufficient documentation

## 2024-06-13 MED ORDER — FUROSEMIDE 20 MG PO TABS: 20.0000 mg | ORAL_TABLET | ORAL | 3 refills | Status: AC | PRN

## 2024-06-13 NOTE — Patient Instructions (Addendum)
 Medication Instructions:   Your physician recommends the following medication changes.  START TAKING: Lasix 20 mg by mouth as needed for increased lower extremity edema  *If you need a refill on your cardiac medications before your next appointment, please call your pharmacy*  Lab Work:  Your provider would like for you to have following labs drawn today LFT, TSH, T4 Free.    If you have labs (blood work) drawn today and your tests are completely normal, you will receive your results only by: MyChart Message (if you have MyChart) OR A paper copy in the mail If you have any lab test that is abnormal or we need to change your treatment, we will call you to review the results.  Testing/Procedures:  No tests ordered today   Follow-Up: At [facility], you and your health needs are our priority.  As part of our continuing mission to provide you with exceptional heart care, our providers are all part of one team.  This team includes your primary Cardiologist (physician) and Advanced Practice Providers or APPs (Physician Assistants and Nurse Practitioners) who all work together to provide you with the care you need, when you need it.  Your next appointment:   3 month(s)  Provider:   You may see Dr. Ole Holts   Or Suzann Riddle, NP

## 2024-06-14 ENCOUNTER — Ambulatory Visit: Payer: Self-pay | Admitting: Cardiology

## 2024-06-14 DIAGNOSIS — H0011 Chalazion right upper eyelid: Secondary | ICD-10-CM | POA: Diagnosis not present

## 2024-06-14 DIAGNOSIS — H0014 Chalazion left upper eyelid: Secondary | ICD-10-CM | POA: Diagnosis not present

## 2024-06-14 LAB — CUP PACEART REMOTE DEVICE CHECK
Battery Remaining Longevity: 157 mo
Battery Voltage: 3.18 V
Brady Statistic AP VP Percent: 0.89 %
Brady Statistic AP VS Percent: 95.19 %
Brady Statistic AS VP Percent: 0.24 %
Brady Statistic AS VS Percent: 3.67 %
Brady Statistic RA Percent Paced: 68.73 %
Brady Statistic RV Percent Paced: 16.59 %
Date Time Interrogation Session: 20250630063142
Implantable Lead Connection Status: 753985
Implantable Lead Connection Status: 753985
Implantable Lead Implant Date: 20250214
Implantable Lead Implant Date: 20250214
Implantable Lead Location: 753859
Implantable Lead Location: 753860
Implantable Lead Model: 3830
Implantable Lead Model: 5076
Implantable Pulse Generator Implant Date: 20250214
Lead Channel Impedance Value: 323 Ohm
Lead Channel Impedance Value: 418 Ohm
Lead Channel Impedance Value: 437 Ohm
Lead Channel Impedance Value: 551 Ohm
Lead Channel Pacing Threshold Amplitude: 0.625 V
Lead Channel Pacing Threshold Amplitude: 0.75 V
Lead Channel Pacing Threshold Pulse Width: 0.4 ms
Lead Channel Pacing Threshold Pulse Width: 0.4 ms
Lead Channel Sensing Intrinsic Amplitude: 15.25 mV
Lead Channel Sensing Intrinsic Amplitude: 15.25 mV
Lead Channel Sensing Intrinsic Amplitude: 4.5 mV
Lead Channel Sensing Intrinsic Amplitude: 4.5 mV
Lead Channel Setting Pacing Amplitude: 1.5 V
Lead Channel Setting Pacing Amplitude: 1.5 V
Lead Channel Setting Pacing Pulse Width: 0.4 ms
Lead Channel Setting Sensing Sensitivity: 1.2 mV
Zone Setting Status: 755011
Zone Setting Status: 755011

## 2024-06-14 LAB — HEPATIC FUNCTION PANEL
ALT: 16 IU/L (ref 0–32)
AST: 17 IU/L (ref 0–40)
Albumin: 4.4 g/dL (ref 3.8–4.8)
Alkaline Phosphatase: 84 IU/L (ref 44–121)
Bilirubin Total: 0.4 mg/dL (ref 0.0–1.2)
Bilirubin, Direct: 0.16 mg/dL (ref 0.00–0.40)
Total Protein: 6.9 g/dL (ref 6.0–8.5)

## 2024-06-14 LAB — T4, FREE: Free T4: 1.57 ng/dL (ref 0.82–1.77)

## 2024-06-14 LAB — TSH: TSH: 4.51 u[IU]/mL — ABNORMAL HIGH (ref 0.450–4.500)

## 2024-06-15 ENCOUNTER — Ambulatory Visit: Payer: Self-pay | Admitting: Cardiology

## 2024-06-16 DIAGNOSIS — M6281 Muscle weakness (generalized): Secondary | ICD-10-CM | POA: Diagnosis not present

## 2024-06-16 DIAGNOSIS — M47816 Spondylosis without myelopathy or radiculopathy, lumbar region: Secondary | ICD-10-CM | POA: Diagnosis not present

## 2024-06-21 DIAGNOSIS — Z96651 Presence of right artificial knee joint: Secondary | ICD-10-CM | POA: Diagnosis not present

## 2024-06-22 ENCOUNTER — Other Ambulatory Visit: Payer: Self-pay | Admitting: Physician Assistant

## 2024-06-22 DIAGNOSIS — M6281 Muscle weakness (generalized): Secondary | ICD-10-CM | POA: Diagnosis not present

## 2024-06-22 DIAGNOSIS — Z1231 Encounter for screening mammogram for malignant neoplasm of breast: Secondary | ICD-10-CM

## 2024-06-22 DIAGNOSIS — E7800 Pure hypercholesterolemia, unspecified: Secondary | ICD-10-CM

## 2024-06-22 DIAGNOSIS — M47816 Spondylosis without myelopathy or radiculopathy, lumbar region: Secondary | ICD-10-CM | POA: Diagnosis not present

## 2024-06-24 DIAGNOSIS — M47816 Spondylosis without myelopathy or radiculopathy, lumbar region: Secondary | ICD-10-CM | POA: Diagnosis not present

## 2024-06-28 DIAGNOSIS — M47816 Spondylosis without myelopathy or radiculopathy, lumbar region: Secondary | ICD-10-CM | POA: Diagnosis not present

## 2024-06-30 DIAGNOSIS — M6281 Muscle weakness (generalized): Secondary | ICD-10-CM | POA: Diagnosis not present

## 2024-06-30 DIAGNOSIS — M47816 Spondylosis without myelopathy or radiculopathy, lumbar region: Secondary | ICD-10-CM | POA: Diagnosis not present

## 2024-07-07 ENCOUNTER — Telehealth: Payer: Self-pay | Admitting: Physician Assistant

## 2024-07-07 NOTE — Telephone Encounter (Signed)
 Pt called and would like more samples of Gemtesa , she is out.  It looks like Sam has been giving her samples in the past.

## 2024-07-08 ENCOUNTER — Other Ambulatory Visit: Payer: Self-pay | Admitting: Physician Assistant

## 2024-07-08 DIAGNOSIS — E1142 Type 2 diabetes mellitus with diabetic polyneuropathy: Secondary | ICD-10-CM

## 2024-07-08 DIAGNOSIS — E034 Atrophy of thyroid (acquired): Secondary | ICD-10-CM

## 2024-07-08 NOTE — Telephone Encounter (Signed)
 Pt called back today asking about the samples of Gemtesa .

## 2024-07-12 ENCOUNTER — Telehealth: Payer: Self-pay

## 2024-07-12 ENCOUNTER — Other Ambulatory Visit: Payer: Self-pay | Admitting: Physician Assistant

## 2024-07-12 DIAGNOSIS — E034 Atrophy of thyroid (acquired): Secondary | ICD-10-CM

## 2024-07-12 NOTE — Telephone Encounter (Signed)
 Unfortunately, we are no longer receiving enough samples from the Gemtesa  manufacture to keep patients on them long-term.  I am not able to provide her any more samples.  Please offer her an office visit with me to discuss third line therapies.

## 2024-07-12 NOTE — Telephone Encounter (Signed)
 Copied from CRM #[id number]. Topic: Clinical - Prescription Issue >> Jul 12, 2024  9:32 AM Frederich PARAS wrote: Reason for CRM: PT Called last week about about refll for metFormin  1000mg , , she went through [facility], she says dr Dineen  wants to up the medicine to 2000mg  , however when he went to the pharmacy it was the same 1000mg   pt wants to know if she was suppost to get the 2000mg  or the 1000mg 

## 2024-07-12 NOTE — Telephone Encounter (Signed)
 Please advise. I did not see any notes of increasing her Metformin .

## 2024-07-21 ENCOUNTER — Ambulatory Visit (INDEPENDENT_AMBULATORY_CARE_PROVIDER_SITE_OTHER): Admitting: Physician Assistant

## 2024-07-21 ENCOUNTER — Encounter: Payer: Self-pay | Admitting: Physician Assistant

## 2024-07-21 VITALS — BP 134/84 | HR 87 | Ht 65.0 in | Wt 220.0 lb

## 2024-07-21 DIAGNOSIS — M6281 Muscle weakness (generalized): Secondary | ICD-10-CM | POA: Diagnosis not present

## 2024-07-21 DIAGNOSIS — N3946 Mixed incontinence: Secondary | ICD-10-CM | POA: Diagnosis not present

## 2024-07-21 DIAGNOSIS — R300 Dysuria: Secondary | ICD-10-CM

## 2024-07-21 DIAGNOSIS — M47816 Spondylosis without myelopathy or radiculopathy, lumbar region: Secondary | ICD-10-CM | POA: Diagnosis not present

## 2024-07-21 LAB — URINALYSIS, COMPLETE
Bilirubin, UA: NEGATIVE
Glucose, UA: NEGATIVE
Ketones, UA: NEGATIVE
Nitrite, UA: POSITIVE — AB
Specific Gravity, UA: 1.015 (ref 1.005–1.030)
Urobilinogen, Ur: 1 mg/dL (ref 0.2–1.0)
pH, UA: 6 (ref 5.0–7.5)

## 2024-07-21 LAB — MICROSCOPIC EXAMINATION: WBC, UA: >30 /HPF — AB (ref 0–5)

## 2024-07-21 MED ORDER — GEMTESA 75 MG PO TABS: 75.0000 mg | ORAL_TABLET | ORAL | 11 refills | Status: DC

## 2024-07-21 MED ORDER — NITROFURANTOIN MONOHYD MACRO 100 MG PO CAPS: 100.0000 mg | ORAL_CAPSULE | Freq: Two times a day (BID) | ORAL | 0 refills | Status: AC

## 2024-07-21 NOTE — Progress Notes (Signed)
 07/21/2024 12:13 PM   Jenkins Cai January 25, 1941 [id number]  CC: Chief Complaint  Patient presents with   Other   HPI: Wendy Barnes is a 83 y.o. 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   Today she reports she has been unable to tolerate trospium  IR due to dry mouth.  We are unable to offer any more Gemtesa  samples, as we do not receive sufficient supply to allow for this.  Notably, she has a new cardiac pacemaker and is managed by Dr. Cindie.  She also reports 2 days of bladder discomfort and urinary frequency.  She has been taking Azo with some relief.  This represents her first UTI in more than a year.  In-office UA today positive for 1+ protein, 2+ blood, nitrates, and 2+ leukocytes; urine microscopy with >30 WBCs/HPF, 11-30 RBCs/HPF, and many bacteria.   PMH: Past Medical History:  Diagnosis Date   Anxiety    Cancer (HCC)    skin   Cardiac pacemaker in situ    Hyperchloremia    Hyperlipidemia    Hypertension    Hypothyroidism    Paroxysmal A-fib (HCC)    Sinus node dysfunction (HCC)    Squamous cell carcinoma of skin 09/18/2008   left temple   T2DM (type 2 diabetes mellitus) (HCC)    Thyroid  disease    Varicella    Vertigo     Surgical History: Past Surgical History:  Procedure Laterality Date   CATARACT EXTRACTION W/PHACO Left 11/17/2017   Procedure: CATARACT EXTRACTION PHACO AND INTRAOCULAR LENS PLACEMENT (IOC)-LEFT DIABETIC;  Surgeon: Jaye Fallow, MD;  Location: [facility] ORS;  Service: Ophthalmology;  Laterality: Left;  US  00:36 AP% 15.1 CDE 5.46 Fluid pack lot # [id number]   CATARACT EXTRACTION W/PHACO Right 12/22/2017   Procedure: CATARACT EXTRACTION PHACO AND INTRAOCULAR LENS PLACEMENT (IOC);  Surgeon: Jaye Fallow, MD;  Location: [facility] ORS;  Service: Ophthalmology;  Laterality: Right;  US  00:46.1 AP% 12.9 CDE  5.97 Fluid pack lot # [id number]   COLONOSCOPY N/A 06/19/2022   Procedure: COLONOSCOPY;  Surgeon: Onita Elspeth Sharper, DO;  Location: [facility] ENDOSCOPY;  Service: Gastroenterology;  Laterality: N/A;  DM   COLONOSCOPY WITH PROPOFOL  N/A 04/24/2016   Procedure: COLONOSCOPY WITH PROPOFOL ;  Surgeon: Lamar ONEIDA Holmes, MD;  Location: [facility];  Service: Endoscopy;  Laterality: N/A;   DILATION AND CURETTAGE OF UTERUS  2002   JOINT REPLACEMENT Right    PACEMAKER IMPLANT N/A 01/29/2024   Procedure: PACEMAKER IMPLANT;  Surgeon: Cindie Ole ONEIDA, MD;  Location: [facility] IN[facility];  Service: Cardiovascular;  Laterality: N/A;   REPLACEMENT TOTAL KNEE Right 02/20/2014   THYROIDECTOMY  2010   TONSILLECTOMY     TONSILLECTOMY AND ADENOIDECTOMY  1950   TOTAL KNEE ARTHROPLASTY  2015   right knee    Home Medications:  Allergies as of 07/21/2024       Reactions   Amoxicillin Hives, Itching, Rash   Has patient had a PCN reaction causing immediate rash, facial/tongue/throat swelling, SOB or lightheadedness with hypotension: Yes Has patient had a PCN reaction causing severe rash involving mucus membranes or skin necrosis: Yes Has patient had a PCN reaction that required hospitalization: No Has patient had a PCN reaction occurring within the last 10 years: Yes If all of the above answers are NO, then may proceed with Cephalosporin use.   Lovastatin  Other (See Comments)  myalgias   Penicillins Hives, Rash   Pravastatin  Other (See Comments)   Myalgias        Medication List        Accurate as of July 21, 2024 12:13 PM. If you have any questions, ask your nurse or doctor.          STOP taking these medications    trospium  20 MG tablet Commonly known as: SANCTURA  Stopped by: Lucie Hones       TAKE these medications    acetaminophen  500 MG tablet Commonly known as: TYLENOL  Take 500-1,000 mg by mouth every 6 (six) hours as needed (pain.).   amiodarone  200 MG  tablet Commonly known as: PACERONE  Take 1 tablet (200 mg) by mouth twice daily for 2 weeks, then take 1 tablet (200 mg) by mouth daily   amLODipine  10 MG tablet Commonly known as: NORVASC  Take 1 tablet (10 mg total) by mouth daily.   apixaban 5 MG Tabs tablet Commonly known as: ELIQUIS Take 5 mg by mouth every 12 (twelve) hours.   benazepril -hydrochlorthiazide 20-12.5 MG tablet Commonly known as: LOTENSIN  HCT TAKE 1 TABLET BY MOUTH TWICE DAILY   Biotin 2500 MCG Caps Take 2,500 mcg by mouth in the morning.   Coenzyme Q10 100 MG capsule Take 100 mg by mouth in the morning.   Cranberry Plus Vitamin C 4200-20-3 MG-MG-UNIT Caps Generic drug: Cranberry-Vitamin C-Vitamin E Take 1 capsule by mouth in the morning.   furosemide  20 MG tablet Commonly known as: LASIX  Take 1 tablet (20 mg total) by mouth as needed for edema.   gabapentin  100 MG capsule Commonly known as: NEURONTIN  TAKE 1-3 CAPSULES PRIOR TO BED FOR NEUROPATHY   Gemtesa  75 MG Tabs Generic drug: Vibegron  Take 1 tablet (75 mg total) by mouth every other day.   levocetirizine 5 MG tablet Commonly known as: XYZAL  Take 5 mg by mouth daily.   levothyroxine  88 MCG tablet Commonly known as: SYNTHROID  TAKE 1 TABLET(88 MCG) BY MOUTH DAILY BEFORE BREAKFAST   metFORMIN  1000 MG tablet Commonly known as: GLUCOPHAGE  TAKE 1 TABLET(1000 MG) BY MOUTH EVERY MORNING WITH BREAKFAST   metoprolol  succinate 25 MG 24 hr tablet Commonly known as: TOPROL -XL Take 1 tablet (25 mg total) by mouth in the morning and at bedtime.   nitrofurantoin  (macrocrystal-monohydrate) 100 MG capsule Commonly known as: MACROBID  Take 1 capsule (100 mg total) by mouth 2 (two) times daily for 5 days. Started by: Osric Klopf   rosuvastatin  5 MG tablet Commonly known as: CRESTOR  TAKE 1 TABLET(5 MG) BY MOUTH DAILY        Allergies:  Allergies  Allergen Reactions   Amoxicillin Hives, Itching and Rash    Has patient had a PCN reaction  causing immediate rash, facial/tongue/throat swelling, SOB or lightheadedness with hypotension: Yes Has patient had a PCN reaction causing severe rash involving mucus membranes or skin necrosis: Yes Has patient had a PCN reaction that required hospitalization: No Has patient had a PCN reaction occurring within the last 10 years: Yes If all of the above answers are NO, then may proceed with Cephalosporin use.    Lovastatin  Other (See Comments)    myalgias   Penicillins Hives and Rash   Pravastatin  Other (See Comments)    Myalgias    Family History: Family History  Problem Relation Age of Onset   Coronary artery disease Mother    Heart attack Mother    Coronary artery disease Father    Stroke Father    Breast  cancer Neg Hx     Social History:   reports that she quit smoking about 28 years ago. Her smoking use included cigarettes. She started smoking about 53 years ago. She has a 25 pack-year smoking history. She has never used smokeless tobacco. She reports current alcohol use. She reports that she does not use drugs.  Physical Exam: BP 134/84   Pulse 87   Ht 5' 5 (1.651 m)   Wt 220 lb (99.8 kg)   BMI 36.61 kg/m   Constitutional:  Alert and oriented, no acute distress, nontoxic appearing HEENT: [place], AT Cardiovascular: No clubbing, cyanosis, or edema Respiratory: Normal respiratory effort, no increased work of breathing Skin: No rashes, bruises or suspicious lesions Neurologic: Grossly intact, no focal deficits, moving all 4 extremities Psychiatric: Normal mood and affect  Laboratory Data: Results for orders placed or performed in visit on 07/21/24  Microscopic Examination   Collection Time: 07/21/24 11:10 AM   Urine  Result Value Ref Range   WBC, UA WILL FOLLOW    RBC, Urine WILL FOLLOW    Epithelial Cells (non renal) WILL FOLLOW    Renal Epithel, UA WILL FOLLOW    Casts WILL FOLLOW    Cast Type WILL FOLLOW    Crystals WILL FOLLOW    Crystal Type WILL FOLLOW     Mucus, UA WILL FOLLOW    Bacteria, UA WILL FOLLOW    Yeast, UA WILL FOLLOW    Trichomonas, UA WILL FOLLOW    Urinalysis Comments WILL FOLLOW   Urinalysis, Complete   Collection Time: 07/21/24 11:10 AM  Result Value Ref Range   Specific Gravity, UA 1.015 1.005 - 1.030   pH, UA 6.0 5.0 - 7.5   Color, UA Yellow Yellow   Appearance Ur Cloudy (A) Clear   Leukocytes,UA 2+ (A) Negative   Protein,UA 1+ (A) Negative/Trace   Glucose, UA Negative Negative   Ketones, UA Negative Negative   RBC, UA 2+ (A) Negative   Bilirubin, UA Negative Negative   Urobilinogen, Ur 1.0 0.2 - 1.0 mg/dL   Nitrite, UA Positive (A) Negative   Microscopic Examination See below:    Assessment & Plan:   1. Mixed incontinence (Primary) She has failed trospium  due to anticholinergic side effects.  Other antimuscarinics are contraindicated given her age.  Will submit prior Auth for Gemtesa .  We also discussed third line options including PTNS, Botox, and InterStim.  Of these, she is most interested in PTNS.  We will pursue this if her insurance will not cover the Gemtesa , though we will need to get cardiac clearance before proceeding with that. - Vibegron  (GEMTESA ) 75 MG TABS; Take 1 tablet (75 mg total) by mouth every other day.  Dispense: 30 tablet; Refill: 11  2. Dysuria UA appears grossly infected today, though nitrites are probably a false positive in the setting of Azo use.  Will start empiric Macrobid  and send for culture. - Urinalysis, Complete - CULTURE, URINE COMPREHENSIVE - nitrofurantoin , macrocrystal-monohydrate, (MACROBID ) 100 MG capsule; Take 1 capsule (100 mg total) by mouth 2 (two) times daily for 5 days.  Dispense: 10 capsule; Refill: 0  Return for Keep follow-up as scheduled.  Lucie Hones, PA-C  [facility] [place] [address] [place], [place] [address] [phone]

## 2024-07-26 DIAGNOSIS — M47816 Spondylosis without myelopathy or radiculopathy, lumbar region: Secondary | ICD-10-CM | POA: Diagnosis not present

## 2024-07-26 DIAGNOSIS — M6281 Muscle weakness (generalized): Secondary | ICD-10-CM | POA: Diagnosis not present

## 2024-07-28 ENCOUNTER — Ambulatory Visit: Payer: Self-pay | Admitting: Physician Assistant

## 2024-07-28 DIAGNOSIS — N390 Urinary tract infection, site not specified: Secondary | ICD-10-CM

## 2024-07-28 DIAGNOSIS — M6281 Muscle weakness (generalized): Secondary | ICD-10-CM | POA: Diagnosis not present

## 2024-07-28 DIAGNOSIS — M47816 Spondylosis without myelopathy or radiculopathy, lumbar region: Secondary | ICD-10-CM | POA: Diagnosis not present

## 2024-07-28 LAB — CULTURE, URINE COMPREHENSIVE

## 2024-08-01 ENCOUNTER — Telehealth: Payer: Self-pay | Admitting: Physician Assistant

## 2024-08-01 MED ORDER — CIPROFLOXACIN HCL 250 MG PO TABS: 250.0000 mg | ORAL_TABLET | Freq: Two times a day (BID) | ORAL | 0 refills | Status: AC

## 2024-08-01 NOTE — Telephone Encounter (Signed)
 Patient called stating she was seen last week for a UTI and was prescribed an antibiotic by Sam. She has completed the medication but reports persistent symptoms. She is requesting a follow-up--either a new antibiotic prescription or a refill of the previous one

## 2024-08-01 NOTE — Telephone Encounter (Signed)
 Patient called back in to clinic with still having UTI symptoms. Per her culture and Sam PA I was instructed to switch her antibiotic to Cipro  250mg  for 5 days. I place that Rx and patient will go pick that up.

## 2024-08-03 ENCOUNTER — Encounter: Payer: Self-pay | Admitting: Physician Assistant

## 2024-08-03 DIAGNOSIS — Z961 Presence of intraocular lens: Secondary | ICD-10-CM | POA: Diagnosis not present

## 2024-08-03 DIAGNOSIS — H43813 Vitreous degeneration, bilateral: Secondary | ICD-10-CM | POA: Diagnosis not present

## 2024-08-03 DIAGNOSIS — E119 Type 2 diabetes mellitus without complications: Secondary | ICD-10-CM | POA: Diagnosis not present

## 2024-08-03 LAB — HM DIABETES EYE EXAM

## 2024-08-04 ENCOUNTER — Ambulatory Visit
Admission: RE | Admit: 2024-08-04 | Discharge: 2024-08-04 | Disposition: A | Discharge status: Home / Self Care | Source: Ambulatory Visit | Attending: Physician Assistant | Admitting: Physician Assistant

## 2024-08-04 DIAGNOSIS — Z1231 Encounter for screening mammogram for malignant neoplasm of breast: Secondary | ICD-10-CM | POA: Diagnosis not present

## 2024-08-07 ENCOUNTER — Other Ambulatory Visit: Payer: Self-pay | Admitting: Physician Assistant

## 2024-08-07 DIAGNOSIS — E1159 Type 2 diabetes mellitus with other circulatory complications: Secondary | ICD-10-CM

## 2024-08-08 ENCOUNTER — Ambulatory Visit: Payer: Self-pay | Admitting: Physician Assistant

## 2024-08-08 NOTE — Telephone Encounter (Signed)
 Requested Prescriptions  Pending Prescriptions Disp Refills   benazepril -hydrochlorthiazide (LOTENSIN  HCT) 20-12.5 MG tablet [Pharmacy Med Name: BENAZEPRIL /HCTZ 20/12.5MG  TABLETS] 180 tablet 0    Sig: TAKE 1 TABLET BY MOUTH TWICE DAILY     Cardiovascular:  ACEI + Diuretic Combos Failed - 08/08/2024  4:28 PM      Failed - Cr in normal range and within 180 days    Creatinine  Date Value Ref Range Status  02/22/2014 0.58 (L) 0.60 - 1.30 mg/dL Final   Creatinine, Ser  Date Value Ref Range Status  05/13/2024 1.07 (H) 0.57 - 1.00 mg/dL Final         Passed - Na in normal range and within 180 days    Sodium  Date Value Ref Range Status  05/13/2024 140 134 - 144 mmol/L Final  02/22/2014 134 (L) 136 - 145 mmol/L Final         Passed - K in normal range and within 180 days    Potassium  Date Value Ref Range Status  05/13/2024 4.3 3.5 - 5.2 mmol/L Final  02/22/2014 3.9 3.5 - 5.1 mmol/L Final         Passed - eGFR is 30 or above and within 180 days    EGFR (African American)  Date Value Ref Range Status  02/22/2014 >60  Final   GFR calc Af Amer  Date Value Ref Range Status  01/02/2021 64 >59 mL/min/1.73 Final    Comment:    **In accordance with recommendations from the NKF-ASN Task force,**   [facility] is in the process of updating its eGFR calculation to the   2021 CKD-EPI creatinine equation that estimates kidney function   without a race variable.    EGFR (Non-African Amer.)  Date Value Ref Range Status  02/22/2014 >60  Final    Comment:    eGFR values <83mL/min/1.73 m2 may be an indication of chronic kidney disease (CKD). Calculated eGFR is useful in patients with stable renal function. The eGFR calculation will not be reliable in acutely ill patients when serum creatinine is changing rapidly. It is not useful in  patients on dialysis. The eGFR calculation may not be applicable to patients at the low and high extremes of body sizes, pregnant women, and vegetarians.     GFR calc non Af Amer  Date Value Ref Range Status  01/02/2021 55 (L) >59 mL/min/1.73 Final   eGFR  Date Value Ref Range Status  05/13/2024 53 (L) >59 mL/min/1.73 Final         Passed - Patient is not pregnant      Passed - Last BP in normal range    BP Readings from Last 1 Encounters:  07/21/24 134/84         Passed - Valid encounter within last 6 months    Recent Outpatient Visits           2 months ago Type 2 diabetes mellitus with diabetic polyneuropathy, without long-term current use of insulin (HCC)   [place] [facility] [place], [place], PA-C   5 months ago Paroxysmal atrial fibrillation [facility])   [place] [facility] [place], [place], PA-C       Future Appointments             In 1 week Ostwalt, Janna, PA-C [facility]   In 2 weeks Hester Alm BROCKS, MD [facility]   In 1 month Riddle, Suzann, NP [facility]     In 6 months Vaillancourt, Conrad, PA-C [facility] [place]

## 2024-08-09 DIAGNOSIS — I10 Essential (primary) hypertension: Secondary | ICD-10-CM | POA: Diagnosis not present

## 2024-08-09 DIAGNOSIS — I872 Venous insufficiency (chronic) (peripheral): Secondary | ICD-10-CM | POA: Diagnosis not present

## 2024-08-09 DIAGNOSIS — Z950 Presence of cardiac pacemaker: Secondary | ICD-10-CM | POA: Diagnosis not present

## 2024-08-09 DIAGNOSIS — I480 Paroxysmal atrial fibrillation: Secondary | ICD-10-CM | POA: Diagnosis not present

## 2024-08-09 DIAGNOSIS — E1100 Type 2 diabetes mellitus with hyperosmolarity without nonketotic hyperglycemic-hyperosmolar coma (NKHHC): Secondary | ICD-10-CM | POA: Diagnosis not present

## 2024-08-11 ENCOUNTER — Telehealth: Payer: Self-pay | Admitting: Physician Assistant

## 2024-08-11 NOTE — Telephone Encounter (Signed)
 [facility] faxed refill request for the following medications:  benazepril -hydrochlorthiazide (LOTENSIN  HCT) 20-12.5 MG tablet    Please advise.

## 2024-08-12 ENCOUNTER — Other Ambulatory Visit: Payer: Self-pay

## 2024-08-12 DIAGNOSIS — E1159 Type 2 diabetes mellitus with other circulatory complications: Secondary | ICD-10-CM

## 2024-08-12 MED ORDER — BENAZEPRIL-HYDROCHLOROTHIAZIDE 20-12.5 MG PO TABS: 1.0000 | ORAL_TABLET | Freq: Two times a day (BID) | ORAL | 0 refills | Status: DC

## 2024-08-12 NOTE — Telephone Encounter (Signed)
 Medication has been sent over to pharmacy.

## 2024-08-16 ENCOUNTER — Encounter: Payer: Self-pay | Admitting: Physician Assistant

## 2024-08-16 ENCOUNTER — Encounter: Payer: Medicare Other | Admitting: Family Medicine

## 2024-08-16 ENCOUNTER — Ambulatory Visit (INDEPENDENT_AMBULATORY_CARE_PROVIDER_SITE_OTHER): Payer: Self-pay | Admitting: Physician Assistant

## 2024-08-16 VITALS — BP 129/66 | HR 66 | Temp 97.7°F | Ht 65.0 in | Wt 222.4 lb

## 2024-08-16 DIAGNOSIS — E034 Atrophy of thyroid (acquired): Secondary | ICD-10-CM | POA: Diagnosis not present

## 2024-08-16 DIAGNOSIS — E1159 Type 2 diabetes mellitus with other circulatory complications: Secondary | ICD-10-CM

## 2024-08-16 DIAGNOSIS — M6281 Muscle weakness (generalized): Secondary | ICD-10-CM | POA: Diagnosis not present

## 2024-08-16 DIAGNOSIS — M47816 Spondylosis without myelopathy or radiculopathy, lumbar region: Secondary | ICD-10-CM | POA: Diagnosis not present

## 2024-08-16 DIAGNOSIS — N183 Chronic kidney disease, stage 3 unspecified: Secondary | ICD-10-CM

## 2024-08-16 DIAGNOSIS — I480 Paroxysmal atrial fibrillation: Secondary | ICD-10-CM

## 2024-08-16 DIAGNOSIS — N3281 Overactive bladder: Secondary | ICD-10-CM | POA: Insufficient documentation

## 2024-08-16 DIAGNOSIS — I152 Hypertension secondary to endocrine disorders: Secondary | ICD-10-CM

## 2024-08-16 DIAGNOSIS — E7800 Pure hypercholesterolemia, unspecified: Secondary | ICD-10-CM | POA: Diagnosis not present

## 2024-08-16 DIAGNOSIS — E1142 Type 2 diabetes mellitus with diabetic polyneuropathy: Secondary | ICD-10-CM

## 2024-08-16 DIAGNOSIS — Z0000 Encounter for general adult medical examination without abnormal findings: Secondary | ICD-10-CM

## 2024-08-16 DIAGNOSIS — Z0001 Encounter for general adult medical examination with abnormal findings: Secondary | ICD-10-CM

## 2024-08-16 DIAGNOSIS — G629 Polyneuropathy, unspecified: Secondary | ICD-10-CM

## 2024-08-16 NOTE — Progress Notes (Signed)
 Annual Wellness Visit     Patient: Wendy Barnes, Female    DOB: 11-07-1941, 83 y.o.   MRN: [id number] Visit Date: 08/16/2024  Today's Provider: Chastelyn Athens, PA-C   Chief Complaint  Patient presents with   Annual Exam    Patient presents for annual exam.  Diet normal, trying to add in more exercise currently doing physical therapy for sciatica. Sleeping okay, has some problems falling and staying asleep. Overall feeling well.    Subjective    Wendy Barnes is a 83 y.o. female who presents today for her Annual Wellness Visit.   Discussed the use of AI scribe software for clinical note transcription with the patient, who gave verbal consent to proceed.  History of Present Illness Wendy Barnes is an 83 year old female with diabetes, atrial fibrillation, and chronic pain who presents for a follow-up visit.  She experiences chronic pain in her lower back radiating down her leg. Physical therapy provides some relief, and she uses heat and stretching exercises for management.  Her diabetes is managed with metformin  2000 mg daily, divided into two doses.  For atrial fibrillation, she takes Eliquis and is exploring more affordable options through a [place] pharmacy with her daughter's help. She also takes metoprolol , hydrochlorothiazide , and lortansine.  She experiences leg swelling, managed by elevating her feet, wearing compression stockings, and taking Lasix  as needed.  She has sleep disturbances, waking at night to urinate, but generally feels well-rested upon waking.    Medications: Outpatient Medications Prior to Visit  Medication Sig   acetaminophen  (TYLENOL ) 500 MG tablet Take 500-1,000 mg by mouth every 6 (six) hours as needed (pain.).   amiodarone  (PACERONE ) 200 MG tablet Take 1 tablet (200 mg) by mouth twice daily for 2 weeks, then take 1 tablet (200 mg) by mouth daily   amLODipine  (NORVASC ) 10 MG tablet Take 1 tablet (10 mg total) by mouth daily.   apixaban  (ELIQUIS) 5 MG TABS tablet Take 5 mg by mouth every 12 (twelve) hours.   benazepril -hydrochlorthiazide (LOTENSIN  HCT) 20-12.5 MG tablet Take 1 tablet by mouth 2 (two) times daily.   Biotin 2500 MCG CAPS Take 2,500 mcg by mouth in the morning.   Coenzyme Q10 100 MG capsule Take 100 mg by mouth in the morning.   Cranberry-Vitamin C-Vitamin E (CRANBERRY PLUS VITAMIN C) 4200-20-3 MG-MG-UNIT CAPS Take 1 capsule by mouth in the morning.   furosemide  (LASIX ) 20 MG tablet Take 1 tablet (20 mg total) by mouth as needed for edema.   gabapentin  (NEURONTIN ) 100 MG capsule TAKE 1-3 CAPSULES PRIOR TO BED FOR NEUROPATHY   levocetirizine (XYZAL ) 5 MG tablet Take 5 mg by mouth daily.   levothyroxine  (SYNTHROID ) 88 MCG tablet TAKE 1 TABLET(88 MCG) BY MOUTH DAILY BEFORE BREAKFAST   metFORMIN  (GLUCOPHAGE ) 1000 MG tablet TAKE 1 TABLET(1000 MG) BY MOUTH EVERY MORNING WITH BREAKFAST (Patient taking differently: Take 2,000 mg by mouth daily with breakfast.)   metoprolol  succinate (TOPROL -XL) 25 MG 24 hr tablet Take 1 tablet (25 mg total) by mouth in the morning and at bedtime.   rosuvastatin  (CRESTOR ) 5 MG tablet TAKE 1 TABLET(5 MG) BY MOUTH DAILY   Vibegron  (GEMTESA ) 75 MG TABS Take 1 tablet (75 mg total) by mouth every other day.   No facility-administered medications prior to visit.    Allergies  Allergen Reactions   Amoxicillin Hives, Itching and Rash    Has patient had a PCN reaction causing immediate rash, facial/tongue/throat swelling, SOB or lightheadedness with  hypotension: Yes Has patient had a PCN reaction causing severe rash involving mucus membranes or skin necrosis: Yes Has patient had a PCN reaction that required hospitalization: No Has patient had a PCN reaction occurring within the last 10 years: Yes If all of the above answers are NO, then may proceed with Cephalosporin use.    Lovastatin  Other (See Comments)    myalgias   Penicillins Hives and Rash   Pravastatin  Other (See Comments)     Myalgias    Patient Care Team: Jelissa Espiritu, PA-C as PCP - General (Physician Assistant) Cindie Ole DASEN, MD as PCP - Electrophysiology (Cardiology) Wilburn, Keller BROCKS, MD as PCP - Cardiology (Cardiology) Jaye Fallow, MD as Referring Physician (Ophthalmology) Hester Alm BROCKS, MD as Consulting Physician (Dermatology)  Review of Systems  All other systems reviewed and are negative.        Objective    Vitals: BP 129/66 (BP Location: Right Arm, Patient Position: Sitting, Cuff Size: Normal)   Pulse 66   Temp 97.7 F (36.5 C) (Oral)   Ht 5' 5 (1.651 m)   Wt 222 lb 6.4 oz (100.9 kg)   SpO2 100%   BMI 37.01 kg/m      Physical Exam Vitals reviewed.  Constitutional:      General: She is not in acute distress.    Appearance: Normal appearance. She is well-developed. She is obese. She is not ill-appearing, toxic-appearing or diaphoretic.  HENT:     Head: Normocephalic and atraumatic.     Right Ear: Tympanic membrane, ear canal and external ear normal.     Left Ear: Tympanic membrane, ear canal and external ear normal.     Nose: Nose normal. No congestion or rhinorrhea.     Mouth/Throat:     Mouth: Mucous membranes are moist.     Pharynx: Oropharynx is clear. No oropharyngeal exudate.  Eyes:     General: No scleral icterus.       Right eye: No discharge.        Left eye: No discharge.     Conjunctiva/sclera: Conjunctivae normal.     Pupils: Pupils are equal, round, and reactive to light.  Neck:     Thyroid : No thyromegaly.     Vascular: No carotid bruit.  Cardiovascular:     Rate and Rhythm: Normal rate and regular rhythm.     Pulses: Normal pulses.     Heart sounds: Normal heart sounds. No murmur heard.    No friction rub. No gallop.  Pulmonary:     Effort: Pulmonary effort is normal. No respiratory distress.     Breath sounds: Normal breath sounds. No wheezing or rales.  Abdominal:     General: Abdomen is flat. Bowel sounds are normal. There is no  distension.     Palpations: Abdomen is soft. There is no mass.     Tenderness: There is no abdominal tenderness. There is no right CVA tenderness, left CVA tenderness, guarding or rebound.     Hernia: No hernia is present.  Musculoskeletal:        General: No swelling, tenderness, deformity or signs of injury. Normal range of motion.     Cervical back: Normal range of motion and neck supple. No rigidity or tenderness.     Right lower leg: No edema.     Left lower leg: No edema.  Lymphadenopathy:     Cervical: No cervical adenopathy.  Skin:    General: Skin is warm and dry.  Coloration: Skin is not jaundiced or pale.     Findings: No bruising, erythema, lesion or rash.  Neurological:     Mental Status: She is alert and oriented to person, place, and time. Mental status is at baseline.     Gait: Gait normal.  Psychiatric:        Mood and Affect: Mood normal.        Behavior: Behavior normal.        Thought Content: Thought content normal.        Judgment: Judgment normal.     Most recent functional status assessment:    01/29/2024    8:00 PM  In your present state of health, do you have any difficulty performing the following activities:  Hearing? 0  Vision? 0  Difficulty concentrating or making decisions? 0   Most recent fall risk assessment:    08/16/2024    8:40 AM  Fall Risk   Falls in the past year? 1  Number falls in past yr: 0  Injury with Fall? 0  Risk for fall due to : History of fall(s)  Follow up Falls evaluation completed    Most recent depression screenings:    08/16/2024    8:40 AM 05/19/2024    9:12 AM  PHQ 2/9 Scores  PHQ - 2 Score 0 0  PHQ- 9 Score 0 0   Most recent cognitive screening:    08/05/2022    9:10 AM  6CIT Screen  What Year? 0 points  What month? 0 points  What time? 0 points  Count back from 20 0 points  Months in reverse 0 points  Repeat phrase 2 points  Total Score 2 points   Most recent Audit-C alcohol use screening     03/04/2023   10:07 AM  Alcohol Use Disorder Test (AUDIT)  1. How often do you have a drink containing alcohol? 0   A score of 3 or more in women, and 4 or more in men indicates increased risk for alcohol abuse, EXCEPT if all of the points are from question 1   No results found for any visits on 08/16/24.  Assessment & Plan     Annual wellness visit done today including the all of the following: Reviewed patient's Family Medical History Reviewed and updated list of patient's medical providers Assessment of cognitive impairment was done Assessed patient's functional ability Established a written schedule for health screening services Health Risk Assessent Completed and Reviewed  Exercise Activities and Dietary recommendations  Goals      DIET - EAT MORE FRUITS AND VEGETABLES     DIET - REDUCE FAT INTAKE     Recommend cutting out all fried foods to help aid in weight loss.      Reduce portion size     Recommend to work on portion control for daily meals.         Immunization History  Administered Date(s) Administered   Fluad Quad(high Dose 65+) 08/23/2019, 08/29/2020   Fluad Trivalent(High Dose 65+) 08/13/2023   INFLUENZA, HIGH DOSE SEASONAL PF 10/16/2015, 08/20/2016, 09/28/2017, 10/16/2018   Influenza Inj Mdck Quad Pf 09/30/2022   Influenza-Unspecified 09/13/2021   PFIZER(Purple Top)SARS-COV-2 Vaccination 01/16/2020, 02/06/2020, 09/17/2020, 07/25/2022   Pneumococcal Conjugate-13 10/30/2014   Pneumococcal Polysaccharide-23 01/02/2010   Zoster, Live 02/08/2009    Health Maintenance  Topic Date Due   DTaP/Tdap/Td (1 - Tdap) Never done   Zoster Vaccines- Shingrix (1 of 2) 09/03/1962   COVID-19 Vaccine (5 - 2025-26 season)  09/01/2024 (Originally 08/15/2024)   INFLUENZA VACCINE  09/14/2024 (Originally 07/15/2024)   HEMOGLOBIN A1C  11/19/2024   Diabetic kidney evaluation - Urine ACR  02/21/2025   FOOT EXAM  03/11/2025   Diabetic kidney evaluation - eGFR measurement   05/13/2025   OPHTHALMOLOGY EXAM  08/03/2025   Medicare Annual Wellness (AWV)  08/16/2025   Pneumococcal Vaccine: 50+ Years  Completed   DEXA SCAN  Completed   HPV VACCINES  Aged Out   Meningococcal B Vaccine  Aged Out   Colonoscopy  Discontinued   Hepatitis C Screening  Discontinued     Discussed health benefits of physical activity, and encouraged her to engage in regular exercise appropriate for her age and condition.   Paroxysmal atrial fibrillation (HCC) Chronic and stable Continue Eliquis through [place] pharmacy Will follow-up   Hypertension associated with diabetes (HCC) Chronic and stable Manage on benazepril  20 twice daily and hydrochlorothiazide  12.5 Mg twice daily Continue current blood pressure regimen, lifestyle modifications Will follow-up  Hypercholesteremia Chronic and stable Continue Crestor  5 Will repeat lab work as a follow-up in 3 months Continue lifestyle modifications  Type 2 diabetes mellitus with diabetic polyneuropathy, without long-term current use of insulin (HCC) Chronic stable Continue metformin  2000 Mg daily Continue low carb diet and daily exercise as tolerated Will follow-up with  Hypothyroidism due to acquired atrophy of thyroid  Chronic and stable Continue levothyroxine  88 mcg daily Will recheck labs as a follow-up  OAB (overactive bladder) Chronic and stable Continue Gemtesa  75 Mg Will follow-up with urology  Revisit vaccination at the follow-up.  The patient was advised to call back or seek an in-person evaluation if the symptoms worsen or if the condition fails to improve as anticipated.   I discussed the assessment and treatment plan with the patient. The patient was provided an opportunity to ask questions and all were answered. The patient agreed with the plan and demonstrated an understanding of the instructions.   I, Ramsha Lonigro, PA-C have reviewed all documentation for this visit. The documentation on 08/16/2024  for the  exam, diagnosis, procedures, and orders are all accurate and complete.     Jolynn Spencer, PA-C  [facility] [phone] (phone) [phone] (fax)  [facility]

## 2024-08-18 DIAGNOSIS — M47816 Spondylosis without myelopathy or radiculopathy, lumbar region: Secondary | ICD-10-CM | POA: Diagnosis not present

## 2024-08-18 DIAGNOSIS — M6281 Muscle weakness (generalized): Secondary | ICD-10-CM | POA: Diagnosis not present

## 2024-08-23 ENCOUNTER — Encounter: Payer: Self-pay | Admitting: Dermatology

## 2024-08-23 ENCOUNTER — Ambulatory Visit (INDEPENDENT_AMBULATORY_CARE_PROVIDER_SITE_OTHER): Payer: Medicare Other | Admitting: Dermatology

## 2024-08-23 DIAGNOSIS — L821 Other seborrheic keratosis: Secondary | ICD-10-CM

## 2024-08-23 DIAGNOSIS — W908XXA Exposure to other nonionizing radiation, initial encounter: Secondary | ICD-10-CM

## 2024-08-23 DIAGNOSIS — Z85828 Personal history of other malignant neoplasm of skin: Secondary | ICD-10-CM

## 2024-08-23 DIAGNOSIS — D229 Melanocytic nevi, unspecified: Secondary | ICD-10-CM

## 2024-08-23 DIAGNOSIS — L820 Inflamed seborrheic keratosis: Secondary | ICD-10-CM | POA: Diagnosis not present

## 2024-08-23 DIAGNOSIS — D1801 Hemangioma of skin and subcutaneous tissue: Secondary | ICD-10-CM | POA: Diagnosis not present

## 2024-08-23 DIAGNOSIS — L578 Other skin changes due to chronic exposure to nonionizing radiation: Secondary | ICD-10-CM | POA: Diagnosis not present

## 2024-08-23 DIAGNOSIS — L814 Other melanin hyperpigmentation: Secondary | ICD-10-CM | POA: Diagnosis not present

## 2024-08-23 DIAGNOSIS — Z8589 Personal history of malignant neoplasm of other organs and systems: Secondary | ICD-10-CM

## 2024-08-23 DIAGNOSIS — Z1283 Encounter for screening for malignant neoplasm of skin: Secondary | ICD-10-CM

## 2024-08-23 NOTE — Progress Notes (Signed)
   Follow-Up Visit   Subjective  Carlea Badour is a 83 y.o. female who presents for the following: Skin Cancer Screening and Full Body Skin Exam, hx of SCC  The patient presents for Total-Body Skin Exam (TBSE) for skin cancer screening and mole check. The patient has spots, moles and lesions to be evaluated, some may be new or changing and the patient may have concern these could be cancer.  The following portions of the chart were reviewed this encounter and updated as appropriate: medications, allergies, medical history  Review of Systems:  No other skin or systemic complaints except as noted in HPI or Assessment and Plan.  Objective  Well appearing patient in no apparent distress; mood and affect are within normal limits.  A full examination was performed including scalp, head, eyes, ears, nose, lips, neck, chest, axillae, abdomen, back, buttocks, bilateral upper extremities, bilateral lower extremities, hands, feet, fingers, toes, fingernails, and toenails. All findings within normal limits unless otherwise noted below.   Relevant physical exam findings are noted in the Assessment and Plan.  right forearm x 2, right chest x 1 Stuck-on, waxy, tan-brown papules and plaques -- Discussed benign etiology and prognosis.   Assessment & Plan   SKIN CANCER SCREENING PERFORMED TODAY.  ACTINIC DAMAGE - Chronic condition, secondary to cumulative UV/sun exposure - diffuse scaly erythematous macules with underlying dyspigmentation - Recommend daily broad spectrum sunscreen SPF 30+ to sun-exposed areas, reapply every 2 hours as needed.  - Staying in the shade or wearing long sleeves, sun glasses (UVA+UVB protection) and wide brim hats (4-inch brim around the entire circumference of the hat) are also recommended for sun protection.  - Call for new or changing lesions.  LENTIGINES, SEBORRHEIC KERATOSES, HEMANGIOMAS - Benign normal skin lesions - Benign-appearing - Call for any  changes  MELANOCYTIC NEVI - Tan-brown and/or pink-flesh-colored symmetric macules and papules - Benign appearing on exam today - Observation - Call clinic for new or changing moles - Recommend daily use of broad spectrum spf 30+ sunscreen to sun-exposed areas.   HISTORY OF SQUAMOUS CELL CARCINOMA OF THE SKIN - No evidence of recurrence today - No lymphadenopathy - Recommend regular full body skin exams - Recommend daily broad spectrum sunscreen SPF 30+ to sun-exposed areas, reapply every 2 hours as needed.  - Call if any new or changing lesions are noted between office visits  INFLAMED SEBORRHEIC KERATOSIS right forearm x 2, right chest x 1 Symptomatic, irritating, patient would like treated.  Destruction of lesion - right forearm x 2, right chest x 1 Complexity: simple   Destruction method: cryotherapy   Informed consent: discussed and consent obtained   Timeout:  patient name, date of birth, surgical site, and procedure verified Lesion destroyed using liquid nitrogen: Yes   Region frozen until ice ball extended beyond lesion: Yes   Outcome: patient tolerated procedure well with no complications   Post-procedure details: wound care instructions given     Return in about 1 year (around 08/23/2025) for TBSE, hx of SCC.  IFay Kirks, CMA, am acting as scribe for Alm Rhyme, MD .   Documentation: I have reviewed the above documentation for accuracy and completeness, and I agree with the above.  Alm Rhyme, MD

## 2024-08-23 NOTE — Patient Instructions (Addendum)

## 2024-08-25 DIAGNOSIS — M47816 Spondylosis without myelopathy or radiculopathy, lumbar region: Secondary | ICD-10-CM | POA: Diagnosis not present

## 2024-08-25 DIAGNOSIS — M6281 Muscle weakness (generalized): Secondary | ICD-10-CM | POA: Diagnosis not present

## 2024-08-31 DIAGNOSIS — M47816 Spondylosis without myelopathy or radiculopathy, lumbar region: Secondary | ICD-10-CM | POA: Diagnosis not present

## 2024-08-31 DIAGNOSIS — M6281 Muscle weakness (generalized): Secondary | ICD-10-CM | POA: Diagnosis not present

## 2024-09-02 DIAGNOSIS — M6281 Muscle weakness (generalized): Secondary | ICD-10-CM | POA: Diagnosis not present

## 2024-09-02 DIAGNOSIS — M47816 Spondylosis without myelopathy or radiculopathy, lumbar region: Secondary | ICD-10-CM | POA: Diagnosis not present

## 2024-09-06 DIAGNOSIS — M47816 Spondylosis without myelopathy or radiculopathy, lumbar region: Secondary | ICD-10-CM | POA: Diagnosis not present

## 2024-09-06 DIAGNOSIS — M6281 Muscle weakness (generalized): Secondary | ICD-10-CM | POA: Diagnosis not present

## 2024-09-08 DIAGNOSIS — M47816 Spondylosis without myelopathy or radiculopathy, lumbar region: Secondary | ICD-10-CM | POA: Diagnosis not present

## 2024-09-08 DIAGNOSIS — M6281 Muscle weakness (generalized): Secondary | ICD-10-CM | POA: Diagnosis not present

## 2024-09-09 ENCOUNTER — Other Ambulatory Visit: Payer: Self-pay | Admitting: Physician Assistant

## 2024-09-09 DIAGNOSIS — E1142 Type 2 diabetes mellitus with diabetic polyneuropathy: Secondary | ICD-10-CM

## 2024-09-09 NOTE — Telephone Encounter (Signed)
 Copied from CRM [id number]. Topic: Clinical - Medication Refill >> Sep 09, 2024  8:41 AM [place] B wrote: Medication: metFORMIN  (GLUCOPHAGE ) 2000 MG table Patient states dr Dineen raised the mg to 2000 at her last appt  Has the patient contacted their pharmacy? no   This is the patient's preferred pharmacy:  [id number] [place], [place] - [address] AT [facility] OF [address] [place] [address] Phone: [phone] Fax: [phone]  Is this the correct pharmacy for this prescription? yes Has the prescription been filled recently? no  Is the patient out of the medication? no  Has the patient been seen for an appointment in the last year OR does the patient have an upcoming appointment? yes  Can we respond through MyChart? yes  Agent: Please be advised that Rx refills may take up to 3 business days. We ask that you follow-up with your pharmacy.

## 2024-09-09 NOTE — Telephone Encounter (Signed)
 Requested medications are due for refill today.  yes  Requested medications are on the active medications list.  yes  Last refill. 07/08/2024 #[id number]  Future visit scheduled.   yes  Notes to clinic.  Per note from OV of 9/2 - Type 2 diabetes mellitus with diabetic polyneuropathy, without long-term current use of insulin (HCC) Chronic stable Continue metformin  2000 Mg daily   Sig from request does not match recent ov instructions. Please review for refill.  Requested Prescriptions  Pending Prescriptions Disp Refills   metFORMIN  (GLUCOPHAGE ) 1000 MG tablet 90 tablet 0     Endocrinology:  Diabetes - Biguanides Failed - 09/09/2024  4:36 PM      Failed - Cr in normal range and within 360 days    Creatinine  Date Value Ref Range Status  02/22/2014 0.58 (L) 0.60 - 1.30 mg/dL Final   Creatinine, Ser  Date Value Ref Range Status  05/13/2024 1.07 (H) 0.57 - 1.00 mg/dL Final         Failed - eGFR in normal range and within 360 days    EGFR (African American)  Date Value Ref Range Status  02/22/2014 >60  Final   GFR calc Af Amer  Date Value Ref Range Status  01/02/2021 64 >59 mL/min/1.73 Final    Comment:    **In accordance with recommendations from the NKF-ASN Task force,**   [facility] is in the process of updating its eGFR calculation to the   2021 CKD-EPI creatinine equation that estimates kidney function   without a race variable.    EGFR (Non-African Amer.)  Date Value Ref Range Status  02/22/2014 >60  Final    Comment:    eGFR values <55mL/min/1.73 m2 may be an indication of chronic kidney disease (CKD). Calculated eGFR is useful in patients with stable renal function. The eGFR calculation will not be reliable in acutely ill patients when serum creatinine is changing rapidly. It is not useful in  patients on dialysis. The eGFR calculation may not be applicable to patients at the low and high extremes of body sizes, pregnant women, and vegetarians.    GFR calc non Af  Amer  Date Value Ref Range Status  01/02/2021 55 (L) >59 mL/min/1.73 Final   eGFR  Date Value Ref Range Status  05/13/2024 53 (L) >59 mL/min/1.73 Final         Failed - B12 Level in normal range and within 720 days    No results found for: VITAMINB12       Passed - HBA1C is between 0 and 7.9 and within 180 days    Hgb A1c MFr Bld  Date Value Ref Range Status  05/20/2024 6.4 (H) 4.8 - 5.6 % Final    Comment:             Prediabetes: 5.7 - 6.4          Diabetes: >6.4          Glycemic control for adults with diabetes: <7.0          Passed - Valid encounter within last 6 months    Recent Outpatient Visits           3 weeks ago Encounter for subsequent annual wellness visit (AWV) in Medicare patient   [place] [facility] [place], [place], PA-C   3 months ago Type 2 diabetes mellitus with diabetic polyneuropathy, without long-term current use of insulin [facility])   [place] [facility] [place], Janna, PA-C  6 months ago Paroxysmal atrial fibrillation (HCC)   [place] [facility] [place], [place], PA-C       Future Appointments             In 4 days Riddle, Suzann, NP [facility] [place]   In 5 months Maurine Lukes, PA-C [facility]   In 11 months Hester Alm BROCKS, MD [facility]            Passed - CBC within normal limits and completed in the last 12 months    WBC  Date Value Ref Range Status  02/22/2024 7.5 3.4 - 10.8 x10E3/uL Final  02/08/2014 8.7 3.6 - 11.0 x10 3/mm 3 Final   RBC  Date Value Ref Range Status  02/22/2024 4.54 3.77 - 5.28 x10E6/uL Final  02/08/2014 4.33 3.80 - 5.20 X10 6/mm 3 Final   Hemoglobin  Date Value Ref Range Status  02/22/2024 13.3 11.1 - 15.9 g/dL Final   Hematocrit  Date Value Ref Range Status  02/22/2024 40.8 34.0 - 46.6 % Final   MCHC  Date Value Ref Range Status  02/22/2024 32.6 31.5 - 35.7 g/dL Final   97/74/7984 65.4 32.0 - 36.0 g/dL Final   [facility]  Date Value Ref Range Status  02/22/2024 29.3 26.6 - 33.0 pg Final  02/08/2014 30.2 26.0 - 34.0 pg Final   MCV  Date Value Ref Range Status  02/22/2024 90 79 - 97 fL Final  02/08/2014 88 80 - 100 fL Final   No results found for: PLTCOUNTKUC, LABPLAT, POCPLA RDW  Date Value Ref Range Status  02/22/2024 13.6 11.7 - 15.4 % Final  02/08/2014 14.1 11.5 - 14.5 % Final

## 2024-09-12 ENCOUNTER — Ambulatory Visit (INDEPENDENT_AMBULATORY_CARE_PROVIDER_SITE_OTHER): Payer: Medicare Other

## 2024-09-12 DIAGNOSIS — I495 Sick sinus syndrome: Secondary | ICD-10-CM

## 2024-09-12 LAB — CUP PACEART REMOTE DEVICE CHECK
Battery Remaining Longevity: 154 mo
Battery Voltage: 3.14 V
Brady Statistic AP VP Percent: 0.2 %
Brady Statistic AP VS Percent: 98.38 %
Brady Statistic AS VP Percent: 0.05 %
Brady Statistic AS VS Percent: 1.37 %
Brady Statistic RA Percent Paced: 94.4 %
Brady Statistic RV Percent Paced: 2.64 %
Date Time Interrogation Session: 20250929010246
Implantable Lead Connection Status: 753985
Implantable Lead Connection Status: 753985
Implantable Lead Implant Date: 20250214
Implantable Lead Implant Date: 20250214
Implantable Lead Location: 753859
Implantable Lead Location: 753860
Implantable Lead Model: 3830
Implantable Lead Model: 5076
Implantable Pulse Generator Implant Date: 20250214
Lead Channel Impedance Value: 342 Ohm
Lead Channel Impedance Value: 418 Ohm
Lead Channel Impedance Value: 418 Ohm
Lead Channel Impedance Value: 532 Ohm
Lead Channel Pacing Threshold Amplitude: 0.75 V
Lead Channel Pacing Threshold Amplitude: 0.875 V
Lead Channel Pacing Threshold Pulse Width: 0.4 ms
Lead Channel Pacing Threshold Pulse Width: 0.4 ms
Lead Channel Sensing Intrinsic Amplitude: 14.125 mV
Lead Channel Sensing Intrinsic Amplitude: 14.125 mV
Lead Channel Sensing Intrinsic Amplitude: 2.875 mV
Lead Channel Sensing Intrinsic Amplitude: 2.875 mV
Lead Channel Setting Pacing Amplitude: 1.5 V
Lead Channel Setting Pacing Amplitude: 1.5 V
Lead Channel Setting Pacing Pulse Width: 0.4 ms
Lead Channel Setting Sensing Sensitivity: 1.2 mV
Zone Setting Status: 755011
Zone Setting Status: 755011

## 2024-09-12 MED ORDER — METFORMIN HCL 1000 MG PO TABS: 2000.0000 mg | ORAL_TABLET | Freq: Every day | ORAL | 3 refills | Status: AC

## 2024-09-13 ENCOUNTER — Ambulatory Visit: Admitting: Cardiology

## 2024-09-13 NOTE — Progress Notes (Deleted)
 Electrophysiology Clinic Note    Date:  09/13/2024  Patient ID:  Wendy Barnes, DOB 15-Jan-1941, MRN [id number] PCP:  Dineen Channel, PA-C  Cardiologist:  Keller JAYSON Paterson, MD  Electrophysiologist:  OLE ONEIDA HOLTS, MD  Electrophysiology APP:  Sedalia Greeson, NP    ***refresh  Discussed the use of AI scribe software for clinical note transcription with the patient, who gave verbal consent to proceed.   Patient Profile    Chief Complaint: AF follow-up  History of Present Illness: Wendy Barnes is a 83 y.o. female with PMH notable for parox AFib, SND s/p PPM, HTN, T2DM ; seen today for OLE ONEIDA HOLTS, MD for routine electrophysiology followup.   She is historically asymptomatic of her afib, though was having dizziness. 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   I saw her for routine 3mon post-PPM implant 04/2024 where she was having increased SOB and swelling, AF 30% on PPM. We initiated amiodarone  to see if improved symptoms. I saw her in follow-up 05/2024 where she was feeling better, still elevated AF burden on PPM.   On follow-up today, *** AF burden, symptoms *** palpitations *** bleeding concerns   - no labs unless concerned    Since last being seen in our clinic the patient reports doing ***.  she denies chest pain, palpitations, dyspnea, PND, orthopnea, nausea, vomiting, dizziness, syncope, edema, weight gain, or early satiety.      Arrhythmia/Device History MDT dual chamber PPM, imp 01/2024; dx SND   AAD -  Amiodarone     ROS:  Please see the history of present illness. All other systems are reviewed and otherwise negative.    Physical Exam    VS:  There were no vitals taken for this visit. BMI: There is no height or weight on file to calculate BMI.           Wt Readings from Last 3 Encounters:  08/16/24 222 lb 6.4 oz (100.9 kg)  07/21/24 220 lb (99.8 kg)  06/13/24 223 lb 12.8 oz (101.5  kg)     GEN- The patient is well appearing, alert and oriented x 3 today.   Lungs- Clear to ausculation bilaterally, normal work of breathing.  Heart- {Blank single:19197::Regular,Irregularly irregular} rate and rhythm, no murmurs, rubs or gallops Extremities- {EDEMA LEVEL:28147::No} peripheral edema, warm, dry Skin-  *** device pocket well-healed, no tethering   *** Brief check performed without iterative lead testing/measurements      Studies Reviewed   Previous EP, cardiology notes.    EKG is ordered. Personal review of EKG from today shows:  ***        TTE, 01/11/2024 NORMAL LEFT VENTRICULAR SYSTOLIC FUNCTION WITH NO LVH  ESTIMATED EF: >55%  NORMAL LA PRESSURES WITH DIASTOLIC DYSFUNCTION (GRADE 1)  NORMAL RIGHT VENTRICULAR SYSTOLIC FUNCTION  VALVULAR REGURGITATION: TRIVIAL AR, MILD MR, TRIVIAL PR, MODERATE TR  NO VALVULAR STENOSIS    Assessment and Plan     #) parox Afib #) amiodarone  monitoring    #) Hypercoag d/t *** afib CHA2DS2-VASc Score = at least 5 [CHF History: 0, HTN History: 1, Diabetes History: 1, Stroke History: 0, Vascular Disease History: 0, Age Score: 2, Gender Score: 1].  Therefore, the patient's annual risk of stroke is 7.2 %.     {Confirm score is correct.  If not, click here to update score.  REFRESH note.  :1}   Stroke ppx - ***, appropriately dosed No bleeding concerns   #)  SND, post-conversion pauses #) PPM in situ   #) ***   {Are you ordering a CV Procedure (e.g. stress test, cath, DCCV, TEE, etc)?   Press F2        :[id number]}   Current medicines are reviewed at length with the patient today.   The patient {ACTIONS; HAS/DOES NOT HAVE:19233} concerns regarding her medicines.  The following changes were made today:  {NONE DEFAULTED:18576}  Labs/ tests ordered today include: *** No orders of the defined types were placed in this encounter.    Disposition: Follow up with {EPMDS:28135::EP Team} or EP APP {EPFOLLOW  UP:28173}   Signed, Chantal Needle, NP  09/13/24  9:46 AM  Electrophysiology [facility]

## 2024-09-14 DIAGNOSIS — M6281 Muscle weakness (generalized): Secondary | ICD-10-CM | POA: Diagnosis not present

## 2024-09-14 DIAGNOSIS — M47816 Spondylosis without myelopathy or radiculopathy, lumbar region: Secondary | ICD-10-CM | POA: Diagnosis not present

## 2024-09-14 NOTE — Progress Notes (Signed)
 Remote PPM Transmission

## 2024-09-16 ENCOUNTER — Ambulatory Visit: Payer: Self-pay | Admitting: Cardiology

## 2024-09-16 DIAGNOSIS — M6281 Muscle weakness (generalized): Secondary | ICD-10-CM | POA: Diagnosis not present

## 2024-09-16 DIAGNOSIS — M47816 Spondylosis without myelopathy or radiculopathy, lumbar region: Secondary | ICD-10-CM | POA: Diagnosis not present

## 2024-10-05 DIAGNOSIS — M6281 Muscle weakness (generalized): Secondary | ICD-10-CM | POA: Diagnosis not present

## 2024-10-05 DIAGNOSIS — M47816 Spondylosis without myelopathy or radiculopathy, lumbar region: Secondary | ICD-10-CM | POA: Diagnosis not present

## 2024-10-05 NOTE — Progress Notes (Unsigned)
 Electrophysiology Clinic Note    Date:  10/06/2024  Patient ID:  Wendy Barnes, DOB 03-11-1941, MRN [id number] PCP:  Dineen Channel, PA-C  Cardiologist:  Keller JAYSON Paterson, MD  Electrophysiologist:  OLE ONEIDA HOLTS, MD  Electrophysiology APP:  Armistead Sult, NP     Discussed the use of AI scribe software for clinical note transcription with the patient, who gave verbal consent to proceed.   Patient Profile    Chief Complaint: AF follow-up  History of Present Illness: Wendy Barnes is a 83 y.o. female with PMH notable for parox AFib, SND s/p PPM, HTN, T2DM ; seen today for OLE ONEIDA HOLTS, MD for routine electrophysiology followup.   She is historically asymptomatic of her afib, though was having dizziness. She wore an ambulatory monitor that showed significant post-conversion pauses up to 10 seconds, so PPM was recommended and implanted 01/2024  I saw her for routine 3mon post-PPM implant 04/2024 where she was having increased SOB and swelling, AF 30% on PPM. We initiated amiodarone  to see if improved symptoms. I saw her in follow-up 05/2024 where she was feeling better, still elevated AF burden on PPM.   On follow-up today, she is doing well without complaints. She denies chest pain, chest pressure, palpitations. She recently visited family and attended an [employer] that required a significant amount of walking. She was not SOB with the activity, but was fatigued by the end of the day. She believes this is an improvement over her exercise capacity in the past.  She continues to take amiodarone  200mg  daily without off-target effects Continues to take eliquis BID, no bleeding concerns. She is researching options to obtain eliquis from [place] d/t cost savings.     Arrhythmia/Device History MDT dual chamber PPM, imp 01/2024; dx SND   AAD -  Amiodarone     ROS:  Please see the history of present illness. All other systems are reviewed and otherwise negative.     Physical Exam    VS:  BP 136/70   Pulse 71   Ht 5' 5.5 (1.664 m)   Wt 224 lb 3.2 oz (101.7 kg)   SpO2 98%   BMI 36.74 kg/m  BMI: Body mass index is 36.74 kg/m.           Wt Readings from Last 3 Encounters:  10/06/24 224 lb 3.2 oz (101.7 kg)  08/16/24 222 lb 6.4 oz (100.9 kg)  07/21/24 220 lb (99.8 kg)     GEN- The patient is well appearing, alert and oriented x 3 today.   Lungs- Clear to ausculation bilaterally, normal work of breathing.  Heart- Regular rate and rhythm, no murmurs, rubs or gallops Extremities- 1+ peripheral edema, warm, dry Skin-  device pocket well-healed, no tethering    Brief check performed without iterative lead testing/measurements Significant reduction in AFib burden     Studies Reviewed   Previous EP, cardiology notes.    EKG is ordered. Personal review of EKG from today shows:    EKG Interpretation Date/Time:  Thursday October 06 2024 10:03:15 EDT Ventricular Rate:  71 PR Interval:  268 QRS Duration:  86 QT Interval:  432 QTC Calculation: 469 R Axis:   38  Text Interpretation: Atrial-paced rhythm with prolonged AV conduction Low voltage QRS Confirmed by Vesper Trant [phone]) on 10/06/2024 10:05:25 AM    TTE, 01/11/2024 NORMAL LEFT VENTRICULAR SYSTOLIC FUNCTION WITH NO LVH  ESTIMATED EF: >55%  NORMAL LA PRESSURES WITH DIASTOLIC DYSFUNCTION (GRADE 1)  NORMAL RIGHT VENTRICULAR  SYSTOLIC FUNCTION  VALVULAR REGURGITATION: TRIVIAL AR, MILD MR, TRIVIAL PR, MODERATE TR  NO VALVULAR STENOSIS    Assessment and Plan     #) parox Afib #) amiodarone  monitoring Continues to be asymptomatic of arrhythmia Thinks her exercise capacity has improved some AFib burden significantly improved compared to pre-amiodarone   Continue 200mg  amiodarone  daily Update LFT, TSH in ~6 months   #) Hypercoag d/t afib CHA2DS2-VASc Score = at least 5 [CHF History: 0, HTN History: 1, Diabetes History: 1, Stroke History: 0, Vascular Disease History: 0,  Age Score: 2, Gender Score: 1].  Therefore, the patient's annual risk of stroke is 7.2 %.    Stroke ppx - 5mg  eliquis BID, appropriately dosed No bleeding concerns   #) SND, post-conversion pauses #) PPM in situ Recent remote transmission with stable lead measurements       Current medicines are reviewed at length with the patient today.   The patient does not have concerns regarding her medicines.  The following changes were made today:  none  Labs/ tests ordered today include:  Orders Placed This Encounter  Procedures   EKG 12-Lead     Disposition: Follow up with Dr. Cindie or EP APP in 6 months   Signed, Shalee Paolo, NP  10/06/24  12:01 PM  Electrophysiology [facility]

## 2024-10-06 ENCOUNTER — Encounter: Payer: Self-pay | Admitting: Cardiology

## 2024-10-06 ENCOUNTER — Ambulatory Visit: Attending: Cardiology | Admitting: Cardiology

## 2024-10-06 VITALS — BP 136/70 | HR 71 | Ht 65.5 in | Wt 224.2 lb

## 2024-10-06 DIAGNOSIS — I480 Paroxysmal atrial fibrillation: Secondary | ICD-10-CM | POA: Diagnosis not present

## 2024-10-06 DIAGNOSIS — Z5181 Encounter for therapeutic drug level monitoring: Secondary | ICD-10-CM | POA: Insufficient documentation

## 2024-10-06 DIAGNOSIS — D6869 Other thrombophilia: Secondary | ICD-10-CM | POA: Insufficient documentation

## 2024-10-06 DIAGNOSIS — Z79899 Other long term (current) drug therapy: Secondary | ICD-10-CM | POA: Diagnosis not present

## 2024-10-06 NOTE — Patient Instructions (Signed)
 Medication Instructions:  Your physician recommends that you continue on your current medications as directed. Please refer to the Current Medication list given to you today.   *If you need a refill on your cardiac medications before your next appointment, please call your pharmacy*  Lab Work: No labs ordered today  If you have labs (blood work) drawn today and your tests are completely normal, you will receive your results only by: MyChart Message (if you have MyChart) OR A paper copy in the mail If you have any lab test that is abnormal or we need to change your treatment, we will call you to review the results.  Testing/Procedures: No test ordered today   Follow-Up: At [facility], you and your health needs are our priority.  As part of our continuing mission to provide you with exceptional heart care, our providers are all part of one team.  This team includes your primary Cardiologist (physician) and Advanced Practice Providers or APPs (Physician Assistants and Nurse Practitioners) who all work together to provide you with the care you need, when you need it.  Your next appointment:   6 month(s)  Provider:   Suzann Riddle, NP    We recommend signing up for the patient portal called MyChart.  Sign up information is provided on this After Visit Summary.  MyChart is used to connect with patients for Virtual Visits (Telemedicine).  Patients are able to view lab/test results, encounter notes, upcoming appointments, etc.  Non-urgent messages can be sent to your provider as well.   To learn more about what you can do with MyChart, go to [email or web address].   Other Instructions When you see [facility] is they will check LFT, TSH and T4 and are feeling good then you can follow up with Suzann Riddle, NP in a year.

## 2024-10-14 ENCOUNTER — Other Ambulatory Visit: Payer: Self-pay | Admitting: Cardiology

## 2024-10-31 ENCOUNTER — Other Ambulatory Visit: Payer: Self-pay | Admitting: Physician Assistant

## 2024-10-31 DIAGNOSIS — E034 Atrophy of thyroid (acquired): Secondary | ICD-10-CM

## 2024-11-03 ENCOUNTER — Other Ambulatory Visit: Payer: Self-pay | Admitting: Physician Assistant

## 2024-11-03 DIAGNOSIS — E034 Atrophy of thyroid (acquired): Secondary | ICD-10-CM

## 2024-11-04 ENCOUNTER — Telehealth: Payer: Self-pay

## 2024-11-04 NOTE — Telephone Encounter (Signed)
 Copied from CRM #[id number]. Topic: Clinical - Medication Question >> Nov 04, 2024  8:57 AM Harlene ORN wrote: Reason for CRM: confirmed with patient that medication was sent to the pharmacy on 11/01/2024

## 2024-11-04 NOTE — Telephone Encounter (Signed)
 Levothyroxine  sent in 11/01/24

## 2024-11-05 ENCOUNTER — Other Ambulatory Visit: Payer: Self-pay | Admitting: Physician Assistant

## 2024-11-05 DIAGNOSIS — E1159 Type 2 diabetes mellitus with other circulatory complications: Secondary | ICD-10-CM

## 2024-11-12 NOTE — Progress Notes (Unsigned)
 Established patient visit  Patient: Wendy Barnes   DOB: May 02, 1941   83 y.o. Female  MRN: [id number] Visit Date: 11/15/2024  Today's healthcare provider: Jolynn Spencer, PA-C   No chief complaint on file.  Subjective       Discussed the use of AI scribe software for clinical note transcription with the patient, who gave verbal consent to proceed.  History of Present Illness  TSH is ever so slightly elevated, we'll continue to monitor it       08/16/2024    8:40 AM 05/19/2024    9:12 AM 12/31/2023    1:39 PM  Depression screen PHQ 2/9  Decreased Interest 0 0 0  Down, Depressed, Hopeless 0 0 0  PHQ - 2 Score 0 0 0  Altered sleeping 0 0   Tired, decreased energy 0 0   Change in appetite 0 0   Feeling bad or failure about yourself  0 0   Trouble concentrating 0 0   Moving slowly or fidgety/restless 0 0   Suicidal thoughts 0 0   PHQ-9 Score 0  0    Difficult doing work/chores Not difficult at all Not difficult at all      Data saved with a previous flowsheet row definition      08/16/2024    8:40 AM 05/19/2024    9:12 AM 12/31/2023    1:39 PM 10/21/2023    1:06 PM  GAD 7 : Generalized Anxiety Score  Nervous, Anxious, on Edge 0 0 0 0  Control/stop worrying 0 0 0 0  Worry too much - different things 0 0 0 0  Trouble relaxing 0 0 0 0  Restless 0 0 0 0  Easily annoyed or irritable 0 0 0 0  Afraid - awful might happen 0 0 0 0  Total GAD 7 Score 0 0 0 0  Anxiety Difficulty Not difficult at all Not difficult at all Not difficult at all Not difficult at all    Medications: Outpatient Medications Prior to Visit  Medication Sig   acetaminophen  (TYLENOL ) 500 MG tablet Take 500-1,000 mg by mouth every 6 (six) hours as needed (pain.).   amiodarone  (PACERONE ) 200 MG tablet TAKE 1 TABLET(200 MG) BY MOUTH TWICE DAILY FOR 2 WEEKS THEN TAKE 1 TABLET(200 MG) BY MOUTH DAILY   amLODipine  (NORVASC ) 10 MG tablet Take 1 tablet (10 mg total) by mouth daily.   apixaban (ELIQUIS) 5 MG TABS  tablet Take 5 mg by mouth every 12 (twelve) hours.   benazepril -hydrochlorthiazide (LOTENSIN  HCT) 20-12.5 MG tablet TAKE 1 TABLET BY MOUTH TWICE DAILY   Biotin 2500 MCG CAPS Take 2,500 mcg by mouth in the morning.   Coenzyme Q10 100 MG capsule Take 100 mg by mouth in the morning.   Cranberry-Vitamin C-Vitamin E (CRANBERRY PLUS VITAMIN C) 4200-20-3 MG-MG-UNIT CAPS Take 1 capsule by mouth in the morning.   furosemide  (LASIX ) 20 MG tablet Take 1 tablet (20 mg total) by mouth as needed for edema.   gabapentin  (NEURONTIN ) 100 MG capsule TAKE 1-3 CAPSULES PRIOR TO BED FOR NEUROPATHY   levocetirizine (XYZAL ) 5 MG tablet Take 5 mg by mouth daily. (Patient not taking: Reported on 10/06/2024)   levothyroxine  (SYNTHROID ) 88 MCG tablet TAKE 1 TABLET(88 MCG) BY MOUTH DAILY BEFORE BREAKFAST   metFORMIN  (GLUCOPHAGE ) 1000 MG tablet Take 2 tablets (2,000 mg total) by mouth daily with breakfast.   metoprolol  succinate (TOPROL -XL) 25 MG 24 hr tablet Take 1 tablet (25 mg total) by mouth in the  morning and at bedtime.   rosuvastatin  (CRESTOR ) 5 MG tablet TAKE 1 TABLET(5 MG) BY MOUTH DAILY   Vibegron  (GEMTESA ) 75 MG TABS Take 1 tablet (75 mg total) by mouth every other day. (Patient not taking: Reported on 10/06/2024)   No facility-administered medications prior to visit.    Review of Systems  All other systems reviewed and are negative.  All negative Except see HPI   {Insert previous labs (optional):23779} {See past labs  Heme  Chem  Endocrine  Serology  Results Review (optional):1}   Objective    There were no vitals taken for this visit. {Insert last BP/Wt (optional):23777}{See vitals history (optional):1}   Physical Exam Vitals reviewed.  Constitutional:      General: She is not in acute distress.    Appearance: Normal appearance. She is well-developed. She is not diaphoretic.  HENT:     Head: Normocephalic and atraumatic.  Eyes:     General: No scleral icterus.    Conjunctiva/sclera:  Conjunctivae normal.  Neck:     Thyroid : No thyromegaly.  Cardiovascular:     Rate and Rhythm: Normal rate and regular rhythm.     Pulses: Normal pulses.     Heart sounds: Normal heart sounds. No murmur heard. Pulmonary:     Effort: Pulmonary effort is normal. No respiratory distress.     Breath sounds: Normal breath sounds. No wheezing, rhonchi or rales.  Musculoskeletal:     Cervical back: Neck supple.     Right lower leg: No edema.     Left lower leg: No edema.  Lymphadenopathy:     Cervical: No cervical adenopathy.  Skin:    General: Skin is warm and dry.     Findings: No rash.  Neurological:     Mental Status: She is alert and oriented to person, place, and time. Mental status is at baseline.  Psychiatric:        Mood and Affect: Mood normal.        Behavior: Behavior normal.      No results found for any visits on 11/15/24.      Assessment and Plan Assessment & Plan     No orders of the defined types were placed in this encounter.   No follow-ups on file.   The patient was advised to call back or seek an in-person evaluation if the symptoms worsen or if the condition fails to improve as anticipated.  I discussed the assessment and treatment plan with the patient. The patient was provided an opportunity to ask questions and all were answered. The patient agreed with the plan and demonstrated an understanding of the instructions.  I, Haylie Mccutcheon, PA-C have reviewed all documentation for this visit. The documentation on 11/15/2024  for the exam, diagnosis, procedures, and orders are all accurate and complete.  Jolynn Spencer, [facility], MMS [facility] [phone] (phone) [phone] (fax)  [facility]

## 2024-11-15 ENCOUNTER — Encounter: Payer: Self-pay | Admitting: Physician Assistant

## 2024-11-15 ENCOUNTER — Ambulatory Visit: Admitting: Physician Assistant

## 2024-11-15 VITALS — BP 121/68 | HR 68 | Resp 14 | Ht 65.5 in | Wt 227.3 lb

## 2024-11-15 DIAGNOSIS — E1142 Type 2 diabetes mellitus with diabetic polyneuropathy: Secondary | ICD-10-CM | POA: Diagnosis not present

## 2024-11-15 DIAGNOSIS — E7800 Pure hypercholesterolemia, unspecified: Secondary | ICD-10-CM | POA: Diagnosis not present

## 2024-11-15 DIAGNOSIS — N3281 Overactive bladder: Secondary | ICD-10-CM | POA: Diagnosis not present

## 2024-11-15 DIAGNOSIS — E034 Atrophy of thyroid (acquired): Secondary | ICD-10-CM | POA: Diagnosis not present

## 2024-11-15 DIAGNOSIS — I152 Hypertension secondary to endocrine disorders: Secondary | ICD-10-CM | POA: Diagnosis not present

## 2024-11-15 DIAGNOSIS — I480 Paroxysmal atrial fibrillation: Secondary | ICD-10-CM | POA: Diagnosis not present

## 2024-11-15 DIAGNOSIS — E1159 Type 2 diabetes mellitus with other circulatory complications: Secondary | ICD-10-CM

## 2024-11-15 DIAGNOSIS — G4709 Other insomnia: Secondary | ICD-10-CM | POA: Diagnosis not present

## 2024-11-15 NOTE — Telephone Encounter (Signed)
 Noted

## 2024-11-16 DIAGNOSIS — E1159 Type 2 diabetes mellitus with other circulatory complications: Secondary | ICD-10-CM | POA: Diagnosis not present

## 2024-11-16 DIAGNOSIS — E034 Atrophy of thyroid (acquired): Secondary | ICD-10-CM | POA: Diagnosis not present

## 2024-11-16 DIAGNOSIS — E7800 Pure hypercholesterolemia, unspecified: Secondary | ICD-10-CM | POA: Diagnosis not present

## 2024-11-16 DIAGNOSIS — I152 Hypertension secondary to endocrine disorders: Secondary | ICD-10-CM | POA: Diagnosis not present

## 2024-11-16 DIAGNOSIS — E1142 Type 2 diabetes mellitus with diabetic polyneuropathy: Secondary | ICD-10-CM | POA: Diagnosis not present

## 2024-11-17 ENCOUNTER — Ambulatory Visit: Payer: Self-pay | Admitting: Physician Assistant

## 2024-11-17 DIAGNOSIS — E034 Atrophy of thyroid (acquired): Secondary | ICD-10-CM

## 2024-11-17 LAB — CBC WITH DIFFERENTIAL/PLATELET
Basophils Absolute: 0 x10E3/uL (ref 0.0–0.2)
Basos: 1 %
EOS (ABSOLUTE): 0.1 x10E3/uL (ref 0.0–0.4)
Eos: 2 %
Hematocrit: 37.3 % (ref 34.0–46.6)
Hemoglobin: 11.7 g/dL (ref 11.1–15.9)
Immature Grans (Abs): 0 x10E3/uL (ref 0.0–0.1)
Immature Granulocytes: 0 %
Lymphocytes Absolute: 2.2 x10E3/uL (ref 0.7–3.1)
Lymphs: 28 %
MCH: 28.8 pg (ref 26.6–33.0)
MCHC: 31.4 g/dL — ABNORMAL LOW (ref 31.5–35.7)
MCV: 92 fL (ref 79–97)
Monocytes Absolute: 0.5 x10E3/uL (ref 0.1–0.9)
Monocytes: 7 %
Neutrophils Absolute: 4.9 x10E3/uL (ref 1.4–7.0)
Neutrophils: 62 %
Platelets: 220 x10E3/uL (ref 150–450)
RBC: 4.06 x10E6/uL (ref 3.77–5.28)
RDW: 13.6 % (ref 11.7–15.4)
WBC: 7.9 x10E3/uL (ref 3.4–10.8)

## 2024-11-17 LAB — COMPREHENSIVE METABOLIC PANEL WITH GFR
ALT: 21 IU/L (ref 0–32)
AST: 18 IU/L (ref 0–40)
Albumin: 4.2 g/dL (ref 3.7–4.7)
Alkaline Phosphatase: 72 IU/L (ref 48–129)
BUN/Creatinine Ratio: 23 (ref 12–28)
BUN: 21 mg/dL (ref 8–27)
Bilirubin Total: 0.4 mg/dL (ref 0.0–1.2)
CO2: 24 mmol/L (ref 20–29)
Calcium: 10 mg/dL (ref 8.7–10.3)
Chloride: 104 mmol/L (ref 96–106)
Creatinine, Ser: 0.9 mg/dL (ref 0.57–1.00)
Globulin, Total: 2.3 g/dL (ref 1.5–4.5)
Glucose: 146 mg/dL — ABNORMAL HIGH (ref 70–99)
Potassium: 4.1 mmol/L (ref 3.5–5.2)
Sodium: 143 mmol/L (ref 134–144)
Total Protein: 6.5 g/dL (ref 6.0–8.5)
eGFR: 64 mL/min/1.73 (ref >59)

## 2024-11-17 LAB — LIPID PANEL
Chol/HDL Ratio: 2.6 ratio (ref 0.0–4.4)
Cholesterol, Total: 166 mg/dL (ref 100–199)
HDL: 65 mg/dL (ref >39)
LDL Chol Calc (NIH): 80 mg/dL (ref 0–99)
Triglycerides: 118 mg/dL (ref 0–149)
VLDL Cholesterol Cal: 21 mg/dL (ref 5–40)

## 2024-11-17 LAB — HEMOGLOBIN A1C
Est. average glucose Bld gHb Est-mCnc: 131 mg/dL
Hgb A1c MFr Bld: 6.2 % — ABNORMAL HIGH (ref 4.8–5.6)

## 2024-11-17 LAB — TSH: TSH: 6.79 u[IU]/mL — ABNORMAL HIGH (ref 0.450–4.500)

## 2024-11-17 MED ORDER — LEVOTHYROXINE SODIUM 100 MCG PO TABS: 100.0000 ug | ORAL_TABLET | Freq: Every day | ORAL | 3 refills | Status: AC

## 2024-11-17 NOTE — Telephone Encounter (Signed)
 Med was sent

## 2024-11-17 NOTE — Progress Notes (Signed)
You will  

## 2024-12-12 ENCOUNTER — Ambulatory Visit (INDEPENDENT_AMBULATORY_CARE_PROVIDER_SITE_OTHER): Payer: Medicare Other

## 2024-12-12 DIAGNOSIS — I495 Sick sinus syndrome: Secondary | ICD-10-CM | POA: Diagnosis not present

## 2024-12-13 LAB — CUP PACEART REMOTE DEVICE CHECK
Battery Remaining Longevity: 152 mo
Battery Voltage: 3.09 V
Brady Statistic AP VP Percent: 0.05 %
Brady Statistic AP VS Percent: 99.29 %
Brady Statistic AS VP Percent: 0 %
Brady Statistic AS VS Percent: 0.65 %
Brady Statistic RA Percent Paced: 98.86 %
Brady Statistic RV Percent Paced: 0.39 %
Date Time Interrogation Session: 20251228220303
Implantable Lead Connection Status: 753985
Implantable Lead Connection Status: 753985
Implantable Lead Implant Date: 20250214
Implantable Lead Implant Date: 20250214
Implantable Lead Location: 753859
Implantable Lead Location: 753860
Implantable Lead Model: 3830
Implantable Lead Model: 5076
Implantable Pulse Generator Implant Date: 20250214
Lead Channel Impedance Value: 342 Ohm
Lead Channel Impedance Value: 437 Ohm
Lead Channel Impedance Value: 437 Ohm
Lead Channel Impedance Value: 570 Ohm
Lead Channel Pacing Threshold Amplitude: 0.75 V
Lead Channel Pacing Threshold Amplitude: 1 V
Lead Channel Pacing Threshold Pulse Width: 0.4 ms
Lead Channel Pacing Threshold Pulse Width: 0.4 ms
Lead Channel Sensing Intrinsic Amplitude: 1.25 mV
Lead Channel Sensing Intrinsic Amplitude: 1.25 mV
Lead Channel Sensing Intrinsic Amplitude: 13.75 mV
Lead Channel Sensing Intrinsic Amplitude: 13.75 mV
Lead Channel Setting Pacing Amplitude: 1.5 V
Lead Channel Setting Pacing Amplitude: 1.5 V
Lead Channel Setting Pacing Pulse Width: 0.4 ms
Lead Channel Setting Sensing Sensitivity: 1.2 mV
Zone Setting Status: 755011
Zone Setting Status: 755011

## 2024-12-17 ENCOUNTER — Ambulatory Visit: Payer: Self-pay | Admitting: Cardiology

## 2024-12-19 ENCOUNTER — Other Ambulatory Visit: Payer: Self-pay | Admitting: Physician Assistant

## 2024-12-19 DIAGNOSIS — E7800 Pure hypercholesterolemia, unspecified: Secondary | ICD-10-CM

## 2024-12-21 NOTE — Progress Notes (Signed)
 Remote PPM Transmission

## 2024-12-30 ENCOUNTER — Other Ambulatory Visit: Payer: Self-pay | Admitting: Physician Assistant

## 2024-12-30 DIAGNOSIS — N3946 Mixed incontinence: Secondary | ICD-10-CM

## 2025-03-01 ENCOUNTER — Ambulatory Visit: Admitting: Physician Assistant

## 2025-03-16 ENCOUNTER — Ambulatory Visit: Admitting: Physician Assistant

## 2025-08-29 ENCOUNTER — Ambulatory Visit: Admitting: Dermatology
